# Patient Record
Sex: Female | Born: 1945
Health system: Southern US, Community
[De-identification: ages and names within clinical notes are randomized; demographics above are authoritative.]

## PROBLEM LIST (undated history)

## (undated) DIAGNOSIS — N6459 Other signs and symptoms in breast: Secondary | ICD-10-CM

## (undated) DIAGNOSIS — K5790 Diverticulosis of intestine, part unspecified, without perforation or abscess without bleeding: Secondary | ICD-10-CM

## (undated) DIAGNOSIS — S22020A Wedge compression fracture of second thoracic vertebra, initial encounter for closed fracture: Secondary | ICD-10-CM

## (undated) DIAGNOSIS — Z780 Asymptomatic menopausal state: Secondary | ICD-10-CM

## (undated) DIAGNOSIS — T7840XA Allergy, unspecified, initial encounter: Secondary | ICD-10-CM

## (undated) DIAGNOSIS — R002 Palpitations: Secondary | ICD-10-CM

## (undated) DIAGNOSIS — M519 Unspecified thoracic, thoracolumbar and lumbosacral intervertebral disc disorder: Secondary | ICD-10-CM

## (undated) DIAGNOSIS — S32000A Wedge compression fracture of unspecified lumbar vertebra, initial encounter for closed fracture: Secondary | ICD-10-CM

## (undated) DIAGNOSIS — M810 Age-related osteoporosis without current pathological fracture: Secondary | ICD-10-CM

## (undated) DIAGNOSIS — M1990 Unspecified osteoarthritis, unspecified site: Secondary | ICD-10-CM

## (undated) DIAGNOSIS — K921 Melena: Secondary | ICD-10-CM

## (undated) DIAGNOSIS — E079 Disorder of thyroid, unspecified: Secondary | ICD-10-CM

## (undated) DIAGNOSIS — G5601 Carpal tunnel syndrome, right upper limb: Secondary | ICD-10-CM

## (undated) HISTORY — DX: Palpitations: R00.2

## (undated) HISTORY — DX: Unspecified osteoarthritis, unspecified site: M19.90

## (undated) HISTORY — DX: Other signs and symptoms in breast: N64.59

## (undated) HISTORY — DX: Allergy, unspecified, initial encounter: T78.40XA

## (undated) HISTORY — DX: Asymptomatic menopausal state: Z78.0

## (undated) HISTORY — DX: Carpal tunnel syndrome, right upper limb: G56.01

## (undated) HISTORY — DX: Disorder of thyroid, unspecified: E07.9

## (undated) HISTORY — DX: Age-related osteoporosis without current pathological fracture: M81.0

## (undated) HISTORY — DX: Diverticulosis of intestine, part unspecified, without perforation or abscess without bleeding: K57.90

## (undated) HISTORY — DX: Melena: K92.1

## (undated) HISTORY — DX: Unspecified thoracic, thoracolumbar and lumbosacral intervertebral disc disorder: M51.9

## (undated) HISTORY — PX: OTHER SURGICAL HISTORY: SHX169

---
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
## 1981-08-18 HISTORY — PX: AUGMENTATION MAMMAPLASTY: SUR837

## 1994-08-18 HISTORY — PX: CERVICAL BIOPSY  W/ LOOP ELECTRODE EXCISION: SUR135

## 1998-07-27 ENCOUNTER — Other Ambulatory Visit: Admission: RE | Admit: 1998-07-27 | Discharge: 1998-07-27 | Payer: Self-pay | Admitting: Obstetrics and Gynecology

## 1998-07-30 ENCOUNTER — Other Ambulatory Visit: Admission: RE | Admit: 1998-07-30 | Discharge: 1998-07-30 | Payer: Self-pay | Admitting: Obstetrics and Gynecology

## 1998-08-18 DIAGNOSIS — M519 Unspecified thoracic, thoracolumbar and lumbosacral intervertebral disc disorder: Secondary | ICD-10-CM

## 1998-08-18 HISTORY — DX: Unspecified thoracic, thoracolumbar and lumbosacral intervertebral disc disorder: M51.9

## 1998-12-17 DIAGNOSIS — N6459 Other signs and symptoms in breast: Secondary | ICD-10-CM

## 1998-12-17 HISTORY — DX: Other signs and symptoms in breast: N64.59

## 1999-05-28 ENCOUNTER — Encounter: Admission: RE | Admit: 1999-05-28 | Discharge: 1999-05-28 | Payer: Self-pay | Admitting: Family Medicine

## 1999-06-03 ENCOUNTER — Encounter: Payer: Self-pay | Admitting: Neurosurgery

## 1999-06-03 ENCOUNTER — Ambulatory Visit (HOSPITAL_COMMUNITY): Admission: RE | Admit: 1999-06-03 | Discharge: 1999-06-03 | Payer: Self-pay | Admitting: Neurosurgery

## 1999-06-18 ENCOUNTER — Encounter: Payer: Self-pay | Admitting: Neurosurgery

## 1999-06-18 ENCOUNTER — Ambulatory Visit (HOSPITAL_COMMUNITY): Admission: RE | Admit: 1999-06-18 | Discharge: 1999-06-18 | Payer: Self-pay | Admitting: Neurosurgery

## 1999-07-02 ENCOUNTER — Ambulatory Visit (HOSPITAL_COMMUNITY): Admission: RE | Admit: 1999-07-02 | Discharge: 1999-07-02 | Payer: Self-pay | Admitting: Neurosurgery

## 1999-09-03 ENCOUNTER — Other Ambulatory Visit: Admission: RE | Admit: 1999-09-03 | Discharge: 1999-09-03 | Payer: Self-pay | Admitting: Obstetrics and Gynecology

## 2000-09-08 ENCOUNTER — Other Ambulatory Visit: Admission: RE | Admit: 2000-09-08 | Discharge: 2000-09-08 | Payer: Self-pay | Admitting: Obstetrics and Gynecology

## 2001-08-18 DIAGNOSIS — K5790 Diverticulosis of intestine, part unspecified, without perforation or abscess without bleeding: Secondary | ICD-10-CM

## 2001-08-18 HISTORY — DX: Diverticulosis of intestine, part unspecified, without perforation or abscess without bleeding: K57.90

## 2001-09-08 ENCOUNTER — Other Ambulatory Visit: Admission: RE | Admit: 2001-09-08 | Discharge: 2001-09-08 | Payer: Self-pay | Admitting: Obstetrics and Gynecology

## 2002-06-22 ENCOUNTER — Encounter: Payer: Self-pay | Admitting: Obstetrics and Gynecology

## 2002-06-22 ENCOUNTER — Encounter: Admission: RE | Admit: 2002-06-22 | Discharge: 2002-06-22 | Payer: Self-pay | Admitting: Obstetrics and Gynecology

## 2002-09-07 ENCOUNTER — Other Ambulatory Visit: Admission: RE | Admit: 2002-09-07 | Discharge: 2002-09-07 | Payer: Self-pay | Admitting: Obstetrics and Gynecology

## 2002-09-22 ENCOUNTER — Encounter: Admission: RE | Admit: 2002-09-22 | Discharge: 2002-09-22 | Payer: Self-pay | Admitting: Obstetrics and Gynecology

## 2002-09-22 ENCOUNTER — Encounter: Payer: Self-pay | Admitting: Obstetrics and Gynecology

## 2003-02-15 ENCOUNTER — Encounter: Payer: Self-pay | Admitting: Obstetrics and Gynecology

## 2003-02-15 ENCOUNTER — Encounter: Admission: RE | Admit: 2003-02-15 | Discharge: 2003-02-15 | Payer: Self-pay | Admitting: Obstetrics and Gynecology

## 2003-08-29 ENCOUNTER — Encounter: Admission: RE | Admit: 2003-08-29 | Discharge: 2003-08-29 | Payer: Self-pay | Admitting: Obstetrics and Gynecology

## 2003-09-08 ENCOUNTER — Other Ambulatory Visit: Admission: RE | Admit: 2003-09-08 | Discharge: 2003-09-08 | Payer: Self-pay | Admitting: Obstetrics and Gynecology

## 2004-03-13 ENCOUNTER — Encounter: Admission: RE | Admit: 2004-03-13 | Discharge: 2004-03-13 | Payer: Self-pay | Admitting: Obstetrics and Gynecology

## 2004-09-27 ENCOUNTER — Other Ambulatory Visit: Admission: RE | Admit: 2004-09-27 | Discharge: 2004-09-27 | Payer: Self-pay | Admitting: Obstetrics and Gynecology

## 2005-03-25 ENCOUNTER — Encounter: Admission: RE | Admit: 2005-03-25 | Discharge: 2005-03-25 | Payer: Self-pay | Admitting: Obstetrics and Gynecology

## 2005-08-18 LAB — HM COLONOSCOPY: HM Colonoscopy: 1

## 2005-11-07 ENCOUNTER — Other Ambulatory Visit: Admission: RE | Admit: 2005-11-07 | Discharge: 2005-11-07 | Payer: Self-pay | Admitting: Obstetrics and Gynecology

## 2006-03-27 ENCOUNTER — Encounter: Admission: RE | Admit: 2006-03-27 | Discharge: 2006-03-27 | Payer: Self-pay | Admitting: Obstetrics and Gynecology

## 2006-03-27 ENCOUNTER — Encounter: Payer: Self-pay | Admitting: Internal Medicine

## 2006-11-11 ENCOUNTER — Other Ambulatory Visit: Admission: RE | Admit: 2006-11-11 | Discharge: 2006-11-11 | Payer: Self-pay | Admitting: Obstetrics and Gynecology

## 2006-11-25 ENCOUNTER — Ambulatory Visit: Payer: Self-pay | Admitting: Internal Medicine

## 2006-12-11 ENCOUNTER — Encounter: Payer: Self-pay | Admitting: Internal Medicine

## 2006-12-11 ENCOUNTER — Ambulatory Visit: Payer: Self-pay | Admitting: Internal Medicine

## 2006-12-11 ENCOUNTER — Encounter (INDEPENDENT_AMBULATORY_CARE_PROVIDER_SITE_OTHER): Payer: Self-pay | Admitting: Specialist

## 2007-04-12 ENCOUNTER — Encounter: Payer: Self-pay | Admitting: Internal Medicine

## 2007-04-12 ENCOUNTER — Encounter: Admission: RE | Admit: 2007-04-12 | Discharge: 2007-04-12 | Payer: Self-pay | Admitting: Obstetrics and Gynecology

## 2007-08-19 HISTORY — PX: OTHER SURGICAL HISTORY: SHX169

## 2007-11-29 ENCOUNTER — Other Ambulatory Visit: Admission: RE | Admit: 2007-11-29 | Discharge: 2007-11-29 | Payer: Self-pay | Admitting: Obstetrics and Gynecology

## 2008-01-19 ENCOUNTER — Other Ambulatory Visit: Admission: RE | Admit: 2008-01-19 | Discharge: 2008-01-19 | Payer: Self-pay | Admitting: Family Medicine

## 2008-03-15 ENCOUNTER — Encounter: Payer: Self-pay | Admitting: Internal Medicine

## 2008-03-16 ENCOUNTER — Emergency Department (HOSPITAL_BASED_OUTPATIENT_CLINIC_OR_DEPARTMENT_OTHER): Admission: EM | Admit: 2008-03-16 | Discharge: 2008-03-16 | Payer: Self-pay | Admitting: Emergency Medicine

## 2008-03-21 ENCOUNTER — Emergency Department (HOSPITAL_BASED_OUTPATIENT_CLINIC_OR_DEPARTMENT_OTHER): Admission: EM | Admit: 2008-03-21 | Discharge: 2008-03-21 | Payer: Self-pay | Admitting: Emergency Medicine

## 2008-04-17 ENCOUNTER — Encounter: Admission: RE | Admit: 2008-04-17 | Discharge: 2008-04-17 | Payer: Self-pay | Admitting: Obstetrics and Gynecology

## 2008-04-17 ENCOUNTER — Encounter: Payer: Self-pay | Admitting: Internal Medicine

## 2008-08-03 ENCOUNTER — Emergency Department (HOSPITAL_BASED_OUTPATIENT_CLINIC_OR_DEPARTMENT_OTHER): Admission: EM | Admit: 2008-08-03 | Discharge: 2008-08-03 | Payer: Self-pay | Admitting: Emergency Medicine

## 2008-08-03 ENCOUNTER — Ambulatory Visit: Payer: Self-pay | Admitting: Interventional Radiology

## 2008-08-18 HISTORY — PX: BREAST SURGERY: SHX581

## 2008-09-06 ENCOUNTER — Ambulatory Visit (HOSPITAL_COMMUNITY): Admission: RE | Admit: 2008-09-06 | Discharge: 2008-09-06 | Payer: Self-pay | Admitting: Orthopedic Surgery

## 2008-11-29 ENCOUNTER — Other Ambulatory Visit: Admission: RE | Admit: 2008-11-29 | Discharge: 2008-11-29 | Payer: Self-pay | Admitting: Obstetrics and Gynecology

## 2009-04-18 ENCOUNTER — Encounter: Admission: RE | Admit: 2009-04-18 | Discharge: 2009-04-18 | Payer: Self-pay | Admitting: Obstetrics and Gynecology

## 2009-04-18 ENCOUNTER — Encounter: Payer: Self-pay | Admitting: Internal Medicine

## 2009-11-24 ENCOUNTER — Encounter: Admission: RE | Admit: 2009-11-24 | Discharge: 2009-11-24 | Payer: Self-pay | Admitting: Orthopedic Surgery

## 2009-12-04 LAB — CONVERTED CEMR LAB: Pap Smear: NORMAL

## 2010-01-23 ENCOUNTER — Ambulatory Visit (HOSPITAL_BASED_OUTPATIENT_CLINIC_OR_DEPARTMENT_OTHER): Admission: RE | Admit: 2010-01-23 | Discharge: 2010-01-23 | Payer: Self-pay | Admitting: Orthopedic Surgery

## 2010-02-20 ENCOUNTER — Encounter: Admission: RE | Admit: 2010-02-20 | Discharge: 2010-02-28 | Payer: Self-pay | Admitting: Orthopedic Surgery

## 2010-03-15 ENCOUNTER — Ambulatory Visit: Payer: Self-pay | Admitting: Internal Medicine

## 2010-03-15 DIAGNOSIS — M949 Disorder of cartilage, unspecified: Secondary | ICD-10-CM

## 2010-03-15 DIAGNOSIS — M899 Disorder of bone, unspecified: Secondary | ICD-10-CM | POA: Insufficient documentation

## 2010-03-18 ENCOUNTER — Encounter: Payer: Self-pay | Admitting: Internal Medicine

## 2010-04-03 ENCOUNTER — Encounter: Payer: Self-pay | Admitting: Internal Medicine

## 2010-05-03 ENCOUNTER — Encounter: Admission: RE | Admit: 2010-05-03 | Discharge: 2010-05-03 | Payer: Self-pay | Admitting: Obstetrics and Gynecology

## 2010-07-19 ENCOUNTER — Ambulatory Visit: Payer: Self-pay | Admitting: Internal Medicine

## 2010-07-30 ENCOUNTER — Encounter
Admission: RE | Admit: 2010-07-30 | Discharge: 2010-08-13 | Payer: Self-pay | Source: Home / Self Care | Attending: Specialist | Admitting: Specialist

## 2010-07-30 ENCOUNTER — Ambulatory Visit: Payer: Self-pay | Admitting: Internal Medicine

## 2010-07-30 DIAGNOSIS — H6980 Other specified disorders of Eustachian tube, unspecified ear: Secondary | ICD-10-CM | POA: Insufficient documentation

## 2010-08-13 ENCOUNTER — Telehealth: Payer: Self-pay | Admitting: Internal Medicine

## 2010-08-16 ENCOUNTER — Telehealth: Payer: Self-pay | Admitting: Internal Medicine

## 2010-08-16 ENCOUNTER — Ambulatory Visit (HOSPITAL_BASED_OUTPATIENT_CLINIC_OR_DEPARTMENT_OTHER)
Admission: RE | Admit: 2010-08-16 | Discharge: 2010-08-16 | Payer: Self-pay | Source: Home / Self Care | Attending: Internal Medicine | Admitting: Internal Medicine

## 2010-08-16 ENCOUNTER — Telehealth: Payer: Self-pay | Admitting: Family

## 2010-08-16 ENCOUNTER — Ambulatory Visit
Admission: RE | Admit: 2010-08-16 | Discharge: 2010-08-16 | Payer: Self-pay | Source: Home / Self Care | Attending: Family | Admitting: Family

## 2010-08-16 DIAGNOSIS — R05 Cough: Secondary | ICD-10-CM

## 2010-08-18 HISTORY — PX: KNEE SURGERY: SHX244

## 2010-09-08 ENCOUNTER — Encounter: Payer: Self-pay | Admitting: Obstetrics and Gynecology

## 2010-09-17 NOTE — Miscellaneous (Signed)
Summary: Bone Density Results  Clinical Lists Changes  Observations: Added new observation of BONE DENSITY: Osteopenia (03/18/2010 15:11)      Preventive Care Screening  Bone Density:    Date:  03/18/2010    Results:  Osteopenia

## 2010-09-17 NOTE — Assessment & Plan Note (Signed)
Summary: TO EST  /CPX/HEA--Rm 2   Vital Signs:  Patient profile:   65 year old female Height:      67.5 inches Weight:      155 pounds BMI:     24.00 O2 Sat:      100 % on Room air Temp:     97.7 degrees F oral Pulse rate:   70 / minute Pulse rhythm:   regular Resp:     16 per minute BP sitting:   118 / 80  (right arm) Cuff size:   regular  Vitals Entered By: [name] CMA (March 15, 2010 9:20 AM)  O2 Flow:  Room air CC: Rm 2--New Patient  Is Patient Diabetic? No Pain Assessment Patient in pain? no       Does patient need assistance? Functional Status Self care Ambulation Normal   Primary Care Provider:  [name]  CC:  Rm 2--New Patient .  History of Present Illness: 65 y/o female to establish and for routine cpx.  Dr. Janey Greaser was prev PCP on HRT - since age 75  she reports hx of fluid in right ear.   prev saw ENT and had tube placed.  no follow up   Preventive Screening-Counseling & Management  Alcohol-Tobacco     Alcohol drinks/day: 2     Alcohol type: wine     Smoking Status: quit     Packs/Day: 0.5     Year Started: 1967     Year Quit: 1980  Caffeine-Diet-Exercise     Caffeine use/day: 2 beverages daily     Does Patient Exercise: no     Times/week: 5  Allergies (verified): No Known Drug Allergies  Past History:  Past Medical History: L4 vertebrae compression fx - in her 40's L2 vertebral compression fx 2009 Osteopenia - prev on fosamax (not sure how long she took fosamax) stopped fosamax - 4-5 yrs ago    Past Surgical History: Bilateral breast implants removed 2010 Right knee repair 01/23/2010  - meniscal repair Tonsillectomy    Family History: Family History of Arthritis Family History of CAD Female 1st degree relative <50 Family History of Colon CA 1st degree relative <60 Family History High cholesterol Family History Hypertension mother - hx of alzheimer, osteoporosis sister - breast ca    Social History: Retired -  from [employer] Married- 24 years  (Husband - Vrinda Heckstall) no children- 2 step daughters originally from  - [place], [place] prev lived [place] prev smoker -  (on and off) 10 yrs  alcohol- 2 glasses of wine per day   Smoking Status:  quit Packs/Day:  0.5 Caffeine use/day:  2 beverages daily Does Patient Exercise:  no  Review of Systems  The patient denies fever, weight loss, weight gain, chest pain, syncope, dyspnea on exertion, prolonged cough, abdominal pain, melena, hematochezia, severe indigestion/heartburn, and depression.    Physical Exam  General:  alert, well-developed, and well-nourished.   Head:  normocephalic and atraumatic.   Ears:  right ear tube, right cerumen  Mouth:  pharynx pink and moist.   Neck:  No deformities, masses, or tenderness noted. Lungs:  normal respiratory effort, normal breath sounds, no crackles, and no wheezes.   Heart:  normal rate, regular rhythm, and no gallop.   Abdomen:  soft, non-tender, normal bowel sounds, no masses, no hepatomegaly, and no splenomegaly.   Extremities:  No lower extremity edema Neurologic:  cranial nerves II-XII intact and gait normal.   Psych:  normally  interactive, good eye contact, not anxious appearing, and not depressed appearing.     Impression & Recommendations:  Problem # 1:  HEALTH MAINTENANCE EXAM (ICD-V70.0) Reviewed adult health maintenance protocols.  Mammogram: normal (04/18/2009) Pap smear: normal (12/04/2009) Colonoscopy: abnormal (12/11/2006) Bone Density: normal (03/15/2008) Td Booster: Historical (03/16/2008)   Flu Vax: Historical (07/03/2009)     Problem # 2:  OSTEOPENIA (ICD-733.90)  Complete Medication List: 1)  Prempro 0.625-2.5 Mg Tabs (Conj estrog-medroxyprogest ace) .... Take 1 tablet by mouth once a day 2)  Ibuprofen 200 Mg Tabs (Ibuprofen) .... 3 tablets by mouth two times a day as needed knee pain 3)  Calcium-vitamin D3 600-200 Mg-unit Tabs (Calcium carbonate-vitamin d) ....  Take 1 tablet by mouth two times a day 4)  Centrum Tabs (Multiple vitamins-minerals) .... Take 1 tablet by mouth once a day 5)  Vitamin D3 2000 Unit Tabs (Cholecalciferol) .... Take 1 tablet by mouth once a day  Patient Instructions: 1)  Please schedule a follow-up appointment in 1 year.   Preventive Care Screening  Pap Smear:    Date:  12/04/2009    Results:  normal   Mammogram:    Date:  04/18/2009    Results:  normal   Last Tetanus Booster:    Date:  03/16/2008    Results:  Historical   Bone Density:    Date:  03/15/2008    Results:  normal std dev  Colonoscopy:    Date:  12/11/2006    Results:  abnormal     Immunization History:  Zostavax History:    Zostavax # 1:  zostavax (12/30/2005)  Influenza Immunization History:    Influenza:  historical (07/03/2009)    Current Allergies (reviewed today): No known allergies

## 2010-09-17 NOTE — Procedures (Signed)
Summary: Colonoscopy Report/[facility]  Colonoscopy Report/[facility]   Imported By: Maryln Gottron 03/26/2010 13:34:04  _____________________________________________________________________  External Attachment:    Type:   Image     Comment:   External Document

## 2010-09-19 NOTE — Assessment & Plan Note (Signed)
Summary: Ear feels full /hea   Vital Signs:  Patient profile:   65 year old female Weight:      157 pounds BMI:     24.31 O2 Sat:      100 % on Room air Temp:     98.1 degrees F oral Pulse rate:   72 / minute Resp:     18 per minute BP sitting:   120 / 80  (left arm) Cuff size:   regular  Vitals Entered By: [name] CMA (July 30, 2010 11:41 AM)  O2 Flow:  Room air CC: Sinus Congestion Is Patient Diabetic? No Pain Assessment Patient in pain? no      Comments c/o right ear pressure, sinus congestion, feels better, still has throat irritation from nasal drainage , denies temp, and states she has not felt feverish   Primary Care Provider:  [name]  CC:  Sinus Congestion.  History of Present Illness: 65 y/o white female for f/u recent cough and congestion is much better but has residual right ear sense of fullness prev his of chronic right ear infections seen by Dr. Jenne Pane in the past - tried tubes  Preventive Screening-Counseling & Management  Alcohol-Tobacco     Smoking Status: quit  Allergies (verified): No Known Drug Allergies  Past History:  Past Medical History: L4 vertebrae compression fx - in her 40's L2 vertebral compression fx 2009 Osteopenia - prev on fosamax (not sure how long she took fosamax) stopped fosamax - 4-5 yrs ago    Hx of chronic right ear infection - Dr. Jenne Pane  Past Surgical History: Bilateral breast implants removed 2010 Right knee repair 01/23/2010  - meniscal repair Tonsillectomy      Family History: Family History of Arthritis Family History of CAD Female 1st degree relative <50 Family History of Colon CA 1st degree relative <60 Family History High cholesterol Family History Hypertension mother - hx of alzheimer, osteoporosis sister - breast ca     Social History: Retired - from [employer] Married- 24 years  (Husband - Ann Vang) no children- 2 step daughters originally from  - [place],  [place] prev lived [place] prev smoker -  (on and off) 10 yrs  alcohol- 2 glasses of wine per day     Physical Exam  General:  alert, well-developed, and well-nourished.   Eyes:  pupils equal, pupils round, and pupils reactive to light.   Ears:  right TM dull, scarred and retracted Lungs:  normal respiratory effort, normal breath sounds, no crackles, and no wheezes.   Heart:  normal rate, regular rhythm, and no gallop.     Impression & Recommendations:  Problem # 1:  DYSFUNCTION OF EUSTACHIAN TUBE (ICD-381.81) use nose sprays and decongestants x 2 weeks she declines referral back to ENT   she has some hearing loss of right ear she will consider hearing aide if hearing gets worse but left ear hearing seems to be normal  Complete Medication List: 1)  Prempro 0.625-2.5 Mg Tabs (Conj estrog-medroxyprogest ace) .... Take 1 tablet by mouth once a day 2)  Calcium-vitamin D3 600-200 Mg-unit Tabs (Calcium carbonate-vitamin d) .... Take 1 tablet by mouth two times a day 3)  Centrum Tabs (Multiple vitamins-minerals) .... Take 1 tablet by mouth once a day 4)  Vitamin D3 2000 Unit Tabs (Cholecalciferol) .... Take 1 tablet by mouth once a day 5)  Hydrocodone-homatropine 5-1.5 Mg/39ml Syrp (Hydrocodone-homatropine) .... 5 ml by mouth two times a day as needed for  cough 6)  Fluticasone Propionate 50 Mcg/act Susp (Fluticasone propionate) .... 2 sprays each nostril once daily 7)  Azelastine Hcl 137 Mcg/spray Soln (Azelastine hcl) .... 2 sprays each nostril two times a day  Patient Instructions: 1)  Call our office if your symptoms do not  improve or gets worse. Prescriptions: AZELASTINE HCL 137 MCG/SPRAY SOLN (AZELASTINE HCL) 2 sprays each nostril two times a day  #1 x 3   Entered and Authorized by:   [name]   Signed by:   [name] on 07/30/2010   Method used:   Electronically to        [id number] (retail)       [address] [place], [place]  [address]       Ph: [phone]       Fax: [phone]   RxID:   [id number] FLUTICASONE PROPIONATE 50 MCG/ACT SUSP (FLUTICASONE PROPIONATE) 2 sprays each nostril once daily  #1 x 3   Entered and Authorized by:   [name]   Signed by:   [name] on 07/30/2010   Method used:   Electronically to        [id number] (retail)       [address] [place], [place]  [address]       Ph: [phone]       Fax: [phone]   RxID:   [id number]    Orders Added: 1)  Est. Patient Level III [88416]    Current Allergies (reviewed today): No known allergies

## 2010-09-19 NOTE — Progress Notes (Signed)
Summary: Unresolved Cough  Phone Note Call from Patient Call back at Home Phone [phone]   Caller: Patient Call For: [name] Summary of Call: patient called and left voice message stating she still has a sore throat and couhg, she would like to know if she could be seen this am by [facility].  Call was returned to patient at [phone], she states she still has a cough, congestion and sore throat, and feels she need to be seen. She has been provided a appointment for 10:30 this am Initial call taken by: [name] CMA,  August 16, 2010 8:36 AM

## 2010-09-19 NOTE — Progress Notes (Signed)
Summary: CXR results  Phone Note Outgoing Call   Summary of Call: Please call patient and notify her that her chest x-ray is negative. Initial call taken by: Lemont Fillers FNP,  August 16, 2010 11:44 AM  Follow-up for Phone Call        call placed to patient at [phone], no answer. A detailed voice message was left informing patient per [facility] instructions Follow-up by: [name] CMA,  August 16, 2010 11:57 AM

## 2010-09-19 NOTE — Assessment & Plan Note (Signed)
Summary: COUGH CONGESTION/M[facility]   Vital Signs:  Patient profile:   65 year old female Height:      67.5 inches Weight:      157.50 pounds BMI:     24.39 O2 Sat:      100 % on Room air Temp:     98.0 degrees F oral Pulse rate:   78 / minute Resp:     18 per minute BP sitting:   124 / 70  (right arm) Cuff size:   regular  Vitals Entered By: [name] CMA (July 19, 2010 1:57 PM)  O2 Flow:  Room air CC: Chest congestion/ Cough Is Patient Diabetic? No Pain Assessment Patient in pain? no        Primary Care Henretta Quist:  [name]  CC:  Chest congestion/ Cough.  History of Present Illness: onset about 10 days ago with sore throat , right ear congestion, took claritan D fo rthe past 7 days, 2 days ago onset  of cough productive green in color, cough is worse at night- not sure if allergy related, has not checked temp and did not feel feverish, intermittent headache, exhaustion throughout day  Preventive Screening-Counseling & Management  Alcohol-Tobacco     Smoking Status: quit  Allergies (verified): No Known Drug Allergies  Past History:  Past Medical History: L4 vertebrae compression fx - in her 40's L2 vertebral compression fx 2009 Osteopenia - prev on fosamax (not sure how long she took fosamax) stopped fosamax - 4-5 yrs ago     Physical Exam  General:  alert, well-developed, and well-nourished.   Ears:  R ear normal and L ear normal.   Mouth:  pharyngeal erythema.   Lungs:  normal respiratory effort and slight coarse breath sounds Heart:  normal rate, regular rhythm, and no gallop.     Impression & Recommendations:  Problem # 1:  BRONCHITIS, ACUTE (ICD-466.0)  Her updated medication list for this problem includes:    Hydrocodone-homatropine 5-1.5 Mg/31ml Syrp (Hydrocodone-homatropine) .Marland KitchenMarland KitchenMarland KitchenMarland Kitchen 5 ml by mouth two times a day as needed for cough  Take antibiotics and other medications as directed. Encouraged to push clear liquids, get enough rest,  and take acetaminophen as needed. To be seen in 5-7 days if no improvement, sooner if worse.  Complete Medication List: 1)  Prempro 0.625-2.5 Mg Tabs (Conj estrog-medroxyprogest ace) .... Take 1 tablet by mouth once a day 2)  Calcium-vitamin D3 600-200 Mg-unit Tabs (Calcium carbonate-vitamin d) .... Take 1 tablet by mouth two times a day 3)  Centrum Tabs (Multiple vitamins-minerals) .... Take 1 tablet by mouth once a day 4)  Vitamin D3 2000 Unit Tabs (Cholecalciferol) .... Take 1 tablet by mouth once a day 5)  Hydrocodone-homatropine 5-1.5 Mg/45ml Syrp (Hydrocodone-homatropine) .... 5 ml by mouth two times a day as needed for cough  Patient Instructions: 1)  Call our office if your symptoms do not  improve or gets worse. Prescriptions: HYDROCODONE-HOMATROPINE 5-1.5 MG/5ML SYRP (HYDROCODONE-HOMATROPINE) 5 ml by mouth two times a day as needed for cough  #90 ml x 0   Entered and Authorized by:   [name]   Signed by:   [name] on 07/19/2010   Method used:   Print then Give to Patient   RxID:   [id number] PREDNISONE 10 MG TABS (PREDNISONE) 3 tabs by mouth once daily x 3 days, 2 tabs by mouth once daily x 3 days, 1 tab by mouth once daily x 3 days  #  18 x 0   Entered and Authorized by:   [name]   Signed by:   [name] on 07/19/2010   Method used:   Electronically to        [id number] (retail)       [address] [place], [place]  [address]       Ph: [phone]       Fax: [phone]   RxID:   [id number] CEFUROXIME AXETIL 500 MG TABS (CEFUROXIME AXETIL) one by mouth two times a day  #14 x 0   Entered and Authorized by:   [name]   Signed by:   [name] on 07/19/2010   Method used:   Electronically to        [id number] (retail)       [address] [place], [place]  [address]       Ph: [phone]       Fax: [phone]   RxID:    [id number]    Orders Added: 1)  Est. Patient Level III [88416]    Current Allergies (reviewed today): No known allergies

## 2010-09-19 NOTE — Assessment & Plan Note (Signed)
Summary: COUGH & CONGESTION/DK--Rm 3   Vital Signs:  Patient profile:   65 year old female Height:      67.5 inches Weight:      157.25 pounds BMI:     24.35 O2 Sat:      100 % on Room air Temp:     97.9 degrees F oral Pulse rate:   78 / minute Pulse rhythm:   regular Resp:     18 per minute BP sitting:   122 / 88  (right arm) Cuff size:   regular  Vitals Entered By: Mervin Kung CMA Duncan Dull) (August 16, 2010 10:25 AM)  O2 Flow:  Room air CC: Pt states she has had burning in her mouth and throat x 1 day. Cough x 1 month and getting worse. Is Patient Diabetic? No Pain Assessment Patient in pain? no      Comments Pt has right ear fullness and intermittent pain. Pt has completed Hydrocodone-Homatropine. Pt agrees all other med doses and directions are correct. Nicki Guadalajara Fergerson CMA Duncan Dull)  August 16, 2010 10:33 AM    Primary Care Provider:  [name]  CC:  Pt states she has had burning in her mouth and throat x 1 day. Cough x 1 month and getting worse.Marland Kitchen  History of Present Illness: Ms.  Fitzhenry is a 65 year old female who presents with chief complaint of cough.   Symptoms started 1 month ago.  Initially treated with cephalosporin which helped a bit with her cough.   Cough is dry. Denies associated fever. Cough is improved by sitting upright, worsened by nothing.  Notes that her mouth and throat was burning yesterday- also had a HA.    Allergies (verified): No Known Drug Allergies  Past History:  Past Medical History: Last updated: 07/30/2010 L4 vertebrae compression fx - in her 40's L2 vertebral compression fx 2009 Osteopenia - prev on fosamax (not sure how long she took fosamax) stopped fosamax - 4-5 yrs ago    Hx of chronic right ear infection - Dr. Jenne Pane  Past Surgical History: Last updated: 07/30/2010 Bilateral breast implants removed 2010 Right knee repair 01/23/2010  - meniscal repair Tonsillectomy      Review of Systems       see  HPI  Physical Exam  General:  Well-developed,well-nourished,in no acute distress; alert,appropriate and cooperative throughout examination, dry cough noted, clearing throat frequently Head:  Normocephalic and atraumatic without obvious abnormalities. No apparent alopecia or balding. Eyes:  PERRLA Ears:  R TM very retracted/dull Mouth:  Oral mucosa and oropharynx without lesions or exudates.  Teeth in good repair. Neck:  No deformities, masses, or tenderness noted. Lungs:  Normal respiratory effort, chest expands symmetrically. Lungs are clear to auscultation, no crackles or wheezes. Heart:  Normal rate and regular rhythm. S1 and S2 normal without gallop, murmur, click, rub or other extra sounds.   Impression & Recommendations:  Problem # 1:  COUGH (ICD-786.2) Assessment Unchanged Suspect that this is due to chronic post nasal drip.  Will check CXR given patient's smoking history and duration of cough.  Nasal discharge is clear and not accompanied by pain/pressure or fever.  Continues to use flonase and astelin and zyrtec D.   Recommend that we  continue conservative measures for now.  No clear indication for abx at this time, however pt was instructed to call if symptoms worsen or do not improve.  Add tessalon as needed for cough symptoms.  Orders: CXR- 2view (CXR)  Complete  Medication List: 1)  Prempro 0.625-2.5 Mg Tabs (Conj estrog-medroxyprogest ace) .... Take 1 tablet by mouth once a day 2)  Calcium-vitamin D3 600-200 Mg-unit Tabs (Calcium carbonate-vitamin d) .... Take 1 tablet by mouth two times a day 3)  Centrum Tabs (Multiple vitamins-minerals) .... Take 1 tablet by mouth once a day 4)  Vitamin D3 2000 Unit Tabs (Cholecalciferol) .... Take 1 tablet by mouth once a day 5)  Fluticasone Propionate 50 Mcg/act Susp (Fluticasone propionate) .... 2 sprays each nostril once daily 6)  Azelastine Hcl 137 Mcg/spray Soln (Azelastine hcl) .... 2 sprays each nostril two times a day 7)   Tessalon Perles 100 Mg Caps (Benzonatate) .... One cap by mouth three times a day as needed for cough  Other Orders: Rapid Strep (81[address])  Patient Instructions: 1)  Call if you develop fever, worseing sinus pain, if cough worsens, or if no improvement in next 1 week.   Prescriptions: TESSALON PERLES 100 MG CAPS (BENZONATATE) one cap by mouth three times a day as needed for cough  #30 x 0   Entered and Authorized by:   Lemont Fillers FNP   Signed by:   Lemont Fillers FNP on 08/16/2010   Method used:   Electronically to        [facility] [phone][facility] (retail)       [address] [place], [place]  [address]       Ph: [phone]       Fax: [phone]   RxID:   [id number]    Orders Added: 1)  Rapid Strep [34742] 2)  CXR- 2view [CXR] 3)  Est. Patient Level III [59563]    Current Allergies (reviewed today): No known allergies

## 2010-09-19 NOTE — Progress Notes (Signed)
Summary: Hydromet  Phone Note Call from Patient Call back at Home Phone [phone]   Caller: Patient Call For: [name] Summary of Call: patient called and left voice message stating she was given a rx for Hydromet cough syrup, and would like to know if she could get a refill on the medication. Her message states she still has a cough that bothers her mainly at night.  Initial call taken by: [name] CMA,  August 13, 2010 11:15 AM  Follow-up for Phone Call        I would recommend trial of Delsym OTC.  If this does not improve her cough, she should be re-evaluated in the office.   Follow-up by: Lemont Fillers FNP,  August 13, 2010 11:28 AM  Additional Follow-up for Phone Call Additional follow up Details #1::        call returned to patient at [phone], no answer. A detailed voice message was left informing patient per [facility] instructions. Message was left for patient to return call if any questions Additional Follow-up by: [name] CMA,  August 13, 2010 11:32 AM

## 2010-11-04 LAB — POCT HEMOGLOBIN-HEMACUE: Hemoglobin: 13.9 g/dL (ref 12.0–15.0)

## 2011-02-20 ENCOUNTER — Ambulatory Visit: Payer: Self-pay | Admitting: Internal Medicine

## 2011-02-21 ENCOUNTER — Other Ambulatory Visit: Payer: Self-pay | Admitting: Obstetrics and Gynecology

## 2011-02-21 DIAGNOSIS — N644 Mastodynia: Secondary | ICD-10-CM

## 2011-02-27 ENCOUNTER — Ambulatory Visit
Admission: RE | Admit: 2011-02-27 | Discharge: 2011-02-27 | Disposition: A | Discharge status: Home / Self Care | Payer: BC Managed Care – PPO | Source: Ambulatory Visit | Attending: Obstetrics and Gynecology | Admitting: Obstetrics and Gynecology

## 2011-02-27 DIAGNOSIS — N644 Mastodynia: Secondary | ICD-10-CM

## 2011-03-04 ENCOUNTER — Other Ambulatory Visit: Payer: Self-pay | Admitting: Obstetrics and Gynecology

## 2011-03-04 DIAGNOSIS — N6489 Other specified disorders of breast: Secondary | ICD-10-CM

## 2011-03-04 DIAGNOSIS — N6320 Unspecified lump in the left breast, unspecified quadrant: Secondary | ICD-10-CM

## 2011-03-04 DIAGNOSIS — Z86018 Personal history of other benign neoplasm: Secondary | ICD-10-CM

## 2011-03-04 DIAGNOSIS — N63 Unspecified lump in unspecified breast: Secondary | ICD-10-CM

## 2011-03-19 ENCOUNTER — Ambulatory Visit
Admission: RE | Admit: 2011-03-19 | Discharge: 2011-03-19 | Disposition: A | Discharge status: Home / Self Care | Payer: BC Managed Care – PPO | Source: Ambulatory Visit | Attending: Obstetrics and Gynecology | Admitting: Obstetrics and Gynecology

## 2011-03-19 DIAGNOSIS — N6320 Unspecified lump in the left breast, unspecified quadrant: Secondary | ICD-10-CM

## 2011-03-19 DIAGNOSIS — Z86018 Personal history of other benign neoplasm: Secondary | ICD-10-CM

## 2011-03-19 MED ORDER — GADOBENATE DIMEGLUMINE 529 MG/ML IV SOLN: 14.0000 mL | Freq: Once | INTRAVENOUS | Status: AC | PRN

## 2011-05-05 ENCOUNTER — Ambulatory Visit
Admission: RE | Admit: 2011-05-05 | Discharge: 2011-05-05 | Disposition: A | Discharge status: Home / Self Care | Payer: BC Managed Care – PPO | Source: Ambulatory Visit | Attending: Obstetrics and Gynecology | Admitting: Obstetrics and Gynecology

## 2011-05-05 ENCOUNTER — Other Ambulatory Visit: Payer: Self-pay | Admitting: Obstetrics and Gynecology

## 2011-05-05 DIAGNOSIS — N63 Unspecified lump in unspecified breast: Secondary | ICD-10-CM

## 2011-12-13 DIAGNOSIS — M8448XA Pathological fracture, other site, initial encounter for fracture: Secondary | ICD-10-CM | POA: Diagnosis not present

## 2011-12-19 DIAGNOSIS — M810 Age-related osteoporosis without current pathological fracture: Secondary | ICD-10-CM | POA: Diagnosis not present

## 2011-12-19 DIAGNOSIS — Z124 Encounter for screening for malignant neoplasm of cervix: Secondary | ICD-10-CM | POA: Diagnosis not present

## 2011-12-19 DIAGNOSIS — Z01419 Encounter for gynecological examination (general) (routine) without abnormal findings: Secondary | ICD-10-CM | POA: Diagnosis not present

## 2011-12-19 DIAGNOSIS — IMO0002 Reserved for concepts with insufficient information to code with codable children: Secondary | ICD-10-CM | POA: Diagnosis not present

## 2012-04-05 DIAGNOSIS — M779 Enthesopathy, unspecified: Secondary | ICD-10-CM | POA: Diagnosis not present

## 2012-04-05 DIAGNOSIS — M7150 Other bursitis, not elsewhere classified, unspecified site: Secondary | ICD-10-CM | POA: Diagnosis not present

## 2012-04-05 DIAGNOSIS — M216X9 Other acquired deformities of unspecified foot: Secondary | ICD-10-CM | POA: Diagnosis not present

## 2012-04-05 DIAGNOSIS — M79609 Pain in unspecified limb: Secondary | ICD-10-CM | POA: Diagnosis not present

## 2012-04-06 ENCOUNTER — Other Ambulatory Visit: Payer: Self-pay | Admitting: Obstetrics and Gynecology

## 2012-04-06 DIAGNOSIS — Z1231 Encounter for screening mammogram for malignant neoplasm of breast: Secondary | ICD-10-CM

## 2012-04-14 DIAGNOSIS — M949 Disorder of cartilage, unspecified: Secondary | ICD-10-CM | POA: Diagnosis not present

## 2012-04-14 DIAGNOSIS — M899 Disorder of bone, unspecified: Secondary | ICD-10-CM | POA: Diagnosis not present

## 2012-04-14 DIAGNOSIS — Z8262 Family history of osteoporosis: Secondary | ICD-10-CM | POA: Diagnosis not present

## 2012-04-14 LAB — HM DEXA SCAN

## 2012-05-06 DIAGNOSIS — M79609 Pain in unspecified limb: Secondary | ICD-10-CM | POA: Diagnosis not present

## 2012-05-10 ENCOUNTER — Ambulatory Visit
Admission: RE | Admit: 2012-05-10 | Discharge: 2012-05-10 | Disposition: A | Discharge status: Home / Self Care | Payer: Medicare Other | Source: Ambulatory Visit | Attending: Obstetrics and Gynecology | Admitting: Obstetrics and Gynecology

## 2012-05-10 DIAGNOSIS — Z1231 Encounter for screening mammogram for malignant neoplasm of breast: Secondary | ICD-10-CM

## 2012-05-10 LAB — HM MAMMOGRAPHY

## 2012-05-31 DIAGNOSIS — M2040 Other hammer toe(s) (acquired), unspecified foot: Secondary | ICD-10-CM | POA: Diagnosis not present

## 2012-07-13 ENCOUNTER — Encounter: Payer: Self-pay | Admitting: Internal Medicine

## 2012-07-13 ENCOUNTER — Ambulatory Visit (INDEPENDENT_AMBULATORY_CARE_PROVIDER_SITE_OTHER): Payer: Medicare Other | Admitting: Internal Medicine

## 2012-07-13 VITALS — BP 122/76 | HR 83 | Temp 98.1°F | Resp 14 | Ht 67.0 in | Wt 158.0 lb

## 2012-07-13 DIAGNOSIS — S32009A Unspecified fracture of unspecified lumbar vertebra, initial encounter for closed fracture: Secondary | ICD-10-CM

## 2012-07-13 DIAGNOSIS — S32000A Wedge compression fracture of unspecified lumbar vertebra, initial encounter for closed fracture: Secondary | ICD-10-CM

## 2012-07-13 DIAGNOSIS — H938X9 Other specified disorders of ear, unspecified ear: Secondary | ICD-10-CM

## 2012-07-13 DIAGNOSIS — M899 Disorder of bone, unspecified: Secondary | ICD-10-CM

## 2012-07-13 DIAGNOSIS — M949 Disorder of cartilage, unspecified: Secondary | ICD-10-CM | POA: Diagnosis not present

## 2012-07-13 HISTORY — DX: Wedge compression fracture of unspecified lumbar vertebra, initial encounter for closed fracture: S32.000A

## 2012-07-13 MED ORDER — AZELASTINE HCL 0.1 % NA SOLN: 1.0000 | Freq: Two times a day (BID) | NASAL | Status: DC

## 2012-07-13 MED ORDER — FLUTICASONE PROPIONATE 50 MCG/ACT NA SUSP: 2.0000 | Freq: Every day | NASAL | Status: AC

## 2012-07-13 NOTE — Assessment & Plan Note (Signed)
Suspect eustachian tube dysfunction status post upper respiratory infection. Continue Mucinex and D. short-term. Refill Flonase and Astelin and continue for at least several weeks.

## 2012-07-13 NOTE — Progress Notes (Signed)
  Subjective:    Patient ID: Ann Vang, female    DOB: September 29, 1945, 66 y.o.   MRN: [id number]  HPI patient presents to clinic for your duration of bilateral ear pressure. Notes recent URI now resolving. Developed subsequent bilateral ear pressure without drainage. Has had history of recurrent right ear infection status post ENT evaluation and temporary tube placement. Also previously prescribed Flonase and Astelin. Had prescription at home and resumed the medication approximately 5 days ago. Also taking Mucinex and D. which appears to be helping the symptoms. History of lumbar compression fracture and osteopenia. Sees gynecology who has been following this and bone densities. Has received influenza vaccine for the season. No other complaints.  No past medical history on file. No past surgical history on file.  reports that she has quit smoking. She does not have any smokeless tobacco history on file. Her alcohol and drug histories not on file. family history is not on file. No Known Allergies   Review of Systems see hpi     Objective:   Physical Exam  Nursing note and vitals reviewed. Constitutional: She appears well-developed and well-nourished. No distress.  HENT:  Head: Normocephalic and atraumatic.  Right Ear: Tympanic membrane, external ear and ear canal normal.  Left Ear: Tympanic membrane, external ear and ear canal normal.  Nose: Nose normal.  Mouth/Throat: Oropharynx is clear and moist. No oropharyngeal exudate.  Eyes: Conjunctivae normal are normal. No scleral icterus.  Neurological: She is alert.  Skin: She is not diaphoretic.  Psychiatric: She has a normal mood and affect.          Assessment & Plan:

## 2012-08-26 DIAGNOSIS — L719 Rosacea, unspecified: Secondary | ICD-10-CM | POA: Diagnosis not present

## 2012-08-26 DIAGNOSIS — D239 Other benign neoplasm of skin, unspecified: Secondary | ICD-10-CM | POA: Diagnosis not present

## 2012-08-26 DIAGNOSIS — L821 Other seborrheic keratosis: Secondary | ICD-10-CM | POA: Diagnosis not present

## 2012-08-26 DIAGNOSIS — L219 Seborrheic dermatitis, unspecified: Secondary | ICD-10-CM | POA: Diagnosis not present

## 2012-08-26 DIAGNOSIS — I781 Nevus, non-neoplastic: Secondary | ICD-10-CM | POA: Diagnosis not present

## 2012-12-20 ENCOUNTER — Encounter: Payer: Self-pay | Admitting: Obstetrics and Gynecology

## 2012-12-21 ENCOUNTER — Ambulatory Visit (INDEPENDENT_AMBULATORY_CARE_PROVIDER_SITE_OTHER): Payer: Medicare Other | Admitting: Obstetrics and Gynecology

## 2012-12-21 ENCOUNTER — Encounter: Payer: Self-pay | Admitting: Obstetrics and Gynecology

## 2012-12-21 VITALS — BP 118/70 | Ht 67.0 in | Wt 153.0 lb

## 2012-12-21 DIAGNOSIS — M810 Age-related osteoporosis without current pathological fracture: Secondary | ICD-10-CM | POA: Diagnosis not present

## 2012-12-21 DIAGNOSIS — Z01419 Encounter for gynecological examination (general) (routine) without abnormal findings: Secondary | ICD-10-CM

## 2012-12-21 DIAGNOSIS — Z124 Encounter for screening for malignant neoplasm of cervix: Secondary | ICD-10-CM

## 2012-12-21 MED ORDER — PROGESTERONE MICRONIZED 100 MG PO CAPS: 100.0000 mg | ORAL_CAPSULE | Freq: Every day | ORAL | Status: DC

## 2012-12-21 MED ORDER — ESTRADIOL ACETATE 0.05 MG/24HR VA RING: 1.0000 | VAGINAL_RING | VAGINAL | Status: DC

## 2012-12-21 NOTE — Progress Notes (Signed)
67 y.o.  Married  Caucasian female   G0P0 here for annual exam.  Loves her HRT, wants to continue.  Exercises 5-6 days a week.    No LMP recorded. Patient is postmenopausal.          Sexually active: yes  The current method of family planning is post menopausal status.    Exercising: fitness center weights, cardio, pilates, barre 3 times a week Last mammogram: 05/10/12 benign  Last pap smear:11/30/09 neg History of abnormal pap: no Smoking: quit 35 years ago Alcohol: 2 glasses of wine a night Last colonoscopy: 2007 1 polyp repeat in 6years Last Bone Density:  04/14/12 mild osteopenia Last tetanus shot: 6 years ago Last cholesterol check: not sure  Hgb:     pcp           Urine: pcp    Health Maintenance  Topic Date Due  . Zostavax  05/26/2006  . Pneumococcal Polysaccharide Vaccine Age 10 And Over  05/27/2011  . Colonoscopy  12/17/2011  . Influenza Vaccine  04/18/2013  . Mammogram  05/10/2014  . Tetanus/tdap  03/16/2018    Family History  Problem Relation Age of Onset  . Osteoporosis Mother   . Alzheimer's disease Mother   . Hypertension Father   . Osteoporosis Maternal Grandmother     Patient Active Problem List   Diagnosis Date Noted  . Lumbar compression fracture 07/13/2012  . Ear pressure 07/13/2012  . DYSFUNCTION OF EUSTACHIAN TUBE 07/30/2010  . OSTEOPENIA 03/15/2010    Past Medical History  Diagnosis Date  . Menopause   . Osteoporosis     Fx L4 age 26 water skiing accident  . Abnormal breast finding 12/1998    left breast abnl  . Diverticulosis 2003    mild  . Lumbar disc disorder 2000    bulging    Past Surgical History  Procedure Laterality Date  . Cervical biopsy  w/ loop electrode excision  1996    [facility]  . Tonsillectomy and adenoidectomy  1964  . Augmentation mammaplasty  1983  . Breast surgery  2010    implants removed ruptured  . L-2 fracture  2009    fall in aerobics  . L-4 fracture   water skiing    . Knee surgery  2012    meniscus rt  knee (both)    Allergies: Macrobid  Current Outpatient Prescriptions  Medication Sig Dispense Refill  . azelastine (ASTELIN) 137 MCG/SPRAY nasal spray Place 1 spray into the nose 2 (two) times daily. Use in each nostril as directed  90 mL  1  . Estradiol Acetate (FEMRING) 0.05 MG/24HR RING Place vaginally every 3 (three) months.      . fluticasone (FLONASE) 50 MCG/ACT nasal spray Place 2 sprays into the nose daily.  48 g  1  . progesterone (PROMETRIUM) 100 MG capsule Take 100 mg by mouth daily.      . Pseudoephedrine-Guaifenesin (MUCINEX D) [phone] MG TB12 Take by mouth as needed.       No current facility-administered medications for this visit.    ROS: Pertinent items are noted in HPI.  Social Hx:  Married, no children   Exam:    BP 118/70  Ht 5\' 7"  (1.702 m)  Wt 153 lb (69.4 kg)  BMI 23.96 kg/m2  Down 2 pounds Wt Readings from Last 3 Encounters:  12/21/12 153 lb (69.4 kg)  07/13/12 158 lb (71.668 kg)  07/30/10 157 lb (71.215 kg)     Ht Readings from  Last 3 Encounters:  12/21/12 5\' 7"  (1.702 m)  07/13/12 5\' 7"  (1.702 m)  07/19/10 5' 7.5" (1.715 m)    General appearance: alert, cooperative and appears stated age Head: Normocephalic, without obvious abnormality, atraumatic Neck: no adenopathy, supple, symmetrical, trachea midline and thyroid not enlarged, symmetric, no tenderness/mass/nodules Lungs: clear to auscultation bilaterally Breasts: Inspection negative, No nipple retraction or dimpling, No nipple discharge or bleeding, No axillary or supraclavicular adenopathy, Normal to palpation without dominant masses Heart: regular rate and rhythm Abdomen: soft, non-tender; bowel sounds normal; no masses,  no organomegaly Extremities: extremities normal, atraumatic, no cyanosis or edema Skin: Skin color, texture, turgor normal. No rashes or lesions Lymph nodes: Cervical, supraclavicular, and axillary nodes normal. No abnormal inguinal nodes palpated Neurologic: Grossly  normal   Pelvic: External genitalia:  no lesions              Urethra:  normal appearing urethra with no masses, tenderness or lesions              Bartholins and Skenes: normal                 Vagina: normal appearing vagina with normal color and discharge, no lesions              Cervix: normal appearance              Pap taken: no        Bimanual Exam:  Uterus:  uterus is normal size, shape, consistency and nontender                                      Adnexa: normal adnexa in size, nontender and no masses                                      Rectovaginal: Confirms                                      Anus:  normal sphincter tone, no lesions  A: normal menopausal exam, on HRT     H/o osteoporosis - two fx's in back with min trauma.  Strong FH of osteoporosis.     Fosamax stopped in 2006 after 8 years of use     H/o PMB with neg w/u in 2012     P: mammogram counseled on breast self exam, mammography screening, adequate intake of calcium and vitamin D, diet and exercise return annually or prn     An After Visit Summary was printed and given to the patient.

## 2012-12-21 NOTE — Patient Instructions (Signed)

## 2013-04-04 DIAGNOSIS — D3130 Benign neoplasm of unspecified choroid: Secondary | ICD-10-CM | POA: Diagnosis not present

## 2013-04-04 DIAGNOSIS — H43819 Vitreous degeneration, unspecified eye: Secondary | ICD-10-CM | POA: Diagnosis not present

## 2013-04-26 ENCOUNTER — Other Ambulatory Visit: Payer: Self-pay

## 2013-04-26 DIAGNOSIS — Z1231 Encounter for screening mammogram for malignant neoplasm of breast: Secondary | ICD-10-CM

## 2013-05-16 ENCOUNTER — Ambulatory Visit
Admission: RE | Admit: 2013-05-16 | Discharge: 2013-05-16 | Disposition: A | Discharge status: Home / Self Care | Payer: Medicare Other | Source: Ambulatory Visit

## 2013-05-16 DIAGNOSIS — Z1231 Encounter for screening mammogram for malignant neoplasm of breast: Secondary | ICD-10-CM

## 2013-06-27 DIAGNOSIS — Z23 Encounter for immunization: Secondary | ICD-10-CM | POA: Diagnosis not present

## 2013-08-19 ENCOUNTER — Telehealth: Payer: Self-pay | Admitting: Physician Assistant

## 2013-08-19 NOTE — Telephone Encounter (Signed)
No fever, headache, cold, sore throat since Monday. Thinks she has the flu, does she need to be seen or can something be called in?

## 2013-08-19 NOTE — Telephone Encounter (Signed)
Pt informed, states she will ride it out and see how she feels.

## 2013-08-19 NOTE — Telephone Encounter (Signed)
She would need to be seen in clinic to assess for viral or bacterial respiratory infection.  At this point, even if she has the flu, prescription medication for influenza would be ineffective.  However, if she has  A bronchitis, sinus infection, etc, that could be treated after evaluation in clinic.

## 2013-11-03 ENCOUNTER — Encounter: Payer: Self-pay | Admitting: Internal Medicine

## 2013-11-07 ENCOUNTER — Encounter: Payer: Self-pay | Admitting: Internal Medicine

## 2013-12-23 ENCOUNTER — Other Ambulatory Visit: Payer: Self-pay | Admitting: *Deleted

## 2013-12-23 ENCOUNTER — Telehealth: Payer: Self-pay | Admitting: Obstetrics and Gynecology

## 2013-12-23 MED ORDER — ESTRADIOL ACETATE 0.05 MG/24HR VA RING: 1.0000 | VAGINAL_RING | VAGINAL | Status: DC

## 2013-12-23 MED ORDER — PROGESTERONE MICRONIZED 100 MG PO CAPS: 100.0000 mg | ORAL_CAPSULE | Freq: Every day | ORAL | Status: DC

## 2013-12-23 NOTE — Telephone Encounter (Signed)
Incoming fax from [facility] requesting refill on Micro progesterone and femring.   Last AEX and refill 12/21/12 Femring #1/3 refills. Progesterone #90/3 refills Last MMG 05/17/13 Next appt 12/30/13  Will refill once until appt.

## 2013-12-23 NOTE — Telephone Encounter (Signed)
Trying to confirm pts appt mail box full unable to leave message

## 2013-12-28 ENCOUNTER — Ambulatory Visit (AMBULATORY_SURGERY_CENTER): Payer: Self-pay | Admitting: *Deleted

## 2013-12-28 VITALS — Ht 67.0 in | Wt 159.4 lb

## 2013-12-28 DIAGNOSIS — Z86010 Personal history of colonic polyps: Secondary | ICD-10-CM

## 2013-12-28 MED ORDER — MOVIPREP 100 G PO SOLR: ORAL | Status: DC

## 2013-12-28 NOTE — Progress Notes (Signed)
Patient denies any allergies to eggs or soy. Patient denies any problems with anesthesia/sedation. Patient denies any oxygen use at home and does not take any diet/weight loss medications. EMMI education assisgned to patient on colonoscopy, this was explained and instructions given to patient. 

## 2013-12-30 ENCOUNTER — Ambulatory Visit: Payer: Medicare Other | Admitting: Obstetrics and Gynecology

## 2013-12-30 ENCOUNTER — Ambulatory Visit (INDEPENDENT_AMBULATORY_CARE_PROVIDER_SITE_OTHER): Payer: Medicare Other | Admitting: Obstetrics and Gynecology

## 2013-12-30 ENCOUNTER — Encounter: Payer: Self-pay | Admitting: Obstetrics and Gynecology

## 2013-12-30 VITALS — BP 130/74 | HR 76 | Resp 16 | Ht 67.25 in | Wt 156.0 lb

## 2013-12-30 DIAGNOSIS — Z01419 Encounter for gynecological examination (general) (routine) without abnormal findings: Secondary | ICD-10-CM | POA: Diagnosis not present

## 2013-12-30 DIAGNOSIS — Z0000 Encounter for general adult medical examination without abnormal findings: Secondary | ICD-10-CM | POA: Diagnosis not present

## 2013-12-30 DIAGNOSIS — M899 Disorder of bone, unspecified: Secondary | ICD-10-CM

## 2013-12-30 DIAGNOSIS — M8580 Other specified disorders of bone density and structure, unspecified site: Secondary | ICD-10-CM

## 2013-12-30 DIAGNOSIS — M949 Disorder of cartilage, unspecified: Secondary | ICD-10-CM | POA: Diagnosis not present

## 2013-12-30 DIAGNOSIS — Z124 Encounter for screening for malignant neoplasm of cervix: Secondary | ICD-10-CM

## 2013-12-30 LAB — HEMOGLOBIN, FINGERSTICK: HEMOGLOBIN, FINGERSTICK: 13.8 g/dL (ref 12.0–16.0)

## 2013-12-30 LAB — POCT URINALYSIS DIPSTICK
Blood, UA: NEGATIVE
Glucose, UA: NEGATIVE
Ketones, UA: NEGATIVE
LEUKOCYTES UA: NEGATIVE
NITRITE UA: NEGATIVE
PH UA: 5
PROTEIN UA: NEGATIVE
Urobilinogen, UA: NEGATIVE

## 2013-12-30 MED ORDER — PROGESTERONE MICRONIZED 100 MG PO CAPS: 100.0000 mg | ORAL_CAPSULE | Freq: Every day | ORAL | Status: DC

## 2013-12-30 MED ORDER — ESTRADIOL ACETATE 0.05 MG/24HR VA RING
1.0000 | VAGINAL_RING | VAGINAL | Status: DC
Start: 2013-12-30 — End: 2015-02-05

## 2013-12-30 NOTE — Patient Instructions (Signed)

## 2013-12-30 NOTE — Progress Notes (Signed)
Patient ID: Ann Vang, female   DOB: [birth year], 68 y.o.   MRN: [id number] GYNECOLOGY VISIT  PCP:  None per patient  Referring provider:   None  HPI: 68 y.o.   Married  Caucasian  female   G0P0 with No LMP recorded. Patient is postmenopausal.   here for annual exam.  Likes being on HRT.  Accepts the risks of DVT, PE, stroke, MI and breast cancer.  Has a history of osteoporosis and now osteopenia.  Wants to avoid fracture risk. Feels good on HRT.   Has  Multiple lipomas.   Hgb:  13.8 Urine:  Negative   GYNECOLOGIC HISTORY: No LMP recorded. Patient is postmenopausal. Sexually active:  yes Partner preference:  female Contraception:   postmenopausal Menopausal hormone therapy:  Femring, Prometrium  DES exposure:   none Blood transfusions:   none Sexually transmitted diseases:  none    GYN procedures and prior surgeries:  LEEP 1996 Last mammogram:  05/17/13, 3D, normal, TBC           Last pap and high risk HPV testing:   11/30/09, WNL History of abnormal pap smear:  yes   OB History   Grav Para Term Preterm Abortions TAB SAB Ect Mult Living   0                LIFESTYLE: Exercise:  Yes, Pilates 3 times per week, weights 3 times per week, cardio 2-3 times per week      Tobacco:  none Alcohol:  7-10 glasses of wine per week Drug use:  none  OTHER HEALTH MAINTENANCE: Tetanus/TDap:  2009 Gardisil: n/a Influenza:  2014 Zostavax:  2008 Pneumovax:  none  Bone density:  04/14/12, osteopenia, T score -1.3 left hip, [facility].  History of Fosamax use. On HRT now.  Colonoscopy:  12/11/06, hyperplastic polyp, has appt scheduled with Dr. Olevia Perches.  Due in 2 weeks.   Cholesterol check: ?  Family History  Problem Relation Age of Onset  . Osteoporosis Mother   . Alzheimer's disease Mother   . Hypertension Father   . Osteoporosis Maternal Grandmother   . Colon cancer Maternal Grandmother   . Colon cancer Other     Patient Active Problem List   Diagnosis Date Noted  . Lumbar  compression fracture 07/13/2012  . Ear pressure 07/13/2012  . DYSFUNCTION OF EUSTACHIAN TUBE 07/30/2010  . OSTEOPENIA 03/15/2010   Past Medical History  Diagnosis Date  . Menopause   . Osteoporosis     Fx L4 age 66 water skiing accident  . Abnormal breast finding 12/1998    left breast abnl  . Diverticulosis 2003    mild  . Lumbar disc disorder 2000    bulging    Past Surgical History  Procedure Laterality Date  . Cervical biopsy  w/ loop electrode excision  1996    [facility]  . Tonsillectomy and adenoidectomy  1964  . Augmentation mammaplasty  1983  . Breast surgery  2010    implants removed ruptured  . L-2 fracture  2009    fall in aerobics  . L-4 fracture   water skiing    . Knee surgery  2012    meniscus rt knee (both)    ALLERGIES: Macrobid  Current Outpatient Prescriptions  Medication Sig Dispense Refill  . Estradiol Acetate (FEMRING) 0.05 MG/24HR RING Place 1 each vaginally every 3 (three) months.  1 each  0  . fluticasone (FLONASE) 50 MCG/ACT nasal spray Place 2 sprays into the nose daily.  48 g  1  . ibuprofen (ADVIL,MOTRIN) 200 MG tablet Take 200 mg by mouth every 6 (six) hours as needed.      . progesterone (PROMETRIUM) 100 MG capsule Take 1 capsule (100 mg total) by mouth daily.  90 capsule  0  . MOVIPREP 100 G SOLR Take as directed  1 kit  0   No current facility-administered medications for this visit.     ROS:  Pertinent items are noted in HPI.  SOCIAL HISTORY:  Retired.   PHYSICAL EXAMINATION:    BP 130/74  Pulse 76  Resp 16  Ht 5' 7.25" (1.708 m)  Wt 156 lb (70.761 kg)  BMI 24.26 kg/m2   Wt Readings from Last 3 Encounters:  12/30/13 156 lb (70.761 kg)  12/28/13 159 lb 6.4 oz (72.303 kg)  12/21/12 153 lb (69.4 kg)     Ht Readings from Last 3 Encounters:  12/30/13 5' 7.25" (1.708 m)  12/28/13 5' 7" (1.702 m)  12/21/12 5' 7" (1.702 m)    General appearance: alert, cooperative and appears stated age Head: Normocephalic, without obvious  abnormality, atraumatic Neck: no adenopathy, supple, symmetrical, trachea midline and thyroid not enlarged, symmetric, no tenderness/mass/nodules Lungs: clear to auscultation bilaterally Breasts: Inspection negative, No nipple retraction or dimpling, No nipple discharge or bleeding, No axillary or supraclavicular adenopathy, Normal to palpation without dominant masses.  Left chest wall 2.5 cm fixed mass.  (MRI of breast done showing no definite lipoma but no abnormal breast tissue either.) Heart: regular rate and rhythm Abdomen: soft, non-tender; no masses,  no organomegaly Extremities: extremities normal, atraumatic, no cyanosis or edema Skin: Skin color, texture, turgor normal. No rashes or lesions.  Has subcutaneous lump of midline beck at base of neck. Lymph nodes: Cervical, supraclavicular, and axillary nodes normal. No abnormal inguinal nodes palpated Neurologic: Grossly normal  Pelvic: External genitalia:  no lesions              Urethra:  normal appearing urethra with no masses, tenderness or lesions              Bartholins and Skenes: normal                 Vagina: normal appearing vagina with normal color and discharge, no lesions              Cervix: normal appearance              Pap only: yes.            Bimanual Exam:  Uterus:  uterus is normal size, shape, consistency and nontender                                      Adnexa: normal adnexa in size, nontender and no masses                                      Rectovaginal: Confirms                                      Anus:  normal sphincter tone, no lesions  ASSESSMENT  Normal gynecologic exam. Lipomas.  Osteopenia.   PLAN  Mammogram recommended yearly.  Bone density in September 2015 at [facility].  Continue HRT- Prometrium and Femring.  See Epic orders.  Pap smear and high risk HPV testing Counseled on self breast exam, Calcium and vitamin D intake, exercise. Return annually or prn   An After Visit Summary was  printed and given to the patient.

## 2014-01-03 LAB — IPS PAP SMEAR ONLY

## 2014-01-11 ENCOUNTER — Ambulatory Visit (AMBULATORY_SURGERY_CENTER): Payer: Medicare Other | Admitting: Internal Medicine

## 2014-01-11 ENCOUNTER — Encounter: Payer: Self-pay | Admitting: Internal Medicine

## 2014-01-11 VITALS — BP 128/71 | HR 63 | Temp 96.8°F | Resp 14 | Ht 67.0 in | Wt 159.0 lb

## 2014-01-11 DIAGNOSIS — Z800 Family history of malignant neoplasm of digestive organs: Secondary | ICD-10-CM | POA: Diagnosis not present

## 2014-01-11 DIAGNOSIS — Z86010 Personal history of colonic polyps: Secondary | ICD-10-CM | POA: Diagnosis not present

## 2014-01-11 DIAGNOSIS — K5730 Diverticulosis of large intestine without perforation or abscess without bleeding: Secondary | ICD-10-CM | POA: Diagnosis not present

## 2014-01-11 DIAGNOSIS — Z1211 Encounter for screening for malignant neoplasm of colon: Secondary | ICD-10-CM | POA: Diagnosis not present

## 2014-01-11 MED ORDER — SODIUM CHLORIDE 0.9 % IV SOLN: 500.0000 mL | INTRAVENOUS | Status: DC

## 2014-01-11 NOTE — Progress Notes (Signed)
To PACU awake and alert, report to RN. 

## 2014-01-11 NOTE — Op Note (Signed)
[place]  [address]. [place], [id number]   COLONOSCOPY PROCEDURE REPORT  PATIENT: Ann Vang, Ann Vang  MR#: [id number] BIRTHDATE: Aug 21, 1945 , 83  yrs. old GENDER: Female ENDOSCOPIST: Lafayette Dragon, MD REFERRED BY:Dr Clearnce Sorrel PROCEDURE DATE:  01/11/2014 PROCEDURE:   Colonoscopy, screening First Screening Colonoscopy - Avg.  risk and is 50 yrs.  old or older - No.  Prior Negative Screening - Now for repeat screening. N/A  History of Adenoma - Now for follow-up colonoscopy & has been > or = to 3 yrs.  N/A  Polyps Removed Today? No.  Recommend repeat exam, <10 yrs? No. ASA CLASS:   Class II INDICATIONS:positive family history of colon cancer in maternal grandmother.  Prior colonoscopy May 2003 and in April 2008 last colonoscopy.  2 hyperplastic polyps removed. MEDICATIONS: MAC sedation, administered by CRNA and propofol (Diprivan) 350mg  IV  DESCRIPTION OF PROCEDURE:   After the risks benefits and alternatives of the procedure were thoroughly explained, informed consent was obtained.  A digital rectal exam revealed no abnormalities of the rectum.   The LB [id number]  endoscope was introduced through the anus and advanced to the cecum, which was identified by both the appendix and ileocecal valve. No adverse events experienced.   The quality of the prep was good, using MoviPrep  The instrument was then slowly withdrawn as the colon was fully examined.      COLON FINDINGS: Mild diverticulosis was noted throughout the entire examined colon.  Retroflexed views revealed no abnormalities. The time to cecum=10 minutes 08 seconds.  Withdrawal time=7 minutes 52 seconds.  The scope was withdrawn and the procedure completed. COMPLICATIONS: There were no complications.  ENDOSCOPIC IMPRESSION: Mild diverticulosis was noted throughout the entire examined colon  RECOMMENDATIONS: high fiber diet Recall colonoscopy in 10 years   eSigned:  Lafayette Dragon, MD 01/11/2014 8:30 AM   cc:   PATIENT NAME:  Ann Vang, Ann Vang MR#: [id number]

## 2014-01-11 NOTE — Progress Notes (Signed)
No complaints noted in the recovery room. Maw   

## 2014-01-11 NOTE — Patient Instructions (Signed)

## 2014-01-12 ENCOUNTER — Telehealth: Payer: Self-pay | Admitting: *Deleted

## 2014-01-12 DIAGNOSIS — L570 Actinic keratosis: Secondary | ICD-10-CM | POA: Diagnosis not present

## 2014-01-12 DIAGNOSIS — I781 Nevus, non-neoplastic: Secondary | ICD-10-CM | POA: Diagnosis not present

## 2014-01-12 DIAGNOSIS — L719 Rosacea, unspecified: Secondary | ICD-10-CM | POA: Diagnosis not present

## 2014-01-12 DIAGNOSIS — D239 Other benign neoplasm of skin, unspecified: Secondary | ICD-10-CM | POA: Diagnosis not present

## 2014-01-12 DIAGNOSIS — L821 Other seborrheic keratosis: Secondary | ICD-10-CM | POA: Diagnosis not present

## 2014-01-12 DIAGNOSIS — D485 Neoplasm of uncertain behavior of skin: Secondary | ICD-10-CM | POA: Diagnosis not present

## 2014-01-12 NOTE — Telephone Encounter (Signed)
  Follow up Call-  Call back number 01/11/2014  Post procedure Call Back phone  # [phone]  Permission to leave phone message Yes     Patient questions:  Do you have a fever, pain , or abdominal swelling? no Pain Score  0 *  Have you tolerated food without any problems? yes  Have you been able to return to your normal activities? yes  Do you have any questions about your discharge instructions: Diet   no Medications  no Follow up visit  no  Do you have questions or concerns about your Care? no  Actions: * If pain score is 4 or above: No action needed, pain <4.

## 2014-04-20 ENCOUNTER — Other Ambulatory Visit: Payer: Self-pay

## 2014-04-20 ENCOUNTER — Telehealth: Payer: Self-pay | Admitting: Obstetrics and Gynecology

## 2014-04-20 DIAGNOSIS — Z1231 Encounter for screening mammogram for malignant neoplasm of breast: Secondary | ICD-10-CM

## 2014-04-20 NOTE — Telephone Encounter (Signed)
BMD order to Dr.Silva's desk for review and signature.

## 2014-04-20 NOTE — Telephone Encounter (Signed)
I signed order and will place on your desk.   Thanks.

## 2014-04-20 NOTE — Telephone Encounter (Signed)
Patient request an order be sent to [facility] for her BMD. 04/26/2014.

## 2014-04-21 NOTE — Telephone Encounter (Signed)
Spoke with patient. Advised order sent to [facility] this morning and she is all set for her 9/9 appointment. Patient agreeable.  Routing to provider for final review. Patient agreeable to disposition. Will close encounter

## 2014-04-21 NOTE — Telephone Encounter (Signed)
Order for BMD faxed to [facility] with cover sheet.

## 2014-04-26 DIAGNOSIS — M899 Disorder of bone, unspecified: Secondary | ICD-10-CM | POA: Diagnosis not present

## 2014-04-26 DIAGNOSIS — M949 Disorder of cartilage, unspecified: Secondary | ICD-10-CM | POA: Diagnosis not present

## 2014-05-11 ENCOUNTER — Telehealth: Payer: Self-pay

## 2014-05-11 NOTE — Telephone Encounter (Signed)
Called patient per Dr. Quincy Simmonds and notified of BMD results which revealed osteopenia--bone density stable.  Per Dr. Quincy Simmonds, continue with Vitamin D/Calcium and weight bearing exercise.  Repeat study in 2 years.  Patients verbalizes understanding.  AEX scheduled 12/2014 with Dr. Quincy Simmonds.

## 2014-05-17 ENCOUNTER — Encounter (INDEPENDENT_AMBULATORY_CARE_PROVIDER_SITE_OTHER): Payer: Self-pay

## 2014-05-17 ENCOUNTER — Ambulatory Visit
Admission: RE | Admit: 2014-05-17 | Discharge: 2014-05-17 | Disposition: A | Discharge status: Home / Self Care | Payer: Medicare Other | Source: Ambulatory Visit

## 2014-05-17 DIAGNOSIS — Z1231 Encounter for screening mammogram for malignant neoplasm of breast: Secondary | ICD-10-CM

## 2014-05-30 DIAGNOSIS — D3131 Benign neoplasm of right choroid: Secondary | ICD-10-CM | POA: Diagnosis not present

## 2014-05-30 DIAGNOSIS — H5211 Myopia, right eye: Secondary | ICD-10-CM | POA: Diagnosis not present

## 2014-05-30 DIAGNOSIS — H35372 Puckering of macula, left eye: Secondary | ICD-10-CM | POA: Diagnosis not present

## 2014-06-05 DIAGNOSIS — Z23 Encounter for immunization: Secondary | ICD-10-CM | POA: Diagnosis not present

## 2014-06-13 ENCOUNTER — Telehealth: Payer: Self-pay | Admitting: Obstetrics and Gynecology

## 2014-06-13 NOTE — Telephone Encounter (Signed)
.  appointment rescheduled

## 2014-06-13 NOTE — Telephone Encounter (Signed)
Dr cx appt lmtcb to rs °

## 2014-08-31 DIAGNOSIS — R220 Localized swelling, mass and lump, head: Secondary | ICD-10-CM | POA: Diagnosis not present

## 2014-08-31 DIAGNOSIS — M5090 Cervical disc disorder, unspecified, unspecified cervical region: Secondary | ICD-10-CM | POA: Diagnosis not present

## 2014-08-31 DIAGNOSIS — R221 Localized swelling, mass and lump, neck: Secondary | ICD-10-CM | POA: Diagnosis not present

## 2014-09-01 ENCOUNTER — Other Ambulatory Visit: Payer: Self-pay | Admitting: Orthopedic Surgery

## 2014-09-04 ENCOUNTER — Other Ambulatory Visit: Payer: Self-pay | Admitting: Orthopedic Surgery

## 2014-09-04 DIAGNOSIS — R221 Localized swelling, mass and lump, neck: Secondary | ICD-10-CM

## 2014-09-04 DIAGNOSIS — R220 Localized swelling, mass and lump, head: Secondary | ICD-10-CM

## 2014-09-18 ENCOUNTER — Ambulatory Visit
Admission: RE | Admit: 2014-09-18 | Discharge: 2014-09-18 | Disposition: A | Discharge status: Home / Self Care | Payer: Medicare Other | Source: Ambulatory Visit | Attending: Orthopedic Surgery | Admitting: Orthopedic Surgery

## 2014-09-18 DIAGNOSIS — M4854XA Collapsed vertebra, not elsewhere classified, thoracic region, initial encounter for fracture: Secondary | ICD-10-CM | POA: Diagnosis not present

## 2014-09-18 DIAGNOSIS — R221 Localized swelling, mass and lump, neck: Secondary | ICD-10-CM

## 2014-09-18 DIAGNOSIS — R220 Localized swelling, mass and lump, head: Secondary | ICD-10-CM

## 2014-09-18 MED ORDER — GADOBENATE DIMEGLUMINE 529 MG/ML IV SOLN
14.0000 mL | Freq: Once | INTRAVENOUS | Status: AC | PRN
  Administered 2014-09-18: 14 mL via INTRAVENOUS

## 2014-09-25 DIAGNOSIS — R221 Localized swelling, mass and lump, neck: Secondary | ICD-10-CM | POA: Diagnosis not present

## 2014-09-25 DIAGNOSIS — R200 Anesthesia of skin: Secondary | ICD-10-CM | POA: Diagnosis not present

## 2014-09-26 ENCOUNTER — Other Ambulatory Visit: Payer: Self-pay | Admitting: Orthopedic Surgery

## 2014-09-26 DIAGNOSIS — R221 Localized swelling, mass and lump, neck: Principal | ICD-10-CM

## 2014-09-26 DIAGNOSIS — R220 Localized swelling, mass and lump, head: Secondary | ICD-10-CM

## 2014-09-28 ENCOUNTER — Ambulatory Visit
Admission: RE | Admit: 2014-09-28 | Discharge: 2014-09-28 | Disposition: A | Discharge status: Home / Self Care | Payer: Medicare Other | Source: Ambulatory Visit | Attending: Orthopedic Surgery | Admitting: Orthopedic Surgery

## 2014-09-28 DIAGNOSIS — R221 Localized swelling, mass and lump, neck: Principal | ICD-10-CM

## 2014-09-28 DIAGNOSIS — R220 Localized swelling, mass and lump, head: Secondary | ICD-10-CM

## 2014-09-28 DIAGNOSIS — E042 Nontoxic multinodular goiter: Secondary | ICD-10-CM | POA: Diagnosis not present

## 2014-10-06 DIAGNOSIS — D171 Benign lipomatous neoplasm of skin and subcutaneous tissue of trunk: Secondary | ICD-10-CM | POA: Diagnosis not present

## 2014-10-07 DIAGNOSIS — M1851 Other unilateral secondary osteoarthritis of first carpometacarpal joint, right hand: Secondary | ICD-10-CM | POA: Diagnosis not present

## 2014-10-07 DIAGNOSIS — M79644 Pain in right finger(s): Secondary | ICD-10-CM | POA: Diagnosis not present

## 2014-10-09 DIAGNOSIS — R220 Localized swelling, mass and lump, head: Secondary | ICD-10-CM | POA: Diagnosis not present

## 2014-10-17 DIAGNOSIS — E079 Disorder of thyroid, unspecified: Secondary | ICD-10-CM

## 2014-10-17 HISTORY — PX: OTHER SURGICAL HISTORY: SHX169

## 2014-10-17 HISTORY — DX: Disorder of thyroid, unspecified: E07.9

## 2014-10-18 DIAGNOSIS — E041 Nontoxic single thyroid nodule: Secondary | ICD-10-CM | POA: Diagnosis not present

## 2014-11-24 DIAGNOSIS — G5601 Carpal tunnel syndrome, right upper limb: Secondary | ICD-10-CM | POA: Diagnosis not present

## 2014-11-24 DIAGNOSIS — M1811 Unilateral primary osteoarthritis of first carpometacarpal joint, right hand: Secondary | ICD-10-CM | POA: Diagnosis not present

## 2014-12-21 DIAGNOSIS — Z23 Encounter for immunization: Secondary | ICD-10-CM | POA: Diagnosis not present

## 2014-12-21 DIAGNOSIS — Z6824 Body mass index (BMI) 24.0-24.9, adult: Secondary | ICD-10-CM | POA: Diagnosis not present

## 2014-12-21 DIAGNOSIS — Z1389 Encounter for screening for other disorder: Secondary | ICD-10-CM | POA: Diagnosis not present

## 2014-12-21 DIAGNOSIS — E041 Nontoxic single thyroid nodule: Secondary | ICD-10-CM | POA: Diagnosis not present

## 2015-01-03 ENCOUNTER — Telehealth: Payer: Self-pay | Admitting: Obstetrics and Gynecology

## 2015-01-03 NOTE — Telephone Encounter (Signed)
Left message on voicemail to call and reschedule cancelled appointment. °

## 2015-01-08 ENCOUNTER — Ambulatory Visit: Payer: Medicare Other | Admitting: Obstetrics and Gynecology

## 2015-02-05 ENCOUNTER — Ambulatory Visit (INDEPENDENT_AMBULATORY_CARE_PROVIDER_SITE_OTHER): Payer: Medicare Other | Admitting: Obstetrics and Gynecology

## 2015-02-05 ENCOUNTER — Encounter: Payer: Self-pay | Admitting: Obstetrics and Gynecology

## 2015-02-05 VITALS — BP 134/70 | HR 76 | Resp 14 | Ht 67.0 in | Wt 158.6 lb

## 2015-02-05 DIAGNOSIS — Z124 Encounter for screening for malignant neoplasm of cervix: Secondary | ICD-10-CM

## 2015-02-05 DIAGNOSIS — Z01419 Encounter for gynecological examination (general) (routine) without abnormal findings: Secondary | ICD-10-CM

## 2015-02-05 MED ORDER — ESTRADIOL ACETATE 0.05 MG/24HR VA RING: 1.0000 | VAGINAL_RING | VAGINAL | Status: DC

## 2015-02-05 MED ORDER — PROGESTERONE MICRONIZED 100 MG PO CAPS: 100.0000 mg | ORAL_CAPSULE | Freq: Every day | ORAL | Status: DC

## 2015-02-05 NOTE — Patient Instructions (Signed)

## 2015-02-05 NOTE — Progress Notes (Signed)
Patient ID: Ann Vang, female   DOB: Nov 28, 1945, 69 y.o.   MRN: [id number] 69 y.o. G0P0 Married Caucasian female here for annual exam.    On Femring and Prometrium.  Wants to continue.   Has multiple lipomas.  Had an MRI and diagnosed with thyroid nodules.  Had biopsy of nodule, and it was benign.  May also have right hand surgery.   Really enjoys golf.  Plays at [facility] and associated clubs.   PCP:  Velna Hatchet, MD   Patient's last menstrual period was 08/18/1994 (approximate).          Sexually active: Yes.   female The current method of family planning is post menopausal status.    Exercising: Yes.    pilates, cardio and weights. Smoker:  no  Health Maintenance: Pap:  12-30-13 normal:no HPV testing done History of abnormal Pap:  Yes, Hx of LEEP procedure 1996.  Follow up paps were normal.  LEEP done for abnormal bleeding. MMG:  05-17-14 Density Cat:C/Neg:[facility].  Normal left breast MRI done due to left chest wall mass. Colonoscopy:  01-11-14 mild diverticulosis with Dr. Delfin Edis.  Next due 12/2023. BMD:   04-26-14  Result  Osteopenia/stable:[facility] TDaP:  2009 Screening Labs:  Hb today: PCP, Urine today: PCP   reports that she quit smoking about 42 years ago. She has never used smokeless tobacco. She reports that she drinks about 8.4 oz of alcohol per week. She reports that she does not use illicit drugs.  Past Medical History  Diagnosis Date  . Menopause   . Osteoporosis     Fx L4 age 15 water skiing accident  . Abnormal breast finding 12/1998    left breast abnl  . Diverticulosis 2003    mild  . Lumbar disc disorder 2000    bulging  . Thyroid disease 10/2014    thyroid nodules--bx'd large nodule and was benign--sees Gen. surgeon with [facility]  . Carpal tunnel syndrome, right   . Arthritis     --basal joint--right hand    Past Surgical History  Procedure Laterality Date  . Cervical biopsy  w/ loop electrode excision  1996    [facility]  .  Tonsillectomy and adenoidectomy  1964  . Augmentation mammaplasty  1983  . Breast surgery  2010    implants removed ruptured  . L-2 fracture  2009    fall in aerobics  . L-4 fracture   water skiing    . Knee surgery  2012    meniscus rt knee (both)  . Thyroid nodule biopsy  10/2014    --[facility], [place]--    Current Outpatient Prescriptions  Medication Sig Dispense Refill  . Cholecalciferol (VITAMIN D) 2000 UNITS tablet Take 2,000 Units by mouth daily.    . Estradiol Acetate (FEMRING) 0.05 MG/24HR RING Place 1 each vaginally every 3 (three) months. 1 each 3  . fluticasone (FLONASE) 50 MCG/ACT nasal spray Place 2 sprays into the nose daily. 48 g 1  . ibuprofen (ADVIL,MOTRIN) 200 MG tablet Take 200 mg by mouth every 6 (six) hours as needed.    . progesterone (PROMETRIUM) 100 MG capsule Take 1 capsule (100 mg total) by mouth daily. 90 capsule 3   No current facility-administered medications for this visit.    Family History  Problem Relation Age of Onset  . Osteoporosis Mother   . Alzheimer's disease Mother   . Hypertension Father   . Osteoporosis Maternal Grandmother   . Colon cancer Maternal Grandmother   .  Colon cancer Other     ROS:  Pertinent items are noted in HPI.  Otherwise, a comprehensive ROS was negative.  Exam:   BP 134/70 mmHg  Pulse 76  Resp 14  Ht 5\' 7"  (1.702 m)  Wt 158 lb 9.6 oz (71.94 kg)  BMI 24.83 kg/m2  LMP 08/18/1994 (Approximate)    General appearance: alert, cooperative and appears stated age Head: Normocephalic, without obvious abnormality, atraumatic Neck: no adenopathy, supple, symmetrical, trachea midline and thyroid normal to inspection and palpation Lungs: clear to auscultation bilaterally Breasts: No nipple retraction or dimpling, No nipple discharge or bleeding, No axillary or supraclavicular adenopathy, Right breast no masses.  Left breast/chest wall laterally with 3 cm mass fixed to ribs, nontender. Heart: regular rate and  rhythm Abdomen: soft, non-tender; bowel sounds normal; no masses,  no organomegaly Extremities: extremities normal, atraumatic, no cyanosis or edema Skin: Skin color, texture, turgor normal. No rashes or lesions Lymph nodes: Cervical, supraclavicular, and axillary nodes normal. No abnormal inguinal nodes palpated Neurologic: Grossly normal  Pelvic: External genitalia:  no lesions              Urethra:  normal appearing urethra with no masses, tenderness or lesions              Bartholins and Skenes: normal                 Vagina: normal appearing vagina with normal color and discharge, no lesions              Cervix: no lesions              Pap taken:  Yes. Bimanual Exam:  Uterus:  normal size, contour, position, consistency, mobility, non-tender.. Femring in place.              Adnexa: normal adnexa and no mass, fullness, tenderness              Rectovaginal: Yes.  .  Confirms.              Anus:  normal sphincter tone, no lesions  Chaperone was present for exam.  Assessment:   Well woman visit with normal exam. Hx LEEP in 2006.  Osteopenia - mild, stable. HRT patient. Left chest wall lipoma.  Status post normal MRI of breast in 2012.  Plan: Yearly mammogram recommended after age 46.  Recommended self breast exam.  Pap and HR HPV as above. Discussed Calcium, Vitamin D, regular exercise program including cardiovascular and weight bearing exercise. Labs performed.  No..   See orders. Refills given on medications.  Yes.  .  See orders.  Femring and Prometrium.  Discussed risks of DVT, PE, MI, stroke, breast cancer. Bone density in 2017 or 2018. Follow up annually and prn.    After visit summary provided.

## 2015-02-07 ENCOUNTER — Encounter: Payer: Self-pay | Admitting: Obstetrics and Gynecology

## 2015-02-07 LAB — IPS PAP SMEAR ONLY

## 2015-02-12 ENCOUNTER — Other Ambulatory Visit: Payer: Self-pay

## 2015-02-14 ENCOUNTER — Telehealth: Payer: Self-pay | Admitting: Obstetrics and Gynecology

## 2015-02-14 DIAGNOSIS — G5601 Carpal tunnel syndrome, right upper limb: Secondary | ICD-10-CM | POA: Diagnosis not present

## 2015-02-14 DIAGNOSIS — M1811 Unilateral primary osteoarthritis of first carpometacarpal joint, right hand: Secondary | ICD-10-CM | POA: Diagnosis not present

## 2015-02-14 MED ORDER — ESTRADIOL ACETATE 0.05 MG/24HR VA RING: 1.0000 | VAGINAL_RING | VAGINAL | Status: DC

## 2015-02-14 MED ORDER — PROGESTERONE MICRONIZED 100 MG PO CAPS: 100.0000 mg | ORAL_CAPSULE | Freq: Every day | ORAL | Status: DC

## 2015-02-14 NOTE — Telephone Encounter (Signed)
Due to new insurance requirements the patient called to say she needs her Rx's for Femring and Progesterone sent to [facility] in 3 month increments.   [facility] ID #: [id number] [phone]

## 2015-02-14 NOTE — Telephone Encounter (Signed)
02/05/15 Femring #1/3 rfs & Progesterone 100 mg #90/3 rfs was sent to local [facility].  Femring #1/3 & Progesterone 100 mg #90/3 rfs sent to [facility] order, patient is aware.  Routed to provider for review, encounter closed.

## 2015-03-05 DIAGNOSIS — G5601 Carpal tunnel syndrome, right upper limb: Secondary | ICD-10-CM | POA: Diagnosis not present

## 2015-03-26 ENCOUNTER — Encounter: Payer: Medicare Other | Admitting: Family Medicine

## 2015-04-02 DIAGNOSIS — M4807 Spinal stenosis, lumbosacral region: Secondary | ICD-10-CM | POA: Diagnosis not present

## 2015-04-17 DIAGNOSIS — G5601 Carpal tunnel syndrome, right upper limb: Secondary | ICD-10-CM | POA: Diagnosis not present

## 2015-04-18 DIAGNOSIS — S92322A Displaced fracture of second metatarsal bone, left foot, initial encounter for closed fracture: Secondary | ICD-10-CM | POA: Diagnosis not present

## 2015-04-19 ENCOUNTER — Other Ambulatory Visit: Payer: Self-pay

## 2015-04-19 DIAGNOSIS — Z1231 Encounter for screening mammogram for malignant neoplasm of breast: Secondary | ICD-10-CM

## 2015-04-25 DIAGNOSIS — R8290 Unspecified abnormal findings in urine: Secondary | ICD-10-CM | POA: Diagnosis not present

## 2015-04-25 DIAGNOSIS — Z4789 Encounter for other orthopedic aftercare: Secondary | ICD-10-CM | POA: Diagnosis not present

## 2015-04-25 DIAGNOSIS — S92322D Displaced fracture of second metatarsal bone, left foot, subsequent encounter for fracture with routine healing: Secondary | ICD-10-CM | POA: Diagnosis not present

## 2015-04-25 DIAGNOSIS — N390 Urinary tract infection, site not specified: Secondary | ICD-10-CM | POA: Diagnosis not present

## 2015-04-26 DIAGNOSIS — E041 Nontoxic single thyroid nodule: Secondary | ICD-10-CM | POA: Diagnosis not present

## 2015-05-01 DIAGNOSIS — S6411XD Injury of median nerve at wrist and hand level of right arm, subsequent encounter: Secondary | ICD-10-CM | POA: Diagnosis not present

## 2015-05-02 DIAGNOSIS — Z0000 Encounter for general adult medical examination without abnormal findings: Secondary | ICD-10-CM | POA: Diagnosis not present

## 2015-05-02 DIAGNOSIS — E041 Nontoxic single thyroid nodule: Secondary | ICD-10-CM | POA: Diagnosis not present

## 2015-05-02 DIAGNOSIS — Z9889 Other specified postprocedural states: Secondary | ICD-10-CM | POA: Diagnosis not present

## 2015-05-02 DIAGNOSIS — G5601 Carpal tunnel syndrome, right upper limb: Secondary | ICD-10-CM | POA: Diagnosis not present

## 2015-05-02 DIAGNOSIS — M810 Age-related osteoporosis without current pathological fracture: Secondary | ICD-10-CM | POA: Diagnosis not present

## 2015-05-02 DIAGNOSIS — Z23 Encounter for immunization: Secondary | ICD-10-CM | POA: Diagnosis not present

## 2015-05-02 DIAGNOSIS — Z6825 Body mass index (BMI) 25.0-25.9, adult: Secondary | ICD-10-CM | POA: Diagnosis not present

## 2015-05-07 DIAGNOSIS — S6411XD Injury of median nerve at wrist and hand level of right arm, subsequent encounter: Secondary | ICD-10-CM | POA: Diagnosis not present

## 2015-05-14 DIAGNOSIS — S92322D Displaced fracture of second metatarsal bone, left foot, subsequent encounter for fracture with routine healing: Secondary | ICD-10-CM | POA: Diagnosis not present

## 2015-05-14 DIAGNOSIS — M79644 Pain in right finger(s): Secondary | ICD-10-CM | POA: Diagnosis not present

## 2015-05-14 DIAGNOSIS — G5601 Carpal tunnel syndrome, right upper limb: Secondary | ICD-10-CM | POA: Diagnosis not present

## 2015-05-14 DIAGNOSIS — Z4789 Encounter for other orthopedic aftercare: Secondary | ICD-10-CM | POA: Diagnosis not present

## 2015-05-15 DIAGNOSIS — E041 Nontoxic single thyroid nodule: Secondary | ICD-10-CM | POA: Insufficient documentation

## 2015-05-18 DIAGNOSIS — E041 Nontoxic single thyroid nodule: Secondary | ICD-10-CM | POA: Diagnosis not present

## 2015-05-21 ENCOUNTER — Other Ambulatory Visit: Payer: Self-pay | Admitting: Surgical Oncology

## 2015-05-21 DIAGNOSIS — E041 Nontoxic single thyroid nodule: Secondary | ICD-10-CM

## 2015-05-22 ENCOUNTER — Ambulatory Visit
Admission: RE | Admit: 2015-05-22 | Discharge: 2015-05-22 | Disposition: A | Discharge status: Home / Self Care | Payer: Medicare Other | Source: Ambulatory Visit

## 2015-05-22 DIAGNOSIS — Z1231 Encounter for screening mammogram for malignant neoplasm of breast: Secondary | ICD-10-CM | POA: Diagnosis not present

## 2015-05-23 ENCOUNTER — Other Ambulatory Visit: Payer: Self-pay | Admitting: Obstetrics and Gynecology

## 2015-05-23 DIAGNOSIS — R928 Other abnormal and inconclusive findings on diagnostic imaging of breast: Secondary | ICD-10-CM

## 2015-05-25 ENCOUNTER — Ambulatory Visit
Admission: RE | Admit: 2015-05-25 | Discharge: 2015-05-25 | Disposition: A | Discharge status: Home / Self Care | Payer: Medicare Other | Source: Ambulatory Visit | Attending: Surgical Oncology | Admitting: Surgical Oncology

## 2015-05-25 DIAGNOSIS — E041 Nontoxic single thyroid nodule: Secondary | ICD-10-CM

## 2015-05-25 DIAGNOSIS — E042 Nontoxic multinodular goiter: Secondary | ICD-10-CM | POA: Diagnosis not present

## 2015-05-29 ENCOUNTER — Ambulatory Visit
Admission: RE | Admit: 2015-05-29 | Discharge: 2015-05-29 | Disposition: A | Discharge status: Home / Self Care | Payer: Medicare Other | Source: Ambulatory Visit | Attending: Obstetrics and Gynecology | Admitting: Obstetrics and Gynecology

## 2015-05-29 ENCOUNTER — Other Ambulatory Visit: Payer: Self-pay | Admitting: Obstetrics and Gynecology

## 2015-05-29 DIAGNOSIS — R928 Other abnormal and inconclusive findings on diagnostic imaging of breast: Secondary | ICD-10-CM

## 2015-05-29 DIAGNOSIS — N6001 Solitary cyst of right breast: Secondary | ICD-10-CM | POA: Diagnosis not present

## 2015-05-31 ENCOUNTER — Ambulatory Visit
Admission: RE | Admit: 2015-05-31 | Discharge: 2015-05-31 | Disposition: A | Discharge status: Home / Self Care | Payer: Medicare Other | Source: Ambulatory Visit | Attending: Obstetrics and Gynecology | Admitting: Obstetrics and Gynecology

## 2015-05-31 ENCOUNTER — Other Ambulatory Visit: Payer: Self-pay | Admitting: Obstetrics and Gynecology

## 2015-05-31 DIAGNOSIS — N6001 Solitary cyst of right breast: Secondary | ICD-10-CM

## 2015-05-31 DIAGNOSIS — R928 Other abnormal and inconclusive findings on diagnostic imaging of breast: Secondary | ICD-10-CM

## 2015-06-05 DIAGNOSIS — Z1212 Encounter for screening for malignant neoplasm of rectum: Secondary | ICD-10-CM | POA: Diagnosis not present

## 2015-06-07 DIAGNOSIS — D225 Melanocytic nevi of trunk: Secondary | ICD-10-CM | POA: Diagnosis not present

## 2015-06-07 DIAGNOSIS — L821 Other seborrheic keratosis: Secondary | ICD-10-CM | POA: Diagnosis not present

## 2015-06-07 DIAGNOSIS — Z86018 Personal history of other benign neoplasm: Secondary | ICD-10-CM | POA: Diagnosis not present

## 2015-06-07 DIAGNOSIS — L719 Rosacea, unspecified: Secondary | ICD-10-CM | POA: Diagnosis not present

## 2015-06-07 DIAGNOSIS — D485 Neoplasm of uncertain behavior of skin: Secondary | ICD-10-CM | POA: Diagnosis not present

## 2015-06-11 DIAGNOSIS — S92322D Displaced fracture of second metatarsal bone, left foot, subsequent encounter for fracture with routine healing: Secondary | ICD-10-CM | POA: Diagnosis not present

## 2015-07-09 DIAGNOSIS — D485 Neoplasm of uncertain behavior of skin: Secondary | ICD-10-CM | POA: Diagnosis not present

## 2015-07-27 DIAGNOSIS — D3131 Benign neoplasm of right choroid: Secondary | ICD-10-CM | POA: Diagnosis not present

## 2015-07-27 DIAGNOSIS — H5213 Myopia, bilateral: Secondary | ICD-10-CM | POA: Diagnosis not present

## 2015-10-18 DIAGNOSIS — S92322D Displaced fracture of second metatarsal bone, left foot, subsequent encounter for fracture with routine healing: Secondary | ICD-10-CM | POA: Diagnosis not present

## 2015-11-09 DIAGNOSIS — S92322D Displaced fracture of second metatarsal bone, left foot, subsequent encounter for fracture with routine healing: Secondary | ICD-10-CM | POA: Diagnosis not present

## 2015-11-09 DIAGNOSIS — S92335A Nondisplaced fracture of third metatarsal bone, left foot, initial encounter for closed fracture: Secondary | ICD-10-CM | POA: Diagnosis not present

## 2015-11-28 DIAGNOSIS — L821 Other seborrheic keratosis: Secondary | ICD-10-CM | POA: Diagnosis not present

## 2015-11-28 DIAGNOSIS — D485 Neoplasm of uncertain behavior of skin: Secondary | ICD-10-CM | POA: Diagnosis not present

## 2015-12-03 DIAGNOSIS — J209 Acute bronchitis, unspecified: Secondary | ICD-10-CM | POA: Diagnosis not present

## 2015-12-07 DIAGNOSIS — S92322D Displaced fracture of second metatarsal bone, left foot, subsequent encounter for fracture with routine healing: Secondary | ICD-10-CM | POA: Diagnosis not present

## 2016-01-17 DIAGNOSIS — M659 Synovitis and tenosynovitis, unspecified: Secondary | ICD-10-CM | POA: Diagnosis not present

## 2016-01-17 DIAGNOSIS — M79644 Pain in right finger(s): Secondary | ICD-10-CM | POA: Diagnosis not present

## 2016-01-24 DIAGNOSIS — M79644 Pain in right finger(s): Secondary | ICD-10-CM | POA: Diagnosis not present

## 2016-01-24 DIAGNOSIS — M65341 Trigger finger, right ring finger: Secondary | ICD-10-CM | POA: Diagnosis not present

## 2016-01-24 DIAGNOSIS — M65841 Other synovitis and tenosynovitis, right hand: Secondary | ICD-10-CM | POA: Diagnosis not present

## 2016-01-24 DIAGNOSIS — Z4789 Encounter for other orthopedic aftercare: Secondary | ICD-10-CM | POA: Diagnosis not present

## 2016-02-07 DIAGNOSIS — M65341 Trigger finger, right ring finger: Secondary | ICD-10-CM | POA: Diagnosis not present

## 2016-02-13 ENCOUNTER — Ambulatory Visit (INDEPENDENT_AMBULATORY_CARE_PROVIDER_SITE_OTHER): Payer: Medicare Other | Admitting: Obstetrics and Gynecology

## 2016-02-13 ENCOUNTER — Encounter: Payer: Self-pay | Admitting: Obstetrics and Gynecology

## 2016-02-13 VITALS — BP 124/80 | HR 66 | Resp 12 | Ht 66.5 in | Wt 161.0 lb

## 2016-02-13 DIAGNOSIS — Z01419 Encounter for gynecological examination (general) (routine) without abnormal findings: Secondary | ICD-10-CM

## 2016-02-13 DIAGNOSIS — Z124 Encounter for screening for malignant neoplasm of cervix: Secondary | ICD-10-CM

## 2016-02-13 DIAGNOSIS — N941 Unspecified dyspareunia: Secondary | ICD-10-CM

## 2016-02-13 DIAGNOSIS — N72 Inflammatory disease of cervix uteri: Secondary | ICD-10-CM | POA: Diagnosis not present

## 2016-02-13 DIAGNOSIS — N841 Polyp of cervix uteri: Secondary | ICD-10-CM

## 2016-02-13 DIAGNOSIS — Z79890 Hormone replacement therapy (postmenopausal): Secondary | ICD-10-CM

## 2016-02-13 MED ORDER — ESTRADIOL ACETATE 0.05 MG/24HR VA RING: 1.0000 | VAGINAL_RING | VAGINAL | Status: DC

## 2016-02-13 MED ORDER — PROGESTERONE MICRONIZED 100 MG PO CAPS: 100.0000 mg | ORAL_CAPSULE | Freq: Every day | ORAL | Status: DC

## 2016-02-13 NOTE — Progress Notes (Signed)
70 y.o. G0P0 Married Caucasian female here for annual exam.    Using Femring and Prometrium. Wishes to continue.   Having pain with intercourse.  Leaves Femring in for intercourse.  Some loss of urine w/ cough/sneeze. Minor problem.  Broke her foot this year.  Started as a stress fx.  Hx osteoporosis.  Used Fosamax in the past.   Taking a diuretic for swelling in her hand/finger.  PCP:   Velna Hatchet, MD  Patient's last menstrual period was 08/18/1994 (approximate).           Sexually active: Yes.    The current method of family planning is post menopausal status.    Exercising: Yes.    Weight Lifting, Cardio, Pilates Smoker:  no  Health Maintenance: Pap:  02/05/15 WNL History of abnormal Pap:  Yes.  Hx of LEEP in 1996. MMG:  05/22/15 BIRADS0; 05/29/15 Dx & U/S BIRADS4; 05/31/15 U/S Guided R Breast Cyst Aspiration--Resume screening mammograms in 1 yr Colonoscopy:  01/11/14 Mild Diverticulitis-repeat 10 yrs BMD: 04/26/14  Result  Osteopenia.  T score -1.3. TDaP: 03/16/2008 HIV: Unsure Hep C: Unsure Screening Labs: PCP Hb today: PCP, Urine today: PCP   reports that she quit smoking about 43 years ago. She has never used smokeless tobacco. She reports that she drinks about 8.4 oz of alcohol per week. She reports that she does not use illicit drugs.  Past Medical History  Diagnosis Date  . Menopause   . Osteoporosis     Fx L4 age 77 water skiing accident  . Abnormal breast finding 12/1998    left breast abnl  . Diverticulosis 2003    mild  . Lumbar disc disorder 2000    bulging  . Thyroid disease 10/2014    thyroid nodules--bx'd large nodule and was benign--sees Gen. surgeon with [facility]  . Carpal tunnel syndrome, right   . Arthritis     --basal joint--right hand    Past Surgical History  Procedure Laterality Date  . Cervical biopsy  w/ loop electrode excision  1996    [facility]  . Tonsillectomy and adenoidectomy  1964  . Augmentation mammaplasty  1983  . Breast  surgery  2010    implants removed ruptured  . L-2 fracture  2009    fall in aerobics  . L-4 fracture   water skiing    . Knee surgery  2012    meniscus rt knee (both)  . Thyroid nodule biopsy  10/2014    --[facility], [place]--    Current Outpatient Prescriptions  Medication Sig Dispense Refill  . Cholecalciferol (VITAMIN D) 2000 UNITS tablet Take 2,000 Units by mouth daily.    . Estradiol Acetate (FEMRING) 0.05 MG/24HR RING Place 1 each vaginally every 3 (three) months. 1 each 3  . fluticasone (FLONASE) 50 MCG/ACT nasal spray Place 2 sprays into the nose daily. 48 g 1  . Hyaluronic Acid-Vitamin C (HYALURONIC ACID PO) Take 1 tablet by mouth daily.    Marland Kitchen ibuprofen (ADVIL,MOTRIN) 200 MG tablet Take 200 mg by mouth every 6 (six) hours as needed.    . progesterone (PROMETRIUM) 100 MG capsule Take 1 capsule (100 mg total) by mouth daily. 90 capsule 3   No current facility-administered medications for this visit.    Family History  Problem Relation Age of Onset  . Osteoporosis Mother   . Alzheimer's disease Mother   . Hypertension Father   . Osteoporosis Maternal Grandmother   . Colon cancer Maternal Grandmother   . Colon  cancer Other     ROS:  Pertinent items are noted in HPI.  Otherwise, a comprehensive ROS was negative.  Exam:   BP 124/80 mmHg  Pulse 66  Resp 12  Ht 5' 6.5" (1.689 m)  Wt 161 lb (73.029 kg)  BMI 25.60 kg/m2  LMP 08/18/1994 (Approximate)    General appearance: alert, cooperative and appears stated age Head: Normocephalic, without obvious abnormality, atraumatic Neck: no adenopathy, supple, symmetrical, trachea midline and thyroid normal to inspection and palpation Lungs: clear to auscultation bilaterally Breasts: normal appearance, no masses or tenderness, Inspection negative, No nipple retraction or dimpling on right.  Left lateral breast and chest wall with 5 x 3 cm lipoma palpated.  No change.  Nontender.  No nodes palpable or nipple retractions or  masses. Heart: regular rate and rhythm Abdomen:  soft, non-tender; no masses, no organomegaly Extremities: extremities normal, atraumatic, no cyanosis or edema Skin: Skin color, texture, turgor normal. No rashes or lesions Lymph nodes: Cervical, supraclavicular, and axillary nodes normal. No abnormal inguinal nodes palpated Neurologic: Grossly normal  Pelvic: External genitalia:  no lesions              Urethra:  normal appearing urethra with no masses, tenderness or lesions              Bartholins and Skenes: normal                 Vagina: normal appearing vagina with normal color and discharge, no lesions              Cervix: polyp removed with ring forceps w/ patient permission.  To pathology              Pap taken: No. Bimanual Exam:  Uterus:  normal size, contour, position, consistency, mobility, non-tender              Adnexa: normal adnexa and no mass, fullness, tenderness              Rectal exam: Yes.  .  Confirms.              Anus:  normal sphincter tone, no lesions  Chaperone was present for exam.  Assessment:   Well woman visit with normal exam. Hx LEEP in 2006.  Cervical polyp removed today.  Dyspareunia.  Using Femring and Prometrium. Osteopenia - mild, stable.  Hx of osteoporosis and Fosamax use. HRT patient. Left chest wall lipoma. Status post normal MRI of breast in 2012  Plan: Yearly mammogram recommended after age 74. Doing at the [facility]. Recommended self breast exam.  Pap and HR HPV as above. Discussed Calcium, Vitamin D, regular exercise program including cardiovascular and weight bearing exercise. Labs performed.  Yes.  .   See orders.  Cervical polyp to pathology.  Prescription medication(s) given.  Yes.  .  See orders.  HRT refilled.  Femring and Prometrium for one year.  Discussed risks of DVT, PE, MI, stroke and breast cancer.  Remove Femring for intercourse.  Use coconut oil for lubrication.  BMD this year.  Has been doing at [place].  We  will need to send them an order. Follow up annually and prn.       After visit summary provided.

## 2016-02-13 NOTE — Patient Instructions (Signed)
Health Maintenance, Female Adopting a healthy lifestyle and getting preventive care can go a long way to promote health and wellness. Talk with your health care provider about what schedule of regular examinations is right for you. This is a good chance for you to check in with your provider about disease prevention and staying healthy. In between checkups, there are plenty of things you can do on your own. Experts have done a lot of research about which lifestyle changes and preventive measures are most likely to keep you healthy. Ask your health care provider for more information. WEIGHT AND DIET  Eat a healthy diet  Be sure to include plenty of vegetables, fruits, low-fat dairy products, and lean protein.  Do not eat a lot of foods high in solid fats, added sugars, or salt.  Get regular exercise. This is one of the most important things you can do for your health.  Most adults should exercise for at least 150 minutes each week. The exercise should increase your heart rate and make you sweat (moderate-intensity exercise).  Most adults should also do strengthening exercises at least twice a week. This is in addition to the moderate-intensity exercise.  Maintain a healthy weight  Body mass index (BMI) is a measurement that can be used to identify possible weight problems. It estimates body fat based on height and weight. Your health care provider can help determine your BMI and help you achieve or maintain a healthy weight.  For females 20 years of age and older:   A BMI below 18.5 is considered underweight.  A BMI of 18.5 to 24.9 is normal.  A BMI of 25 to 29.9 is considered overweight.  A BMI of 30 and above is considered obese.  Watch levels of cholesterol and blood lipids  You should start having your blood tested for lipids and cholesterol at 70 years of age, then have this test every 5 years.  You may need to have your cholesterol levels checked more often if:  Your lipid  or cholesterol levels are high.  You are older than 70 years of age.  You are at high risk for heart disease.  CANCER SCREENING   Lung Cancer  Lung cancer screening is recommended for adults 55-80 years old who are at high risk for lung cancer because of a history of smoking.  A yearly low-dose CT scan of the lungs is recommended for people who:  Currently smoke.  Have quit within the past 15 years.  Have at least a 30-pack-year history of smoking. A pack year is smoking an average of one pack of cigarettes a day for 1 year.  Yearly screening should continue until it has been 15 years since you quit.  Yearly screening should stop if you develop a health problem that would prevent you from having lung cancer treatment.  Breast Cancer  Practice breast self-awareness. This means understanding how your breasts normally appear and feel.  It also means doing regular breast self-exams. Let your health care provider know about any changes, no matter how small.  If you are in your 20s or 30s, you should have a clinical breast exam (CBE) by a health care provider every 1-3 years as part of a regular health exam.  If you are 40 or older, have a CBE every year. Also consider having a breast X-ray (mammogram) every year.  If you have a family history of breast cancer, talk to your health care provider about genetic screening.  If you   are at high risk for breast cancer, talk to your health care provider about having an MRI and a mammogram every year.  Breast cancer gene (BRCA) assessment is recommended for women who have family members with BRCA-related cancers. BRCA-related cancers include:  Breast.  Ovarian.  Tubal.  Peritoneal cancers.  Results of the assessment will determine the need for genetic counseling and BRCA1 and BRCA2 testing. Cervical Cancer Your health care provider may recommend that you be screened regularly for cancer of the pelvic organs (ovaries, uterus, and  vagina). This screening involves a pelvic examination, including checking for microscopic changes to the surface of your cervix (Pap test). You may be encouraged to have this screening done every 3 years, beginning at age 21.  For women ages 30-65, health care providers may recommend pelvic exams and Pap testing every 3 years, or they may recommend the Pap and pelvic exam, combined with testing for human papilloma virus (HPV), every 5 years. Some types of HPV increase your risk of cervical cancer. Testing for HPV may also be done on women of any age with unclear Pap test results.  Other health care providers may not recommend any screening for nonpregnant women who are considered low risk for pelvic cancer and who do not have symptoms. Ask your health care provider if a screening pelvic exam is right for you.  If you have had past treatment for cervical cancer or a condition that could lead to cancer, you need Pap tests and screening for cancer for at least 20 years after your treatment. If Pap tests have been discontinued, your risk factors (such as having a new sexual partner) need to be reassessed to determine if screening should resume. Some women have medical problems that increase the chance of getting cervical cancer. In these cases, your health care provider may recommend more frequent screening and Pap tests. Colorectal Cancer  This type of cancer can be detected and often prevented.  Routine colorectal cancer screening usually begins at 70 years of age and continues through 70 years of age.  Your health care provider may recommend screening at an earlier age if you have risk factors for colon cancer.  Your health care provider may also recommend using home test kits to check for hidden blood in the stool.  A small camera at the end of a tube can be used to examine your colon directly (sigmoidoscopy or colonoscopy). This is done to check for the earliest forms of colorectal  cancer.  Routine screening usually begins at age 50.  Direct examination of the colon should be repeated every 5-10 years through 70 years of age. However, you may need to be screened more often if early forms of precancerous polyps or small growths are found. Skin Cancer  Check your skin from head to toe regularly.  Tell your health care provider about any new moles or changes in moles, especially if there is a change in a mole's shape or color.  Also tell your health care provider if you have a mole that is larger than the size of a pencil eraser.  Always use sunscreen. Apply sunscreen liberally and repeatedly throughout the day.  Protect yourself by wearing long sleeves, pants, a wide-brimmed hat, and sunglasses whenever you are outside. HEART DISEASE, DIABETES, AND HIGH BLOOD PRESSURE   High blood pressure causes heart disease and increases the risk of stroke. High blood pressure is more likely to develop in:  People who have blood pressure in the high end   of the normal range (130-139/85-89 mm Hg).  People who are overweight or obese.  People who are African American.  If you are 38-23 years of age, have your blood pressure checked every 3-5 years. If you are 61 years of age or older, have your blood pressure checked every year. You should have your blood pressure measured twice--once when you are at a hospital or clinic, and once when you are not at a hospital or clinic. Record the average of the two measurements. To check your blood pressure when you are not at a hospital or clinic, you can use:  An automated blood pressure machine at a pharmacy.  A home blood pressure monitor.  If you are between 45 years and 39 years old, ask your health care provider if you should take aspirin to prevent strokes.  Have regular diabetes screenings. This involves taking a blood sample to check your fasting blood sugar level.  If you are at a normal weight and have a low risk for diabetes,  have this test once every three years after 70 years of age.  If you are overweight and have a high risk for diabetes, consider being tested at a younger age or more often. PREVENTING INFECTION  Hepatitis B  If you have a higher risk for hepatitis B, you should be screened for this virus. You are considered at high risk for hepatitis B if:  You were born in a country where hepatitis B is common. Ask your health care provider which countries are considered high risk.  Your parents were born in a high-risk country, and you have not been immunized against hepatitis B (hepatitis B vaccine).  You have HIV or AIDS.  You use needles to inject street drugs.  You live with someone who has hepatitis B.  You have had sex with someone who has hepatitis B.  You get hemodialysis treatment.  You take certain medicines for conditions, including cancer, organ transplantation, and autoimmune conditions. Hepatitis C  Blood testing is recommended for:  Everyone born from [birth year] through 1965.  Anyone with known risk factors for hepatitis C. Sexually transmitted infections (STIs)  You should be screened for sexually transmitted infections (STIs) including gonorrhea and chlamydia if:  You are sexually active and are younger than 70 years of age.  You are older than 70 years of age and your health care provider tells you that you are at risk for this type of infection.  Your sexual activity has changed since you were last screened and you are at an increased risk for chlamydia or gonorrhea. Ask your health care provider if you are at risk.  If you do not have HIV, but are at risk, it may be recommended that you take a prescription medicine daily to prevent HIV infection. This is called pre-exposure prophylaxis (PrEP). You are considered at risk if:  You are sexually active and do not regularly use condoms or know the HIV status of your partner(s).  You take drugs by injection.  You are sexually  active with a partner who has HIV. Talk with your health care provider about whether you are at high risk of being infected with HIV. If you choose to begin PrEP, you should first be tested for HIV. You should then be tested every 3 months for as long as you are taking PrEP.  PREGNANCY   If you are premenopausal and you may become pregnant, ask your health care provider about preconception counseling.  If you may  become pregnant, take 400 to 800 micrograms (mcg) of folic acid every day.  If you want to prevent pregnancy, talk to your health care provider about birth control (contraception). OSTEOPOROSIS AND MENOPAUSE   Osteoporosis is a disease in which the bones lose minerals and strength with aging. This can result in serious bone fractures. Your risk for osteoporosis can be identified using a bone density scan.  If you are 29 years of age or older, or if you are at risk for osteoporosis and fractures, ask your health care provider if you should be screened.  Ask your health care provider whether you should take a calcium or vitamin D supplement to lower your risk for osteoporosis.  Menopause may have certain physical symptoms and risks.  Hormone replacement therapy may reduce some of these symptoms and risks. Talk to your health care provider about whether hormone replacement therapy is right for you.  HOME CARE INSTRUCTIONS   Schedule regular health, dental, and eye exams.  Stay current with your immunizations.   Do not use any tobacco products including cigarettes, chewing tobacco, or electronic cigarettes.  If you are pregnant, do not drink alcohol.  If you are breastfeeding, limit how much and how often you drink alcohol.  Limit alcohol intake to no more than 1 drink per day for nonpregnant women. One drink equals 12 ounces of beer, 5 ounces of wine, or 1 ounces of hard liquor.  Do not use street drugs.  Do not share needles.  Ask your health care provider for help if  you need support or information about quitting drugs.  Tell your health care provider if you often feel depressed.  Tell your health care provider if you have ever been abused or do not feel safe at home.   This information is not intended to replace advice given to you by your health care provider. Make sure you discuss any questions you have with your health care provider.   Document Released: 02/17/2011 Document Revised: 08/25/2014 Document Reviewed: 07/06/2013 Elsevier Interactive Patient Education [employer].

## 2016-02-15 LAB — IPS OTHER TISSUE BIOPSY

## 2016-02-20 DIAGNOSIS — R52 Pain, unspecified: Secondary | ICD-10-CM | POA: Insufficient documentation

## 2016-02-20 DIAGNOSIS — M79644 Pain in right finger(s): Secondary | ICD-10-CM | POA: Diagnosis not present

## 2016-02-21 ENCOUNTER — Other Ambulatory Visit: Payer: Self-pay | Admitting: Orthopedic Surgery

## 2016-02-21 DIAGNOSIS — M79641 Pain in right hand: Secondary | ICD-10-CM

## 2016-02-27 ENCOUNTER — Ambulatory Visit
Admission: RE | Admit: 2016-02-27 | Discharge: 2016-02-27 | Disposition: A | Discharge status: Home / Self Care | Payer: Medicare Other | Source: Ambulatory Visit | Attending: Orthopedic Surgery | Admitting: Orthopedic Surgery

## 2016-02-27 DIAGNOSIS — M79641 Pain in right hand: Secondary | ICD-10-CM

## 2016-02-27 DIAGNOSIS — M7989 Other specified soft tissue disorders: Secondary | ICD-10-CM | POA: Diagnosis not present

## 2016-03-03 DIAGNOSIS — M6588 Other synovitis and tenosynovitis, other site: Secondary | ICD-10-CM | POA: Diagnosis not present

## 2016-03-04 ENCOUNTER — Other Ambulatory Visit: Payer: Self-pay | Admitting: Orthopedic Surgery

## 2016-03-04 ENCOUNTER — Encounter (HOSPITAL_BASED_OUTPATIENT_CLINIC_OR_DEPARTMENT_OTHER): Payer: Self-pay | Admitting: *Deleted

## 2016-03-10 ENCOUNTER — Encounter (HOSPITAL_BASED_OUTPATIENT_CLINIC_OR_DEPARTMENT_OTHER): Payer: Self-pay

## 2016-03-11 ENCOUNTER — Ambulatory Visit (HOSPITAL_BASED_OUTPATIENT_CLINIC_OR_DEPARTMENT_OTHER)
Admission: RE | Admit: 2016-03-11 | Discharge: 2016-03-11 | Disposition: A | Discharge status: Home / Self Care | Payer: Medicare Other | Source: Ambulatory Visit | Attending: Orthopedic Surgery | Admitting: Orthopedic Surgery

## 2016-03-11 ENCOUNTER — Ambulatory Visit (HOSPITAL_BASED_OUTPATIENT_CLINIC_OR_DEPARTMENT_OTHER): Payer: Medicare Other | Admitting: Anesthesiology

## 2016-03-11 ENCOUNTER — Encounter (HOSPITAL_BASED_OUTPATIENT_CLINIC_OR_DEPARTMENT_OTHER)
Admission: RE | Disposition: A | Discharge status: Home / Self Care | Payer: Self-pay | Source: Ambulatory Visit | Attending: Orthopedic Surgery

## 2016-03-11 ENCOUNTER — Encounter (HOSPITAL_BASED_OUTPATIENT_CLINIC_OR_DEPARTMENT_OTHER): Payer: Self-pay | Admitting: Anesthesiology

## 2016-03-11 DIAGNOSIS — Z87891 Personal history of nicotine dependence: Secondary | ICD-10-CM | POA: Diagnosis not present

## 2016-03-11 DIAGNOSIS — M65841 Other synovitis and tenosynovitis, right hand: Secondary | ICD-10-CM | POA: Diagnosis not present

## 2016-03-11 DIAGNOSIS — Z7951 Long term (current) use of inhaled steroids: Secondary | ICD-10-CM | POA: Diagnosis not present

## 2016-03-11 DIAGNOSIS — I10 Essential (primary) hypertension: Secondary | ICD-10-CM | POA: Diagnosis not present

## 2016-03-11 DIAGNOSIS — Z79899 Other long term (current) drug therapy: Secondary | ICD-10-CM | POA: Insufficient documentation

## 2016-03-11 HISTORY — PX: TENOSYNOVECTOMY: SHX6110

## 2016-03-11 SURGERY — TENOSYNOVECTOMY
Anesthesia: Monitor Anesthesia Care | Laterality: Right

## 2016-03-11 MED ORDER — LIDOCAINE HCL (PF) 0.5 % IJ SOLN
INTRAMUSCULAR | Status: DC | PRN
  Administered 2016-03-11: 50 mL via INTRAVENOUS

## 2016-03-11 MED ORDER — GLYCOPYRROLATE 0.2 MG/ML IJ SOLN: 0.2000 mg | Freq: Once | INTRAMUSCULAR | Status: DC | PRN

## 2016-03-11 MED ORDER — DOXYCYCLINE HYCLATE 100 MG PO TABS: 100.0000 mg | ORAL_TABLET | Freq: Two times a day (BID) | ORAL | Status: DC

## 2016-03-11 MED ORDER — PROPOFOL 500 MG/50ML IV EMUL
INTRAVENOUS | Status: DC | PRN
  Administered 2016-03-11: 45 ug/kg/min via INTRAVENOUS

## 2016-03-11 MED ORDER — ONDANSETRON HCL 4 MG/2ML IJ SOLN
INTRAMUSCULAR | Status: DC | PRN
  Administered 2016-03-11: 4 mg via INTRAVENOUS

## 2016-03-11 MED ORDER — HYDROCODONE-ACETAMINOPHEN 5-325 MG PO TABS
1.0000 | ORAL_TABLET | Freq: Four times a day (QID) | ORAL | 0 refills | Status: DC | PRN
Start: 2016-03-11 — End: 2017-02-27

## 2016-03-11 MED ORDER — SCOPOLAMINE 1 MG/3DAYS TD PT72: 1.0000 | MEDICATED_PATCH | Freq: Once | TRANSDERMAL | Status: DC | PRN

## 2016-03-11 MED ORDER — BUPIVACAINE HCL (PF) 0.25 % IJ SOLN
INTRAMUSCULAR | Status: DC | PRN
  Administered 2016-03-11: 6 mL

## 2016-03-11 MED ORDER — CEFAZOLIN SODIUM-DEXTROSE 2-4 GM/100ML-% IV SOLN
INTRAVENOUS | Status: AC
  Filled 2016-03-11: qty 100

## 2016-03-11 MED ORDER — FENTANYL CITRATE (PF) 100 MCG/2ML IJ SOLN: 25.0000 ug | INTRAMUSCULAR | Status: DC | PRN

## 2016-03-11 MED ORDER — LACTATED RINGERS IV SOLN
INTRAVENOUS | Status: DC
  Administered 2016-03-11: 11:00:00 via INTRAVENOUS

## 2016-03-11 MED ORDER — MEPERIDINE HCL 25 MG/ML IJ SOLN: 6.2500 mg | INTRAMUSCULAR | Status: DC | PRN

## 2016-03-11 MED ORDER — FENTANYL CITRATE (PF) 100 MCG/2ML IJ SOLN
INTRAMUSCULAR | Status: DC | PRN
  Administered 2016-03-11: 100 ug via INTRAVENOUS

## 2016-03-11 MED ORDER — FENTANYL CITRATE (PF) 100 MCG/2ML IJ SOLN: 50.0000 ug | INTRAMUSCULAR | Status: DC | PRN

## 2016-03-11 MED ORDER — ONDANSETRON HCL 4 MG/2ML IJ SOLN
INTRAMUSCULAR | Status: AC
  Filled 2016-03-11: qty 2

## 2016-03-11 MED ORDER — CHLORHEXIDINE GLUCONATE 4 % EX LIQD: 60.0000 mL | Freq: Once | CUTANEOUS | Status: DC

## 2016-03-11 MED ORDER — FENTANYL CITRATE (PF) 100 MCG/2ML IJ SOLN
INTRAMUSCULAR | Status: AC
  Filled 2016-03-11: qty 2

## 2016-03-11 MED ORDER — MIDAZOLAM HCL 2 MG/2ML IJ SOLN: 1.0000 mg | INTRAMUSCULAR | Status: DC | PRN

## 2016-03-11 MED ORDER — CEFAZOLIN SODIUM-DEXTROSE 2-4 GM/100ML-% IV SOLN
2.0000 g | INTRAVENOUS | Status: AC
  Administered 2016-03-11: 2 g via INTRAVENOUS

## 2016-03-11 MED ORDER — LIDOCAINE 2% (20 MG/ML) 5 ML SYRINGE
INTRAMUSCULAR | Status: AC
Start: 2016-03-11 — End: 2016-03-11
  Filled 2016-03-11: qty 5

## 2016-03-11 SURGICAL SUPPLY — 56 items
BAG DECANTER FOR FLEXI CONT (MISCELLANEOUS) IMPLANT
BLADE MINI RND TIP GREEN BEAV (BLADE) IMPLANT
BLADE SURG 15 STRL LF DISP TIS (BLADE) ×1 IMPLANT
BLADE SURG 15 STRL SS (BLADE) ×3
BNDG CMPR 9X4 STRL LF SNTH (GAUZE/BANDAGES/DRESSINGS)
BNDG COHESIVE 1X5 TAN STRL LF (GAUZE/BANDAGES/DRESSINGS) ×2 IMPLANT
BNDG COHESIVE 3X5 TAN STRL LF (GAUZE/BANDAGES/DRESSINGS) ×3 IMPLANT
BNDG ESMARK 4X9 LF (GAUZE/BANDAGES/DRESSINGS) IMPLANT
BNDG GAUZE ELAST 4 BULKY (GAUZE/BANDAGES/DRESSINGS) ×3 IMPLANT
CHLORAPREP W/TINT 26ML (MISCELLANEOUS) ×3 IMPLANT
CORDS BIPOLAR (ELECTRODE) ×3 IMPLANT
COVER BACK TABLE 60X90IN (DRAPES) ×3 IMPLANT
COVER MAYO STAND STRL (DRAPES) ×3 IMPLANT
CUFF TOURNIQUET SINGLE 18IN (TOURNIQUET CUFF) ×2 IMPLANT
DECANTER SPIKE VIAL GLASS SM (MISCELLANEOUS) IMPLANT
DRAPE EXTREMITY T 121X128X90 (DRAPE) ×3 IMPLANT
DRAPE SURG 17X23 STRL (DRAPES) ×3 IMPLANT
GAUZE SPONGE 4X4 12PLY STRL (GAUZE/BANDAGES/DRESSINGS) ×3 IMPLANT
GAUZE XEROFORM 1X8 LF (GAUZE/BANDAGES/DRESSINGS) ×3 IMPLANT
GLOVE BIOGEL PI IND STRL 7.0 (GLOVE) IMPLANT
GLOVE BIOGEL PI IND STRL 8.5 (GLOVE) ×1 IMPLANT
GLOVE BIOGEL PI INDICATOR 7.0 (GLOVE) ×4
GLOVE BIOGEL PI INDICATOR 8.5 (GLOVE) ×2
GLOVE ECLIPSE 6.5 STRL STRAW (GLOVE) ×2 IMPLANT
GLOVE SURG ORTHO 8.0 STRL STRW (GLOVE) ×3 IMPLANT
GOWN STRL REUS W/ TWL LRG LVL3 (GOWN DISPOSABLE) ×1 IMPLANT
GOWN STRL REUS W/TWL LRG LVL3 (GOWN DISPOSABLE) ×3
GOWN STRL REUS W/TWL XL LVL3 (GOWN DISPOSABLE) ×3 IMPLANT
LOOP VESSEL MAXI BLUE (MISCELLANEOUS) ×2 IMPLANT
NDL KEITH (NEEDLE) IMPLANT
NDL PRECISIONGLIDE 27X1.5 (NEEDLE) IMPLANT
NEEDLE KEITH (NEEDLE) IMPLANT
NEEDLE PRECISIONGLIDE 27X1.5 (NEEDLE) IMPLANT
NS IRRIG 1000ML POUR BTL (IV SOLUTION) ×3 IMPLANT
PACK BASIN DAY SURGERY FS (CUSTOM PROCEDURE TRAY) ×3 IMPLANT
PAD CAST 3X4 CTTN HI CHSV (CAST SUPPLIES) ×1 IMPLANT
PADDING CAST ABS 4INX4YD NS (CAST SUPPLIES) ×2
PADDING CAST ABS COTTON 4X4 ST (CAST SUPPLIES) ×1 IMPLANT
PADDING CAST COTTON 3X4 STRL (CAST SUPPLIES) ×15
SLEEVE SCD COMPRESS KNEE MED (MISCELLANEOUS) IMPLANT
SPLINT PLASTER CAST XFAST 3X15 (CAST SUPPLIES) IMPLANT
SPLINT PLASTER XTRA FASTSET 3X (CAST SUPPLIES)
STOCKINETTE 4X48 STRL (DRAPES) ×3 IMPLANT
SUT ETHILON 4 0 PS 2 18 (SUTURE) ×3 IMPLANT
SUT MERSILENE 4 0 P 3 (SUTURE) IMPLANT
SUT SILK 4 0 PS 2 (SUTURE) IMPLANT
SUT VIC AB 4-0 P-3 18XBRD (SUTURE) IMPLANT
SUT VIC AB 4-0 P3 18 (SUTURE)
SUT VICRYL 4-0 PS2 18IN ABS (SUTURE) IMPLANT
SWAB COLLECTION DEVICE MRSA (MISCELLANEOUS) ×2 IMPLANT
SWAB CULTURE ESWAB REG 1ML (MISCELLANEOUS) ×2 IMPLANT
SYR BULB 3OZ (MISCELLANEOUS) ×3 IMPLANT
SYR CONTROL 10ML LL (SYRINGE) IMPLANT
TOWEL OR 17X24 6PK STRL BLUE (TOWEL DISPOSABLE) ×6 IMPLANT
TUBE FEEDING 5FR 15 INCH (TUBING) IMPLANT
UNDERPAD 30X30 (UNDERPADS AND DIAPERS) ×3 IMPLANT

## 2016-03-11 NOTE — Brief Op Note (Signed)
03/11/2016  12:28 PM  PATIENT:  Marcelene Butte  70 y.o. female  PRE-OPERATIVE DIAGNOSIS:  FLEXON TENOSYNOVECTOMY RIGHT RING FINGER  POST-OPERATIVE DIAGNOSIS:  FLEXON TENOSYNOVECTOMY RIGHT RING FINGER  PROCEDURE:  Procedure(s): TENOSYNOVECTOMY FLEXOR RIGHT RING FINGER (Right)  SURGEON:  Surgeon(s) and Role:    * Daryll Brod, MD - Primary  PHYSICIAN ASSISTANT:   ASSISTANTS: none   ANESTHESIA:   local and regional  EBL:  Total I/O In: -  Out: 3 [Blood:3]  BLOOD ADMINISTERED:none  DRAINS: Penrose drain in the finger   LOCAL MEDICATIONS USED:  BUPIVICAINE   SPECIMEN:  Excision  DISPOSITION OF SPECIMEN:  PATHOLOGY  COUNTS:  YES  TOURNIQUET:   Total Tourniquet Time Documented: Upper Arm (Right) - 32 minutes Total: Upper Arm (Right) - 32 minutes   DICTATION: .Other Dictation: Dictation Number [id number]  PLAN OF CARE: Discharge to home after PACU  PATIENT DISPOSITION:  PACU - hemodynamically stable.

## 2016-03-11 NOTE — Anesthesia Procedure Notes (Signed)
Procedure Name: MAC Date/Time: 03/11/2016 11:55 AM Performed by: Marrianne Mood Pre-anesthesia Checklist: Patient identified, Emergency Drugs available, Suction available, Patient being monitored and Timeout performed Patient Re-evaluated:Patient Re-evaluated prior to inductionOxygen Delivery Method: Simple face mask Preoxygenation: Pre-oxygenation with 100% oxygen

## 2016-03-11 NOTE — Op Note (Signed)
Dictation Number [id number]

## 2016-03-11 NOTE — H&P (Signed)
Ann Vang is an 70 y.o. female.   Chief Complaint: swelling right ring finger HPI: Ann Vang is a 71 year old left hand dominant female who comes in with complaint of pain and swelling to her right ring finger. This has been going on for approximately 2 months. She has had 2 injections by Dr. Amedeo Plenty. Dr. Amedeo Plenty did a carpal tunnel release on her. She has seen Dr. Daylene Katayama in the past for [facility] arthritis. She is complaining of a swollen finger PIP joint level proximal phalanx onto the hand. She has no history of injury. She [place] the beach with her brother who is a Software engineer and told her to take Motrin 400 mg 4 times a day which she did. States this gave her minimal relief. She is complaining of a throbbing type pain with BAS scare 3/10. She has had the 2 steroid injections which have not helped. She has a family history of gout. She has no history of diabetes, thyroid problems, arthritis or gout herself. She has not been tested for this.She has had her MRI done of her finger. This revealed swelling along the entire flexor sheath of the right ring finger. This has been going on for at least 2 months. She has had 2 injections by Dr. Amedeo Plenty following her carpal tunnel release. She is not complaining of significant increase in her pain. She is not complaining of any fevers or chills      FAMILY HISTORY: Negative for diabetes, thyroid problems and arthritis.   She has no known allergies. She has a history of arthritis, heart failure and hypertension.  Past Medical History:  Diagnosis Date  . Arthritis   Past Surgical History:  Procedure Laterality Date  . KNEE ARTHROSCOPY  . KNEE SURGERY  . thumb surgery Right  tumor removed  . TONSILECTOMY, ADENOIDECTOMY, BILATERAL MYRINGOTOMY AND TUBES       Past Medical History:  Diagnosis Date  . Abnormal breast finding 12/1998   left breast abnl  . Arthritis    --basal joint--right hand  . Carpal tunnel syndrome, right   . Diverticulosis  2003   mild  . Lumbar disc disorder 2000   bulging  . Menopause   . Osteoporosis    Fx L4 age 39 water skiing accident  . Thyroid disease 10/2014   thyroid nodules--bx'd large nodule and was benign--sees Gen. surgeon with [facility]    Past Surgical History:  Procedure Laterality Date  . AUGMENTATION MAMMAPLASTY  1983  . BREAST SURGERY  2010   implants removed ruptured  . CERVICAL BIOPSY  W/ LOOP ELECTRODE EXCISION  1996   [facility]  . KNEE SURGERY  2012   meniscus rt knee (both)  . L-2 fracture  2009   fall in aerobics  . L-4 fracture   water skiing    . thyroid nodule biopsy  10/2014   --[facility], [place]--  . TONSILLECTOMY AND ADENOIDECTOMY  1964    Family History  Problem Relation Age of Onset  . Osteoporosis Mother   . Alzheimer's disease Mother   . Hypertension Father   . Osteoporosis Maternal Grandmother   . Colon cancer Maternal Grandmother   . Colon cancer Other    Social History:  reports that she quit smoking about 43 years ago. She has never used smokeless tobacco. She reports that she drinks about 8.4 oz of alcohol per week . She reports that she does not use drugs.  Allergies:  Allergies  Allergen Reactions  . Macrobid [employer]  Macrocrystal] Nausea Only    Medications Prior to Admission  Medication Sig Dispense Refill  . Cholecalciferol (VITAMIN D) 2000 UNITS tablet Take 2,000 Units by mouth daily.    . Estradiol Acetate (FEMRING) 0.05 MG/24HR RING Place 1 each vaginally every 3 (three) months. 1 each 3  . fluticasone (FLONASE) 50 MCG/ACT nasal spray Place 2 sprays into the nose daily. 48 g 1  . progesterone (PROMETRIUM) 100 MG capsule Take 1 capsule (100 mg total) by mouth daily. 90 capsule 3    No results found for this or any previous visit (from the past 48 hour(s)).  No results found.   Pertinent items are noted in HPI.  Height 5\' 7"  (1.702 m), weight 71.7 kg (158 lb), last menstrual period 08/18/1994.  General appearance: alert,  cooperative and appears stated age Head: Normocephalic, without obvious abnormality Neck: no JVD Resp: clear to auscultation bilaterally Cardio: regular rate and rhythm, S1, S2 normal, no murmur, click, rub or gallop GI: soft, non-tender; bowel sounds normal; no masses,  no organomegaly Extremities: swelling right ring finger Pulses: 2+ and symmetric Skin: Skin color, texture, turgor normal. No rashes or lesions Neurologic: Grossly normal Incision/Wound: na  Assessment/Plan Assessment:  1. Pain  2. Flexor tenosynovitis of finger    Plan: We have discussed this with she and her husband. This does not appear to be a bacterial type infection. They are advised there is always the potential this could be an atypical mycobacteria or fungal infection. There is always the potential it could be a very low grade bacterial infection. We discussed the possibility of a tenosynovectomy with them. She would like to proceed to have that done. This would give a diagnosis plus treatment. This will be scheduled as an outpatient under regional anesthesia for tenosynovectomy flexor tendon right ring finger      Cephas Revard R 03/11/2016, 10:38 AM

## 2016-03-11 NOTE — Transfer of Care (Signed)
Immediate Anesthesia Transfer of Care Note  Patient: Ann Vang  Procedure(s) Performed: Procedure(s): TENOSYNOVECTOMY FLEXOR RIGHT RING FINGER (Right)  Patient Location: PACU  Anesthesia Type:Bier block  Level of Consciousness: awake and patient cooperative  Airway & Oxygen Therapy: Patient Spontanous Breathing and Patient connected to face mask oxygen  Post-op Assessment: Report given to RN and Post -op Vital signs reviewed and stable  Post vital signs: Reviewed and stable  Last Vitals:  Vitals:   03/11/16 1047  BP: (!) 143/67  Pulse: 76  Resp: 18  Temp: 36.8 C    Last Pain:  Vitals:   03/11/16 1047  TempSrc: Oral      Patients Stated Pain Goal: 0 ([id number])  Complications: No apparent anesthesia complications

## 2016-03-11 NOTE — Discharge Instructions (Addendum)
[facility] Instructions °Hand Surgery ° °Wound Care: °Keep your hand elevated above the level of your heart.  Do not allow it to dangle by your side.  Keep the dressing dry and do not remove it unless your doctor advises you to do so.  He will usually change it at the time of your post-op visit.  Moving your fingers is advised to stimulate circulation but will depend on the site of your surgery.  If you have a splint applied, your doctor will advise you regarding movement. ° °Activity: °Do not drive or operate machinery today.  Rest today and then you may return to your normal activity and work as indicated by your physician. ° °Diet:  °Drink liquids today or eat a light diet.  You may resume a regular diet tomorrow.   ° °General expectations: °Pain for two to three days. °Fingers may become slightly swollen. ° °Call your doctor if any of the following occur: °Severe pain not relieved by pain medication. °Elevated temperature. °Dressing soaked with blood. °Inability to move fingers. °White or bluish color to fingers. ° ° ° °Post Anesthesia Home Care Instructions ° °Activity: °Get plenty of rest for the remainder of the day. A responsible adult should stay with you for 24 hours following the procedure.  °For the next 24 hours, DO NOT: °-Drive a car °-Operate machinery °-Drink alcoholic beverages °-Take any medication unless instructed by your physician °-Make any legal decisions or sign important papers. ° °Meals: °Start with liquid foods such as gelatin or soup. Progress to regular foods as tolerated. Avoid greasy, spicy, heavy foods. If nausea and/or vomiting occur, drink only clear liquids until the nausea and/or vomiting subsides. Call your physician if vomiting continues. ° °Special Instructions/Symptoms: °Your throat may feel dry or sore from the anesthesia or the breathing tube placed in your throat during surgery. If this causes discomfort, gargle with warm salt water. The discomfort should disappear within  24 hours. ° °If you had a scopolamine patch placed behind your ear for the management of post- operative nausea and/or vomiting: ° °1. The medication in the patch is effective for 72 hours, after which it should be removed.  Wrap patch in a tissue and discard in the trash. Wash hands thoroughly with soap and water. °2. You may remove the patch earlier than 72 hours if you experience unpleasant side effects which may include dry mouth, dizziness or visual disturbances. °3. Avoid touching the patch. Wash your hands with soap and water after contact with the patch. °  °Call your surgeon if you experience:  ° °1.  Fever over 101.0. °2.  Inability to urinate. °3.  Nausea and/or vomiting. °4.  Extreme swelling or bruising at the surgical site. °5.  Continued bleeding from the incision. °6.  Increased pain, redness or drainage from the incision. °7.  Problems related to your pain medication. °8.  Any problems and/or concerns °

## 2016-03-11 NOTE — Anesthesia Procedure Notes (Signed)
Anesthesia Regional Block:  Bier block (IV Regional)  Pre-Anesthetic Checklist: ,, timeout performed, Correct Patient, Correct Site, Correct Laterality, Correct Position,,, risks and benefits discussed,,,  Laterality: Right  Prep: chloraprep       Bier block (IV Regional) Narrative:

## 2016-03-11 NOTE — Anesthesia Preprocedure Evaluation (Signed)
Anesthesia Evaluation  Patient identified by MRN, date of birth, ID band Patient awake    Reviewed: Allergy & Precautions, NPO status , Patient's Chart, lab work & pertinent test results  Airway Mallampati: I  TM Distance: >3 FB Neck ROM: Full    Dental  (+) Teeth Intact, Dental Advisory Given   Pulmonary former smoker,    breath sounds clear to auscultation       Cardiovascular  Rhythm:Regular Rate:Normal     Neuro/Psych    GI/Hepatic   Endo/Other    Renal/GU      Musculoskeletal   Abdominal   Peds  Hematology   Anesthesia Other Findings   Reproductive/Obstetrics                             Anesthesia Physical Anesthesia Plan  ASA: I  Anesthesia Plan: MAC and Bier Block   Post-op Pain Management:    Induction: Intravenous  Airway Management Planned: Simple Face Mask  Additional Equipment:   Intra-op Plan:   Post-operative Plan:   Informed Consent: I have reviewed the patients History and Physical, chart, labs and discussed the procedure including the risks, benefits and alternatives for the proposed anesthesia with the patient or authorized representative who has indicated his/her understanding and acceptance.   Dental advisory given  Plan Discussed with: CRNA, Anesthesiologist and Surgeon  Anesthesia Plan Comments:         Anesthesia Quick Evaluation

## 2016-03-11 NOTE — Anesthesia Postprocedure Evaluation (Signed)
Anesthesia Post Note  Patient: Ann Vang  Procedure(s) Performed: Procedure(s) (LRB): TENOSYNOVECTOMY FLEXOR RIGHT RING FINGER (Right)  Patient location during evaluation: PACU Anesthesia Type: MAC Level of consciousness: awake and alert Pain management: pain level controlled Vital Signs Assessment: post-procedure vital signs reviewed and stable Respiratory status: spontaneous breathing, nonlabored ventilation and respiratory function stable Cardiovascular status: stable and blood pressure returned to baseline Anesthetic complications: no    Last Vitals:  Vitals:   03/11/16 1300 03/11/16 1320  BP: 135/70 (!) 145/55  Pulse: 64 68  Resp: 16 16  Temp:  36.8 C    Last Pain:  Vitals:   03/11/16 1320  TempSrc: Oral  PainSc:                  [name] A

## 2016-03-12 NOTE — Op Note (Signed)
NAMELYNDSEE, STOIA NO.:  [id number]  MEDICAL RECORD NO.:  [id number]  LOCATION:                                 FACILITY:  PHYSICIAN:  Daryll Brod, M.D.            DATE OF BIRTH:  DATE OF PROCEDURE:  03/11/2016 DATE OF DISCHARGE:                              OPERATIVE REPORT   PREOPERATIVE DIAGNOSIS:  Flexor tenosynovitis, right ring finger.  POSTOPERATIVE DIAGNOSIS:  Flexor tenosynovitis, right ring finger.  OPERATION:  Tenosynovectomy, right ring finger.  SURGEON:  Daryll Brod, M.D.  ANESTHESIA:  Upper arm IV regional with local infiltration.  ANESTHESIOLOGIST:  Lorrene Reid, M.D.  HISTORY:  The patient is a 70 year old female with a history of swelling of her right ring finger.  This has not responded to conservative treatment.  She has had an MRI done revealing swelling along the flexor sheath of the right ring finger.  Pre, peri and postoperative course have been discussed along with risks and complications.  She is aware that there was no guarantee with the surgery; the possibility of infection; recurrence of injury to arteries, nerves, tendons; incomplete relief of symptoms and dystrophy.  In the preoperative area, the patient is seen, the extremity marked by both patient and surgeon.  Antibiotic was not given.  PROCEDURE IN DETAIL:  The patient was brought to the operating room where an upper arm IV regional anesthetic was carried out without difficulty.  She was prepped using ChloraPrep, supine position with right arm free.  A 3-minute dry-time was taken, time-out confirmed patient and procedure.  An upper arm IV regional anesthetic had been carried out.  Adequate anesthesia was afforded.  A volar Bruner incision was made over the right ring finger, carried down through the subcutaneous tissue.  Bleeders were electrocauterized with bipolar.  The swelling along the flexor sheath was immediately apparent with an obvious tenosynovitis.  Just  proximal to the A1 pulley, this was opened, cultures were taken.  The A3 pulley and C-pulleys were then opened.  A further tenosynovectomy was performed at that level both proximal and distal to the A3 pulley.  The wound was extended distally onto the middle phalanx and further debridement was performed.  A small rongeur was used to remove the tenosynovial tissue proximally and distally. Cultures were sent for aerobic, anaerobic, fungal and AFB cultures.  The specimen was also sent to Pathology.  The wound was copiously irrigated with saline.  The skin was then closed over a double vessel-loop drain with interrupted 4-0 nylon sutures.  A local infiltration with 0.25% bupivacaine and metacarpal block was given.  Approximately 6 mL total was used.  A sterile compressive dressing and splint over the entire hand was then applied.  On deflation of the tourniquet, all fingers were immediately pinked.  She was taken to the recovery room for observation in satisfactory condition. She will be discharged to home to return to [place] in 1 week.  She was placed on Norco 5/325 and she was also placed on doxycycline 100 mg 1 p.o. b.i.d.          ______________________________ Daryll Brod, M.D.  GK/MEDQ  D:  03/11/2016  T:  03/12/2016  Job:  [id number]

## 2016-03-14 ENCOUNTER — Encounter (HOSPITAL_BASED_OUTPATIENT_CLINIC_OR_DEPARTMENT_OTHER): Payer: Self-pay | Admitting: Orthopedic Surgery

## 2016-03-14 DIAGNOSIS — M79644 Pain in right finger(s): Secondary | ICD-10-CM | POA: Diagnosis not present

## 2016-03-14 DIAGNOSIS — M659 Synovitis and tenosynovitis, unspecified: Secondary | ICD-10-CM | POA: Insufficient documentation

## 2016-03-16 LAB — AEROBIC/ANAEROBIC CULTURE (SURGICAL/DEEP WOUND): CULTURE: NO GROWTH

## 2016-03-16 LAB — AEROBIC/ANAEROBIC CULTURE W GRAM STAIN (SURGICAL/DEEP WOUND)

## 2016-03-17 LAB — ACID FAST SMEAR (AFB)

## 2016-03-17 LAB — ACID FAST SMEAR (AFB, MYCOBACTERIA): Acid Fast Smear: NEGATIVE

## 2016-03-18 DIAGNOSIS — M79644 Pain in right finger(s): Secondary | ICD-10-CM | POA: Diagnosis not present

## 2016-03-18 DIAGNOSIS — M25649 Stiffness of unspecified hand, not elsewhere classified: Secondary | ICD-10-CM | POA: Diagnosis not present

## 2016-03-21 DIAGNOSIS — M25649 Stiffness of unspecified hand, not elsewhere classified: Secondary | ICD-10-CM | POA: Diagnosis not present

## 2016-04-09 LAB — FUNGUS CULTURE WITH STAIN

## 2016-04-09 LAB — FUNGUS CULTURE RESULT

## 2016-04-09 LAB — FUNGAL ORGANISM REFLEX

## 2016-04-27 DIAGNOSIS — J111 Influenza due to unidentified influenza virus with other respiratory manifestations: Secondary | ICD-10-CM | POA: Diagnosis not present

## 2016-04-29 LAB — ACID FAST CULTURE WITH REFLEXED SENSITIVITIES (MYCOBACTERIA): Acid Fast Culture: NEGATIVE

## 2016-04-30 DIAGNOSIS — E041 Nontoxic single thyroid nodule: Secondary | ICD-10-CM | POA: Diagnosis not present

## 2016-04-30 DIAGNOSIS — Z0000 Encounter for general adult medical examination without abnormal findings: Secondary | ICD-10-CM | POA: Diagnosis not present

## 2016-04-30 DIAGNOSIS — N390 Urinary tract infection, site not specified: Secondary | ICD-10-CM | POA: Diagnosis not present

## 2016-04-30 DIAGNOSIS — R8299 Other abnormal findings in urine: Secondary | ICD-10-CM | POA: Diagnosis not present

## 2016-04-30 DIAGNOSIS — M810 Age-related osteoporosis without current pathological fracture: Secondary | ICD-10-CM | POA: Diagnosis not present

## 2016-05-07 DIAGNOSIS — G5601 Carpal tunnel syndrome, right upper limb: Secondary | ICD-10-CM | POA: Diagnosis not present

## 2016-05-07 DIAGNOSIS — M810 Age-related osteoporosis without current pathological fracture: Secondary | ICD-10-CM | POA: Diagnosis not present

## 2016-05-07 DIAGNOSIS — Z1389 Encounter for screening for other disorder: Secondary | ICD-10-CM | POA: Diagnosis not present

## 2016-05-07 DIAGNOSIS — Z0000 Encounter for general adult medical examination without abnormal findings: Secondary | ICD-10-CM | POA: Diagnosis not present

## 2016-05-07 DIAGNOSIS — S92309S Fracture of unspecified metatarsal bone(s), unspecified foot, sequela: Secondary | ICD-10-CM | POA: Diagnosis not present

## 2016-05-07 DIAGNOSIS — Z6824 Body mass index (BMI) 24.0-24.9, adult: Secondary | ICD-10-CM | POA: Diagnosis not present

## 2016-05-07 DIAGNOSIS — E041 Nontoxic single thyroid nodule: Secondary | ICD-10-CM | POA: Diagnosis not present

## 2016-05-07 DIAGNOSIS — M85852 Other specified disorders of bone density and structure, left thigh: Secondary | ICD-10-CM | POA: Diagnosis not present

## 2016-05-07 DIAGNOSIS — H6123 Impacted cerumen, bilateral: Secondary | ICD-10-CM | POA: Diagnosis not present

## 2016-05-07 DIAGNOSIS — Z23 Encounter for immunization: Secondary | ICD-10-CM | POA: Diagnosis not present

## 2016-05-08 DIAGNOSIS — Z1212 Encounter for screening for malignant neoplasm of rectum: Secondary | ICD-10-CM | POA: Diagnosis not present

## 2016-05-19 ENCOUNTER — Other Ambulatory Visit: Payer: Self-pay | Admitting: Internal Medicine

## 2016-05-19 ENCOUNTER — Telehealth: Payer: Self-pay

## 2016-05-19 DIAGNOSIS — Z1231 Encounter for screening mammogram for malignant neoplasm of breast: Secondary | ICD-10-CM

## 2016-05-19 NOTE — Telephone Encounter (Signed)
Called patient at [phone] to discuss BMD results. Left message on voicemail to call me back. Per Dr. Quincy Simmonds, report from [facility] reveals osteopenia of of right femur only, continue with calcium/vitamin D and weight bearing exercise. Repeat study in 2 years.

## 2016-05-19 NOTE — Telephone Encounter (Signed)
Spoke with patient and notified of BMD results--Now has Osteopenia of right femur only per Dr. Quincy Simmonds. Continue with calcium/Vit D and weight bearing exercise. Repeat study in 2 years. Report will be scanned into Epic.

## 2016-05-22 ENCOUNTER — Encounter: Payer: Self-pay | Admitting: Obstetrics and Gynecology

## 2016-05-26 ENCOUNTER — Ambulatory Visit
Admission: RE | Admit: 2016-05-26 | Discharge: 2016-05-26 | Disposition: A | Discharge status: Home / Self Care | Payer: Medicare Other | Source: Ambulatory Visit | Attending: Internal Medicine | Admitting: Internal Medicine

## 2016-05-26 DIAGNOSIS — Z1231 Encounter for screening mammogram for malignant neoplasm of breast: Secondary | ICD-10-CM | POA: Diagnosis not present

## 2016-06-05 DIAGNOSIS — L821 Other seborrheic keratosis: Secondary | ICD-10-CM | POA: Diagnosis not present

## 2016-06-05 DIAGNOSIS — D170 Benign lipomatous neoplasm of skin and subcutaneous tissue of head, face and neck: Secondary | ICD-10-CM | POA: Diagnosis not present

## 2016-06-05 DIAGNOSIS — D225 Melanocytic nevi of trunk: Secondary | ICD-10-CM | POA: Diagnosis not present

## 2016-06-05 DIAGNOSIS — Z23 Encounter for immunization: Secondary | ICD-10-CM | POA: Diagnosis not present

## 2016-06-05 DIAGNOSIS — Z86018 Personal history of other benign neoplasm: Secondary | ICD-10-CM | POA: Diagnosis not present

## 2016-06-05 DIAGNOSIS — D224 Melanocytic nevi of scalp and neck: Secondary | ICD-10-CM | POA: Diagnosis not present

## 2016-06-11 ENCOUNTER — Other Ambulatory Visit: Payer: Self-pay | Admitting: Surgical Oncology

## 2016-06-11 DIAGNOSIS — E042 Nontoxic multinodular goiter: Secondary | ICD-10-CM

## 2016-06-18 DIAGNOSIS — R946 Abnormal results of thyroid function studies: Secondary | ICD-10-CM | POA: Diagnosis not present

## 2016-06-18 DIAGNOSIS — E041 Nontoxic single thyroid nodule: Secondary | ICD-10-CM | POA: Diagnosis not present

## 2016-06-23 ENCOUNTER — Ambulatory Visit
Admission: RE | Admit: 2016-06-23 | Discharge: 2016-06-23 | Disposition: A | Discharge status: Home / Self Care | Payer: Medicare Other | Source: Ambulatory Visit | Attending: Surgical Oncology | Admitting: Surgical Oncology

## 2016-06-23 DIAGNOSIS — E042 Nontoxic multinodular goiter: Secondary | ICD-10-CM

## 2016-06-30 DIAGNOSIS — E041 Nontoxic single thyroid nodule: Secondary | ICD-10-CM | POA: Diagnosis not present

## 2016-08-15 ENCOUNTER — Other Ambulatory Visit: Payer: Self-pay

## 2016-08-15 MED ORDER — ESTRADIOL ACETATE 0.05 MG/24HR VA RING: 1.0000 | VAGINAL_RING | VAGINAL | 3 refills | Status: DC

## 2016-08-15 NOTE — Telephone Encounter (Signed)
Medication refill request: Estradiol Acetate 0.05mg  Last AEX:  02/13/16 BS Next AEX: 02/27/17 BS Last MMG (if hormonal medication request): 05/28/16 BIRADS1, Density C, [facility] Refill authorized: 02/13/16 #1 3R. Please advise. Thank you.

## 2016-08-15 NOTE — Telephone Encounter (Signed)
I am unable to refill this until patient's July appointment.  I am not sure what is happening with the order.

## 2016-08-26 ENCOUNTER — Telehealth: Payer: Self-pay | Admitting: Obstetrics and Gynecology

## 2016-08-26 NOTE — Telephone Encounter (Signed)
Spoke with patient. Patient states she has a new supplement with [facility] and may possibly need a new supplement prescription for progesterone and femring. Patient states she has wanted to stay on these medications for her bones and was using femring due to the pain she was experiencing during intercourse. Patient states the femring is not providing the moisture that she hoped for. Patient states she initially spoke with Dr. Joan Flores about these medications and they were recommended d/t the lesser chance of stroke, so she decided to stay on these medications d/t them being safer. Patient states she now would like to know if there is a less expensive alternative that may be more effective? Patient sates if not, she will stay on them, but would like to know. Patient states she also does other things for her bones like weights. Advised patient Dr. Quincy Simmonds is out of the office today, will review when she returns on 1/10 and call with recommendations, patient is agreeable.  Dr. Quincy Simmonds, please advise?

## 2016-08-26 NOTE — Telephone Encounter (Signed)
Patient would like to speak with nurse about a prescription. 

## 2016-08-26 NOTE — Telephone Encounter (Signed)
I recommend an office visit with me to review her concerns and make appropriate recommendations. I want to be very thoughtful about her care and meet her treatment goals.

## 2016-08-26 NOTE — Telephone Encounter (Signed)
Left message to call Lynnlee Revels at [phone].  

## 2016-08-27 NOTE — Telephone Encounter (Signed)
Spoke with patient, advised as seen below per Dr. Quincy Simmonds. Patient states the femring and prometrium are working well, so she would like to wait and discuss at next AEX 02/27/2017. Patient states she will need a new prescription for Femring and prometrium as she plans to use a new pharmacy. Advised patient she has refills on both medications, she may have the current prescriptions transferred to pharmacy of choice. Advised patient to return call to office for any further questions/concerns. Patient verbalizes understanding and is agreeable.  Routing to provider for final review. Patient is agreeable to disposition. Will close encounter.

## 2016-08-27 NOTE — Telephone Encounter (Signed)
Left message to call Ann Vang at [phone].  

## 2016-08-28 ENCOUNTER — Telehealth: Payer: Self-pay | Admitting: *Deleted

## 2016-08-28 NOTE — Telephone Encounter (Signed)
Options for estrogen are: Vivelle Dot 0.05 mg transdermal patch to skin twice weekly                  OR Estrace 0.5 mg oral tablet by mouth daily.  OK for refills of one or the other until her annual exam is due.  Please cancel the order for the Femring if she selects a new option for her estrogen.

## 2016-08-28 NOTE — Telephone Encounter (Signed)
Incoming fax from [facility] requesting alternative therapy for patient. Femring is on back order.   Medication refill request: alternative femring  Last AEX:  02/13/16  Next AEX: 02/27/17  Last MMG (if hormonal medication request): 05/28/16 BIRADS1:neg  Refill authorized: 08/15/16 #1each/3R. Today please advise.

## 2016-08-29 NOTE — Telephone Encounter (Signed)
Left message to call Keyondra Lagrand at [phone]. 

## 2016-08-29 NOTE — Telephone Encounter (Signed)
Spoke with patient, advised as seen below per Dr. Quincy Simmonds. Patient states she transferred prescriptions to new pharmacy and was able to get femring, no alternative option needed at this time. Patient thankful for return call.   Routing to provider for final review. Patient is agreeable to disposition. Will close encounter.

## 2016-09-25 DIAGNOSIS — M1711 Unilateral primary osteoarthritis, right knee: Secondary | ICD-10-CM | POA: Diagnosis not present

## 2016-09-25 DIAGNOSIS — M25561 Pain in right knee: Secondary | ICD-10-CM

## 2016-09-25 DIAGNOSIS — G8929 Other chronic pain: Secondary | ICD-10-CM | POA: Insufficient documentation

## 2016-09-25 DIAGNOSIS — Z9889 Other specified postprocedural states: Secondary | ICD-10-CM | POA: Diagnosis not present

## 2016-10-03 DIAGNOSIS — D3131 Benign neoplasm of right choroid: Secondary | ICD-10-CM | POA: Diagnosis not present

## 2016-10-03 DIAGNOSIS — H35372 Puckering of macula, left eye: Secondary | ICD-10-CM | POA: Diagnosis not present

## 2016-10-03 DIAGNOSIS — H524 Presbyopia: Secondary | ICD-10-CM | POA: Diagnosis not present

## 2016-10-03 DIAGNOSIS — H2513 Age-related nuclear cataract, bilateral: Secondary | ICD-10-CM | POA: Diagnosis not present

## 2016-11-20 DIAGNOSIS — M1711 Unilateral primary osteoarthritis, right knee: Secondary | ICD-10-CM | POA: Diagnosis not present

## 2016-11-20 DIAGNOSIS — G8929 Other chronic pain: Secondary | ICD-10-CM | POA: Diagnosis not present

## 2016-12-11 DIAGNOSIS — M1711 Unilateral primary osteoarthritis, right knee: Secondary | ICD-10-CM | POA: Diagnosis not present

## 2017-02-06 DIAGNOSIS — G8929 Other chronic pain: Secondary | ICD-10-CM | POA: Diagnosis not present

## 2017-02-06 DIAGNOSIS — M1711 Unilateral primary osteoarthritis, right knee: Secondary | ICD-10-CM | POA: Diagnosis not present

## 2017-02-10 ENCOUNTER — Other Ambulatory Visit: Payer: Self-pay | Admitting: Obstetrics and Gynecology

## 2017-02-10 NOTE — Telephone Encounter (Signed)
Medication refill request: femring  Last AEX:  02/13/16 Dr. Quincy Simmonds  Next AEX: 02/27/17 Dr. Quincy Simmonds  Last MMG (if hormonal medication request): 05/26/16 BIRADS1:Neg  Refill authorized: 08/15/16 #1each/3R. To [facility]. Rx was transferred to another pharmacy due to femring being on back order in January.   New request coming from [facility].  Today #1each/0R?  Routed to Dr. Sabra Heck Dr. Quincy Simmonds out of the office today.

## 2017-02-12 DIAGNOSIS — M1711 Unilateral primary osteoarthritis, right knee: Secondary | ICD-10-CM | POA: Diagnosis not present

## 2017-02-27 ENCOUNTER — Encounter: Payer: Self-pay | Admitting: Obstetrics and Gynecology

## 2017-02-27 ENCOUNTER — Other Ambulatory Visit (HOSPITAL_COMMUNITY)
Admission: RE | Admit: 2017-02-27 | Discharge: 2017-02-27 | Disposition: A | Discharge status: Home / Self Care | Payer: PPO | Source: Ambulatory Visit | Attending: Obstetrics and Gynecology | Admitting: Obstetrics and Gynecology

## 2017-02-27 ENCOUNTER — Ambulatory Visit (INDEPENDENT_AMBULATORY_CARE_PROVIDER_SITE_OTHER): Payer: PPO | Admitting: Obstetrics and Gynecology

## 2017-02-27 VITALS — BP 128/70 | HR 80 | Resp 16 | Ht 67.0 in | Wt 160.0 lb

## 2017-02-27 DIAGNOSIS — R222 Localized swelling, mass and lump, trunk: Secondary | ICD-10-CM

## 2017-02-27 DIAGNOSIS — N841 Polyp of cervix uteri: Secondary | ICD-10-CM

## 2017-02-27 DIAGNOSIS — Z124 Encounter for screening for malignant neoplasm of cervix: Secondary | ICD-10-CM | POA: Diagnosis not present

## 2017-02-27 DIAGNOSIS — Z01419 Encounter for gynecological examination (general) (routine) without abnormal findings: Secondary | ICD-10-CM

## 2017-02-27 MED ORDER — PROGESTERONE MICRONIZED 100 MG PO CAPS: 100.0000 mg | ORAL_CAPSULE | Freq: Every day | ORAL | 3 refills | Status: DC

## 2017-02-27 MED ORDER — ESTRADIOL ACETATE 0.05 MG/24HR VA RING: VAGINAL_RING | VAGINAL | 3 refills | Status: DC

## 2017-02-27 NOTE — Patient Instructions (Signed)

## 2017-02-27 NOTE — Progress Notes (Signed)
71 y.o. G0P0 Married Caucasian female here for annual exam.    On Femring and Prometrium.  No bleeding.  Wants to continue.   Intercourse is painful.  May be needing a knee replacement.   No health changes.   PCP:  Dr. Ardeth Perfect  Patient's last menstrual period was 08/18/1994 (approximate).           Sexually active: Yes.    The current method of family planning is post menopausal status.    Exercising: Yes.    weights 3x a week, pilates, cardio Smoker:  no  Health Maintenance: Pap:  02/05/15 WNL History of abnormal Pap:  Yes.  Hx of LEEP in 1996. MMG:  05/26/16 BIRADS 1 negative/density c Colonoscopy:  01/11/14 Mild Diverticulitis-repeat 10 yrs BMD:   05/07/16  Result  Osteopenia .  Hx Fosamax use.  TDaP:  03/16/2008 HIV: none Hep C: pt is a blood donor Screening Labs:  PCP takes care of labs   reports that she quit smoking about 44 years ago. She has never used smokeless tobacco. She reports that she drinks about 8.4 oz of alcohol per week . She reports that she does not use drugs.  Past Medical History:  Diagnosis Date  . Abnormal breast finding 12/1998   left breast abnl  . Arthritis    --basal joint--right hand  . Carpal tunnel syndrome, right   . Diverticulosis 2003   mild  . Lumbar disc disorder 2000   bulging  . Menopause   . Osteoporosis    Fx L4 age 31 water skiing accident  . Thyroid disease 10/2014   thyroid nodules--bx'd large nodule and was benign--sees Gen. surgeon with [facility]    Past Surgical History:  Procedure Laterality Date  . AUGMENTATION MAMMAPLASTY  1983  . BREAST SURGERY  2010   implants removed ruptured  . CERVICAL BIOPSY  W/ LOOP ELECTRODE EXCISION  1996   CIN  . KNEE SURGERY  2012   meniscus rt knee (both)  . L-2 fracture  2009   fall in aerobics  . L-4 fracture   water skiing    . TENOSYNOVECTOMY Right 03/11/2016   Procedure: TENOSYNOVECTOMY FLEXOR RIGHT RING FINGER;  Surgeon: Daryll Brod, MD;  Location: [place];  Service: Orthopedics;  Laterality: Right;  . thyroid nodule biopsy  10/2014   --[facility], [place]--  . TONSILLECTOMY AND ADENOIDECTOMY  1964    Current Outpatient Prescriptions  Medication Sig Dispense Refill  . Cholecalciferol (VITAMIN D) 2000 UNITS tablet Take 2,000 Units by mouth daily.    Marland Kitchen FEMRING 0.05 MG/24HR RING INSERT 1 RING VAGINALLY EVERY THREE MONTHS 1 each 0  . fluticasone (FLONASE) 50 MCG/ACT nasal spray Place 2 sprays into the nose daily. 48 g 1  . progesterone (PROMETRIUM) 100 MG capsule Take 1 capsule (100 mg total) by mouth daily. 90 capsule 3   Current Facility-Administered Medications  Medication Dose Route Frequency Provider Last Rate Last Dose  . doxycycline (VIBRA-TABS) tablet 100 mg  100 mg Oral Q12H Daryll Brod, MD        Family History  Problem Relation Age of Onset  . Osteoporosis Mother   . Alzheimer's disease Mother   . Hypertension Father   . Heart attack Father   . Osteoporosis Maternal Grandmother   . Colon cancer Maternal Grandmother   . Colon cancer Other     ROS:  Pertinent items are noted in HPI.  Otherwise, a comprehensive ROS was negative.  Exam:  BP 128/70 (BP Location: Right Arm, Patient Position: Sitting, Cuff Size: Normal)   Pulse 80   Resp 16   Ht 5\' 7"  (1.702 m)   Wt 160 lb (72.6 kg)   LMP 08/18/1994 (Approximate)   BMI 25.06 kg/m     General appearance: alert, cooperative and appears stated age Head: Normocephalic, without obvious abnormality, atraumatic Neck: no adenopathy, supple, symmetrical, trachea midline and thyroid normal to inspection and palpation Lungs: clear to auscultation bilaterally Breasts: normal appearance, no masses or tenderness, No nipple retraction or dimpling, No nipple discharge or bleeding, No axillary or supraclavicular adenopathy.  5 cm left chest wall mass. (previously had MRI of the breast.) Heart: regular rate and rhythm Abdomen: soft, non-tender; no masses, no  organomegaly Extremities: extremities normal, atraumatic, no cyanosis or edema Skin: Skin color, texture, turgor normal. No rashes or lesions Lymph nodes: Cervical, supraclavicular, and axillary nodes normal. No abnormal inguinal nodes palpated Neurologic: Grossly normal  Pelvic: External genitalia:  no lesions              Urethra:  normal appearing urethra with no masses, tenderness or lesions              Bartholins and Skenes: normal                 Vagina: normal appearing vagina with normal color and discharge, no lesions              Cervix: no lesions              Pap taken: Yes.  Cervical polyp removed after verbal consent.  Sent to path. Bimanual Exam:  Uterus:  normal size, contour, position, consistency, mobility, non-tender              Adnexa: no mass, fullness, tenderness              Rectal exam: Yes.  .  Confirms.              Anus:  normal sphincter tone, no lesions  Chaperone was present for exam.  Assessment:   Well woman visit with normal exam. Chest wall mass. Hx LEEP.  Osteopenia.  HRT.  Cervical polyp.  Plan: Mammogram screening discussed. Recommended self breast awareness. Pap and HR HPV as above. Guidelines for Calcium, Vitamin D, regular exercise program including cardiovascular and weight bearing exercise. Discussed WHI and risks of Stroke, breast cancer, MI, DVT and PE.   She will continue with HRT.  Cervical polyp to path.  Will pursue CT or MRI of chest wall.  BMD in 2019. Follow up annually and prn.   After visit summary provided.

## 2017-03-02 ENCOUNTER — Telehealth: Payer: Self-pay

## 2017-03-02 DIAGNOSIS — R222 Localized swelling, mass and lump, trunk: Secondary | ICD-10-CM

## 2017-03-02 NOTE — Telephone Encounter (Signed)
Spoke with patient. Advised of appointment date and time for CT chest w contrast at [place] on 03/16/2017 at 10:30 am . Patient is agreeable to date and time. Placed in imaging hold.  Routing to provider for final review. Patient agreeable to disposition. Will close encounter.

## 2017-03-02 NOTE — Telephone Encounter (Signed)
Patient returning your call.

## 2017-03-02 NOTE — Telephone Encounter (Signed)
Spoke with patient. Advised of message as seen below from [place]. Patient is agreeable and would like to proceed with scheduling.  Call to [place]. Spoke with [place]. Ct chest w contrast scheduled for 03/16/2017 at 10:30 am at [place]. Patient will need to arrive at 10:10 am.  Left message patient to call Kaitlyn at [phone].

## 2017-03-02 NOTE — Telephone Encounter (Signed)
Spoke with [facility]. CT with contrast is recommended. Order placed.  Left message for patient to call [place] at [phone].

## 2017-03-02 NOTE — Telephone Encounter (Signed)
-----   Message from Nunzio Cobbs, MD sent at 03/01/2017  8:12 PM EDT ----- Regarding: patient needs imaging of the left chest wall Patient has a left chest wall mass for which she needs to have imaging.  We suspect this is a lipoma.  Please contact [facility] to determine if this needs to be a CT or an MRI of the area.  She had an MRI of the breast years ago, and this determined that she had normal breast tissue.  The mass of the chest wall was not mentioned in this imaging.   Thank you so much!  Brook

## 2017-03-03 LAB — CYTOLOGY - PAP: Diagnosis: NEGATIVE

## 2017-03-10 ENCOUNTER — Other Ambulatory Visit: Payer: Self-pay | Admitting: Obstetrics and Gynecology

## 2017-03-11 ENCOUNTER — Other Ambulatory Visit: Payer: Self-pay | Admitting: Obstetrics and Gynecology

## 2017-03-16 ENCOUNTER — Telehealth: Payer: Self-pay | Admitting: *Deleted

## 2017-03-16 ENCOUNTER — Ambulatory Visit
Admission: RE | Admit: 2017-03-16 | Discharge: 2017-03-16 | Disposition: A | Discharge status: Home / Self Care | Payer: PPO | Source: Ambulatory Visit | Attending: Obstetrics and Gynecology | Admitting: Obstetrics and Gynecology

## 2017-03-16 DIAGNOSIS — R918 Other nonspecific abnormal finding of lung field: Secondary | ICD-10-CM | POA: Diagnosis not present

## 2017-03-16 DIAGNOSIS — R222 Localized swelling, mass and lump, trunk: Secondary | ICD-10-CM

## 2017-03-16 MED ORDER — IOPAMIDOL (ISOVUE-300) INJECTION 61%
75.0000 mL | Freq: Once | INTRAVENOUS | Status: AC | PRN
  Administered 2017-03-16: 75 mL via INTRAVENOUS

## 2017-03-16 NOTE — Telephone Encounter (Signed)
Ann Vang from [place] calling report on CT chest. Report available in EPIC and reviewed with Ann Vang. Copy to Dr. Quincy Simmonds for review.   Routing to provider for review.

## 2017-03-17 NOTE — Telephone Encounter (Signed)
Phone call to patient from my personal cell phone. I left a message that I was calling about results and that I am out of the office today.  No details given. I asked her to call me back on my cell phone.  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 She has a hemangioma of the liver. Also seen was a left renal mass 6.5 cm that requires evaluation by a urologist.  In addition, she has bilateral lung nodules that will need follow up care.  I will refer her to a urologist of choice:  Dr. Nevada Crane in [facility], Dr. Rosana Hoes at [facility], or urologist of choice.   A copy of the CT will need to go to her PCP, Dr. Ardeth Perfect, along with an appointment for her to see him so he can follow her lung care as appropriate.

## 2017-03-17 NOTE — Telephone Encounter (Signed)
Incoming phone call from patient to my personal cell phone. I discussed her CT results in detail and will release the CT report to her.  I will refer patient to Urologist with cancer training.  Patient states she prefers to see someone in network if possible.  She has [employer] and uses the [facility] for her care overall.  She knows that some providers outside of [facility] accept her plan. Her overall concern, however, is to see the best person qualified for her case.   A copy of her CT will also need to go to Dr. Ardeth Perfect so he can see her for an appointment and assist in the follow up of the lung nodules.

## 2017-03-17 NOTE — Telephone Encounter (Signed)
Called patient at 1530. Advised of appointment with Dr Aletha Halim on Friday 03-20-17 at 1015. Address and phone number given.  Contacted PCP Dr Hoover Brunette office and spoke with [place]. Requested records be sent ( faxed) for review along with currently scheduled appointment information and they will contact patient with next step from their plan. Patient notified of this and instructed to call me back if she does not hear from them by noon tomorrow. Patient agreeable.  Last annual exam note, pap, cervical biopsy result and CT report faxed to Dr Estill Dooms and Dr Hoover Brunette office.  Routing to provider for final review. Patient agreeable to disposition. Will close encounter.

## 2017-03-17 NOTE — Telephone Encounter (Signed)
Incoming phone call from patient on my call phone.  She left a message as I was on another call. Patient called her insurance company, and has learned that Dr. Nevada Crane is in network and that Dr. Lawerance Bach is not in network.   She would like to pursue a consultation with Dr. Nevada Crane.

## 2017-03-20 DIAGNOSIS — N2889 Other specified disorders of kidney and ureter: Secondary | ICD-10-CM | POA: Diagnosis not present

## 2017-03-30 DIAGNOSIS — R918 Other nonspecific abnormal finding of lung field: Secondary | ICD-10-CM | POA: Diagnosis not present

## 2017-03-30 DIAGNOSIS — N949 Unspecified condition associated with female genital organs and menstrual cycle: Secondary | ICD-10-CM | POA: Diagnosis not present

## 2017-03-30 DIAGNOSIS — N2889 Other specified disorders of kidney and ureter: Secondary | ICD-10-CM | POA: Diagnosis not present

## 2017-03-30 DIAGNOSIS — D3002 Benign neoplasm of left kidney: Secondary | ICD-10-CM | POA: Diagnosis not present

## 2017-04-06 ENCOUNTER — Other Ambulatory Visit: Payer: Self-pay | Admitting: Urology

## 2017-04-06 DIAGNOSIS — N2889 Other specified disorders of kidney and ureter: Secondary | ICD-10-CM

## 2017-04-09 ENCOUNTER — Telehealth: Payer: Self-pay | Admitting: *Deleted

## 2017-04-09 ENCOUNTER — Telehealth: Payer: Self-pay | Admitting: Obstetrics and Gynecology

## 2017-04-09 ENCOUNTER — Other Ambulatory Visit: Payer: Self-pay | Admitting: Urology

## 2017-04-09 DIAGNOSIS — N2889 Other specified disorders of kidney and ureter: Secondary | ICD-10-CM

## 2017-04-09 DIAGNOSIS — N9489 Other specified conditions associated with female genital organs and menstrual cycle: Secondary | ICD-10-CM

## 2017-04-09 DIAGNOSIS — D1771 Benign lipomatous neoplasm of kidney: Secondary | ICD-10-CM

## 2017-04-09 NOTE — Telephone Encounter (Signed)
Left message to call Avamarie Crossley at [phone].  

## 2017-04-09 NOTE — Telephone Encounter (Signed)
Call placed to patient to review benefits for a scheduled ultrasound.  Left voicemail message requesting a return call.

## 2017-04-09 NOTE — Telephone Encounter (Signed)
Spoke with [name] at [facility], will fax copy of CT report dated 03/30/17 to [phone].

## 2017-04-09 NOTE — Telephone Encounter (Signed)
Left message for [name] in medical records to return call to [place] at [facility], [phone].   Request copy of CT report dated 03/30/17.

## 2017-04-09 NOTE — Telephone Encounter (Signed)
Reviewed schedule with clinical supervisor.  Spoke with patient, PUS scheduled for 04/16/17 at 1:30pm with consult to follow at 2pm with Dr. Quincy Simmonds. Patient verbalizes understanding and is agreeable to date and time.  Order placed for PUS.  CT report placed on Dr. Elza Rafter desk for review.  Routing to provider for final review. Patient is agreeable to disposition. Will close encounter.

## 2017-04-09 NOTE — Telephone Encounter (Signed)
Spoke with patient, advised of recommendations per Dr. Quincy Simmonds for f/u with PUS. Patient declined appointment for 8/28. Advised patient would review scheduling with Dr. Quincy Simmonds and return call. Patient states she is available 8/27, 8/29, 8/31. Advised will return call with appointment details. Patient is agreeable.

## 2017-04-15 ENCOUNTER — Other Ambulatory Visit: Payer: Self-pay | Admitting: Internal Medicine

## 2017-04-15 ENCOUNTER — Ambulatory Visit
Admission: RE | Admit: 2017-04-15 | Discharge: 2017-04-15 | Disposition: A | Discharge status: Home / Self Care | Payer: PPO | Source: Ambulatory Visit | Attending: Urology | Admitting: Urology

## 2017-04-15 ENCOUNTER — Other Ambulatory Visit: Payer: Self-pay | Admitting: Interventional Radiology

## 2017-04-15 DIAGNOSIS — D1771 Benign lipomatous neoplasm of kidney: Secondary | ICD-10-CM | POA: Diagnosis not present

## 2017-04-15 DIAGNOSIS — Z1231 Encounter for screening mammogram for malignant neoplasm of breast: Secondary | ICD-10-CM

## 2017-04-15 DIAGNOSIS — N2889 Other specified disorders of kidney and ureter: Secondary | ICD-10-CM

## 2017-04-15 HISTORY — PX: IR RADIOLOGIST EVAL & MGMT: IMG5224

## 2017-04-15 NOTE — Consult Note (Signed)
Chief Complaint: Patient was seen in consultation today for left renal mass at the request of Eskew,Lawrence A  Referring Physician(s): Eskew,Lawrence A  History of Present Illness: Ann Vang is a 71 y.o. female who had a CT scan of the chest in late July of this year to evaluate a palpable mass along her left chest wall which turned out to be a lipoma. However, an incidentally detected mass was noted arising from the left kidney. She subsequently underwent a dedicated renal protocol CT scan of the abdomen and pelvis on 03/30/2017 which identified a 7.2 cm enhancing soft tissue mass arising exophytic from the upper pole of the left kidney. The mass contains numerous areas of microscopic fat most consistent with an angiomyolipoma.  She was referred to Dr. Estill Dooms for surgical consultation.  He then kindly referred her to interventional radiology to discuss transarterial embolization as a surgical resection of the mass would be a very involved surgery given its size.  Ann Vang is in excellent health and appears much younger than her stated age. She has essentially clinically asymptomatic. On the review of systems she complains of intermittent midline lumbar spine pain in the morning which resolves spontaneously. She denies hematuria, flank pain, chest pain, shortness of breath or other systemic symptoms.   Past Medical History:  Diagnosis Date  . Abnormal breast finding 12/1998   left breast abnl  . Arthritis    --basal joint--right hand  . Carpal tunnel syndrome, right   . Diverticulosis 2003   mild  . Lumbar disc disorder 2000   bulging  . Menopause   . Osteoporosis    Fx L4 age 11 water skiing accident  . Thyroid disease 10/2014   thyroid nodules--bx'd large nodule and was benign--sees Gen. surgeon with [facility]    Past Surgical History:  Procedure Laterality Date  . AUGMENTATION MAMMAPLASTY  1983  . BREAST SURGERY  2010   implants removed ruptured  .  CERVICAL BIOPSY  W/ LOOP ELECTRODE EXCISION  1996   CIN  . KNEE SURGERY  2012   meniscus rt knee (both)  . L-2 fracture  2009   fall in aerobics  . L-4 fracture   water skiing    . TENOSYNOVECTOMY Right 03/11/2016   Procedure: TENOSYNOVECTOMY FLEXOR RIGHT RING FINGER;  Surgeon: Daryll Brod, MD;  Location: [place];  Service: Orthopedics;  Laterality: Right;  . thyroid nodule biopsy  10/2014   --[facility], [place]--  . TONSILLECTOMY AND ADENOIDECTOMY  1964    Allergies: Patient has no known allergies.  Medications: Prior to Admission medications   Medication Sig Start Date End Date Taking? Authorizing Provider  Cholecalciferol (VITAMIN D) 2000 UNITS tablet Take 2,000 Units by mouth daily.   Yes [provider]  Estradiol Acetate ([place]) 0.05 MG/24HR RING INSERT 1 RING VAGINALLY EVERY THREE MONTHS 02/27/17  Yes Nunzio Cobbs, MD  fluticasone [facility]) 50 MCG/ACT nasal spray Place 2 sprays into the nose daily. 07/13/12  Yes HodginMora Appl, MD  progesterone (PROMETRIUM) 100 MG capsule Take 1 capsule (100 mg total) by mouth daily. 02/27/17  Yes Nunzio Cobbs, MD     Family History  Problem Relation Age of Onset  . Osteoporosis Mother   . Alzheimer's disease Mother   . Hypertension Father   . Heart attack Father   . Osteoporosis Maternal Grandmother   . Colon cancer Maternal Grandmother   . Colon cancer Other  Social History   Social History  . Marital status: Married    Spouse name: N/A  . Number of children: N/A  . Years of education: N/A   Social History Main Topics  . Smoking status: Former Smoker    Quit date: 12/21/1972  . Smokeless tobacco: Never Used  . Alcohol use 8.4 oz/week    14 Standard drinks or equivalent per week     Comment: 2 glasses of wine at night  . Drug use: No  . Sexual activity: Yes    Partners: Male    Birth control/ protection: Post-menopausal   Other Topics Concern  . Not on file    Social History Narrative  . No narrative on file     Review of Systems: A 12 point ROS discussed and pertinent positives are indicated in the HPI above.  All other systems are negative.  Review of Systems  Vital Signs: BP 131/72 (BP Location: Left Arm, Cuff Size: Normal)   Pulse 73   Temp 98.5 F (36.9 C)   Resp 14   Ht 5\' 7"  (1.702 m)   Wt 158 lb (71.7 kg)   LMP 08/18/1994 (Approximate)   SpO2 98%   BMI 24.75 kg/m   Physical Exam  Constitutional: She is oriented to person, place, and time. She appears well-developed and well-nourished. No distress.  HENT:  Head: Normocephalic and atraumatic.  Eyes: No scleral icterus.  Cardiovascular: Normal rate and regular rhythm.   Pulmonary/Chest: Effort normal.  Abdominal: Soft. She exhibits no distension. There is no tenderness.  Neurological: She is alert and oriented to person, place, and time.  Skin: Skin is warm and dry.  Psychiatric: She has a normal mood and affect. Her behavior is normal.  Nursing note and vitals reviewed.    Imaging: No results found.  Labs:  CBC: No results for input(s): WBC, HGB, HCT, PLT in the last 8760 hours.  COAGS: No results for input(s): INR, APTT in the last 8760 hours.  BMP: No results for input(s): NA, K, CL, CO2, GLUCOSE, BUN, CALCIUM, CREATININE, GFRNONAA, GFRAA in the last 8760 hours.  Invalid input(s): CMP  LIVER FUNCTION TESTS: No results for input(s): BILITOT, AST, ALT, ALKPHOS, PROT, ALBUMIN in the last 8760 hours.  TUMOR MARKERS: No results for input(s): AFPTM, CEA, CA199, CHROMGRNA in the last 8760 hours.  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  I explained the risk of spontaneous hemorrhage in renal angiomyolipoma is larger than 4 cm as well as the treatment options including surgical resection and transcatheter embolization. She and her husband asked excellent questions which I answered to  the best of my ability. They understand that the risks include pain, post embolization syndrome, nontarget embolization and decreased renal function.  They understand and she is willing to proceed with embolization. I think she is an excellent candidate for this procedure.  1.) Schedule for left renal arteriogram and embolization. Patient prefers to be treated at [facility].  Thank you for this interesting consult.  I greatly enjoyed meeting Ann Vang and look forward to participating in their care.  A copy of this report was sent to the requesting provider on this date.  Electronically Signed: Jacqulynn Cadet 04/15/2017, 3:34 PM   I spent a total of  40 Minutes in face to face in clinical consultation, greater than 50% of which was counseling/coordinating care for left renal AML.

## 2017-04-16 ENCOUNTER — Ambulatory Visit (INDEPENDENT_AMBULATORY_CARE_PROVIDER_SITE_OTHER): Payer: PPO

## 2017-04-16 ENCOUNTER — Ambulatory Visit (INDEPENDENT_AMBULATORY_CARE_PROVIDER_SITE_OTHER): Payer: PPO | Admitting: Obstetrics and Gynecology

## 2017-04-16 ENCOUNTER — Encounter: Payer: Self-pay | Admitting: Obstetrics and Gynecology

## 2017-04-16 VITALS — BP 144/70 | HR 68 | Ht 67.0 in | Wt 160.0 lb

## 2017-04-16 DIAGNOSIS — N949 Unspecified condition associated with female genital organs and menstrual cycle: Secondary | ICD-10-CM

## 2017-04-16 DIAGNOSIS — R938 Abnormal findings on diagnostic imaging of other specified body structures: Secondary | ICD-10-CM

## 2017-04-16 DIAGNOSIS — D259 Leiomyoma of uterus, unspecified: Secondary | ICD-10-CM | POA: Diagnosis not present

## 2017-04-16 DIAGNOSIS — R9389 Abnormal findings on diagnostic imaging of other specified body structures: Secondary | ICD-10-CM

## 2017-04-16 DIAGNOSIS — N9489 Other specified conditions associated with female genital organs and menstrual cycle: Secondary | ICD-10-CM

## 2017-04-16 NOTE — Progress Notes (Signed)
GYNECOLOGY  VISIT   HPI: 71 y.o.   Married  Caucasian  female   G0P0 with Patient's last menstrual period was 08/18/1994 (approximate).   here for pelvic ultrasound for abnormal CT scan indicating calcified mass near right adnexa versus in uterus - 2.5 cm.   Has a left renal lipoma and will have an embolization procedure within the next month. The CT scan to define this further is what found the potential pelvic mass.  States that her Femring is not helpful. Is having pain with intercourse.  No vaginal bleeding.   GYNECOLOGIC HISTORY: Patient's last menstrual period was 08/18/1994 (approximate). Contraception:  Postmenopausal Menopausal hormone therapy:  Femring and Prometrium Last mammogram: 05/26/16 BIRADS 1 negative/density c Last pap smear: 02-27-17 Neg, 02-05-15 Neg        OB History    Gravida Para Term Preterm AB Living   0             SAB TAB Ectopic Multiple Live Births                     Patient Active Problem List   Diagnosis Date Noted  . Chronic pain of right knee 09/25/2016  . Flexor tenosynovitis of finger 03/14/2016  . Pain 02/20/2016  . Thyroid nodule 05/15/2015  . Lipoma of back 10/06/2014  . Lumbar compression fracture ([place]) 07/13/2012  . Ear pressure 07/13/2012  . DYSFUNCTION OF EUSTACHIAN TUBE 07/30/2010  . OSTEOPENIA 03/15/2010    Past Medical History:  Diagnosis Date  . Abnormal breast finding 12/1998   left breast abnl  . Arthritis    --basal joint--right hand  . Carpal tunnel syndrome, right   . Diverticulosis 2003   mild  . Lumbar disc disorder 2000   bulging  . Menopause   . Osteoporosis    Fx L4 age 35 water skiing accident  . Thyroid disease 10/2014   thyroid nodules--bx'd large nodule and was benign--sees Gen. surgeon with [facility]    Past Surgical History:  Procedure Laterality Date  . AUGMENTATION MAMMAPLASTY  1983  . BREAST SURGERY  2010   implants removed ruptured  . CERVICAL BIOPSY  W/ LOOP ELECTRODE EXCISION  1996    CIN  . KNEE SURGERY  2012   meniscus rt knee (both)  . L-2 fracture  2009   fall in aerobics  . L-4 fracture   water skiing    . TENOSYNOVECTOMY Right 03/11/2016   Procedure: TENOSYNOVECTOMY FLEXOR RIGHT RING FINGER;  Surgeon: Daryll Brod, MD;  Location: [place];  Service: Orthopedics;  Laterality: Right;  . thyroid nodule biopsy  10/2014   --[facility], [place]--  . TONSILLECTOMY AND ADENOIDECTOMY  1964    Current Outpatient Prescriptions  Medication Sig Dispense Refill  . Cholecalciferol (VITAMIN D) 2000 UNITS tablet Take 2,000 Units by mouth daily.    . Estradiol Acetate (FEMRING) 0.05 MG/24HR RING INSERT 1 RING VAGINALLY EVERY THREE MONTHS 1 each 3  . fluticasone (FLONASE) 50 MCG/ACT nasal spray Place 2 sprays into the nose daily. 48 g 1  . progesterone (PROMETRIUM) 100 MG capsule Take 1 capsule (100 mg total) by mouth daily. 90 capsule 3   Current Facility-Administered Medications  Medication Dose Route Frequency Provider Last Rate Last Dose  . doxycycline (VIBRA-TABS) tablet 100 mg  100 mg Oral Q12H Daryll Brod, MD         ALLERGIES: Patient has no known allergies.  Family History  Problem Relation Age of Onset  .  Osteoporosis Mother   . Alzheimer's disease Mother   . Hypertension Father   . Heart attack Father   . Osteoporosis Maternal Grandmother   . Colon cancer Maternal Grandmother   . Colon cancer Other     Social History   Social History  . Marital status: Married    Spouse name: N/A  . Number of children: N/A  . Years of education: N/A   Occupational History  . Not on file.   Social History Main Topics  . Smoking status: Former Smoker    Quit date: 12/21/1972  . Smokeless tobacco: Never Used  . Alcohol use 8.4 oz/week    14 Standard drinks or equivalent per week     Comment: 2 glasses of wine at night  . Drug use: No  . Sexual activity: Yes    Partners: Male    Birth control/ protection: Post-menopausal   Other Topics Concern   . Not on file   Social History Narrative  . No narrative on file    ROS:  Pertinent items are noted in HPI.  PHYSICAL EXAMINATION:    BP (!) 144/70 (BP Location: Right Arm, Patient Position: Sitting, Cuff Size: Normal)   Pulse 68   Ht 5\' 7"  (1.702 m)   Wt 160 lb (72.6 kg)   LMP 08/18/1994 (Approximate)   BMI 25.06 kg/m     General appearance: alert, cooperative and appears stated age   Pelvic US  2 Fibroids, each about 2.5 cm. EMS 7.05 mm. Normal bilateral ovaries.  No free fluid.   ASSESSMENT  Uterine fibroids.  Thickened endometrium. No adnexal masses.  HRT.  Vaginal atrophy. Dyspareunia.  PLAN  Discussion of fibroids and thickened endometrium.  Return for sonohysterogram.  OK to continue HRT for now.   An After Visit Summary was printed and given to the patient.  __15____ minutes face to face time of which over 50% was spent in counseling.

## 2017-04-16 NOTE — Progress Notes (Signed)
Encounter reviewed by Dr. Brook Amundson C. Silva.  

## 2017-05-05 ENCOUNTER — Ambulatory Visit (INDEPENDENT_AMBULATORY_CARE_PROVIDER_SITE_OTHER): Payer: PPO

## 2017-05-05 ENCOUNTER — Ambulatory Visit (INDEPENDENT_AMBULATORY_CARE_PROVIDER_SITE_OTHER): Payer: PPO | Admitting: Obstetrics and Gynecology

## 2017-05-05 ENCOUNTER — Encounter: Payer: Self-pay | Admitting: Obstetrics and Gynecology

## 2017-05-05 VITALS — BP 120/72 | HR 76 | Resp 16 | Wt 163.6 lb

## 2017-05-05 DIAGNOSIS — R9389 Abnormal findings on diagnostic imaging of other specified body structures: Secondary | ICD-10-CM

## 2017-05-05 DIAGNOSIS — D259 Leiomyoma of uterus, unspecified: Secondary | ICD-10-CM | POA: Diagnosis not present

## 2017-05-05 DIAGNOSIS — R938 Abnormal findings on diagnostic imaging of other specified body structures: Secondary | ICD-10-CM | POA: Diagnosis not present

## 2017-05-05 DIAGNOSIS — N858 Other specified noninflammatory disorders of uterus: Secondary | ICD-10-CM | POA: Diagnosis not present

## 2017-05-05 NOTE — Progress Notes (Signed)
GYNECOLOGY  VISIT   HPI: 71 y.o.   Married  Caucasian  female   G0P0 with Patient's last menstrual period was 08/18/1994 (approximate).   here for Sonohysterogram  Thickened endometrium.     Uses Femring and Prometrium.  No vaginal bleeding.   Having renal surgery on 05/13/17.   GYNECOLOGIC HISTORY: Patient's last menstrual period was 08/18/1994 (approximate). Contraception:  Postmenopausal Menopausal hormone therapy:  Femring and Prometrium Last mammogram:  05/26/16 BIRADS 1 negative/density c Last pap smear:   02-27-17 Neg, 02-05-15 Neg        OB History    Gravida Para Term Preterm AB Living   0             SAB TAB Ectopic Multiple Live Births                     Patient Active Problem List   Diagnosis Date Noted  . Chronic pain of right knee 09/25/2016  . Flexor tenosynovitis of finger 03/14/2016  . Pain 02/20/2016  . Thyroid nodule 05/15/2015  . Lipoma of back 10/06/2014  . Lumbar compression fracture ([place]) 07/13/2012  . Ear pressure 07/13/2012  . DYSFUNCTION OF EUSTACHIAN TUBE 07/30/2010  . OSTEOPENIA 03/15/2010    Past Medical History:  Diagnosis Date  . Abnormal breast finding 12/1998   left breast abnl  . Arthritis    --basal joint--right hand  . Carpal tunnel syndrome, right   . Diverticulosis 2003   mild  . Lumbar disc disorder 2000   bulging  . Menopause   . Osteoporosis    Fx L4 age 39 water skiing accident  . Thyroid disease 10/2014   thyroid nodules--bx'd large nodule and was benign--sees Gen. surgeon with [facility]    Past Surgical History:  Procedure Laterality Date  . AUGMENTATION MAMMAPLASTY  1983  . BREAST SURGERY  2010   implants removed ruptured  . CERVICAL BIOPSY  W/ LOOP ELECTRODE EXCISION  1996   CIN  . KNEE SURGERY  2012   meniscus rt knee (both)  . L-2 fracture  2009   fall in aerobics  . L-4 fracture   water skiing    . TENOSYNOVECTOMY Right 03/11/2016   Procedure: TENOSYNOVECTOMY FLEXOR RIGHT RING FINGER;  Surgeon: Daryll Brod, MD;  Location: [facility];  Service: Orthopedics;  Laterality: Right;  . thyroid nodule biopsy  10/2014   --[facility], [place]--  . TONSILLECTOMY AND ADENOIDECTOMY  1964    Current Outpatient Prescriptions  Medication Sig Dispense Refill  . Cholecalciferol (VITAMIN D) 2000 UNITS tablet Take 2,000 Units by mouth daily.    . Estradiol Acetate (FEMRING) 0.05 MG/24HR RING INSERT 1 RING VAGINALLY EVERY THREE MONTHS 1 each 3  . fluticasone (FLONASE) 50 MCG/ACT nasal spray Place 2 sprays into the nose daily. 48 g 1  . progesterone (PROMETRIUM) 100 MG capsule Take 1 capsule (100 mg total) by mouth daily. 90 capsule 3   Current Facility-Administered Medications  Medication Dose Route Frequency Provider Last Rate Last Dose  . doxycycline (VIBRA-TABS) tablet 100 mg  100 mg Oral Q12H Daryll Brod, MD         ALLERGIES: Patient has no known allergies.  Family History  Problem Relation Age of Onset  . Osteoporosis Mother   . Alzheimer's disease Mother   . Hypertension Father   . Heart attack Father   . Osteoporosis Maternal Grandmother   . Colon cancer Maternal Grandmother   . Colon cancer Other  Social History   Social History  . Marital status: Married    Spouse name: N/A  . Number of children: N/A  . Years of education: N/A   Occupational History  . Not on file.   Social History Main Topics  . Smoking status: Former Smoker    Quit date: 12/21/1972  . Smokeless tobacco: Never Used  . Alcohol use 8.4 oz/week    14 Standard drinks or equivalent per week     Comment: 2 glasses of wine at night  . Drug use: No  . Sexual activity: Yes    Partners: Male    Birth control/ protection: Post-menopausal   Other Topics Concern  . Not on file   Social History Narrative  . No narrative on file    ROS:  Pertinent items are noted in HPI.  PHYSICAL EXAMINATION:    BP 120/72 (BP Location: Left Arm, Patient Position: Sitting, Cuff Size: Normal)   Pulse 76    Resp 16   Wt 163 lb 9.6 oz (74.2 kg)   LMP 08/18/1994 (Approximate)   BMI 25.62 kg/m     General appearance: alert, cooperative and appears stated age  Pelvic US today: Uterus with unchanged fibroids.  EMS 6.47.  Normal ovaries.  No free fluid.   Sonohysterogram and EBM. Consent for procedures.  Sterile prep with betadine.  Saline injected and irregular border on right uterine wall noted.  No feeder vessel appreciated.   Small pipelle passed twice.  Tissue to pathology.  No complications.  Minimal EBL.   ASSESSMENT  Thickened endometrium. Uterine fibroids.    PLAN  Follow up EMB. Instructions/precautions given.   Discussion of possible outcome of bx results - no change in care, change in HRT regimen, hysteroscopy with dilation and curettage.   An After Visit Summary was printed and given to the patient.  __15____ minutes face to face time of which over 50% was spent in counseling.

## 2017-05-05 NOTE — Patient Instructions (Signed)

## 2017-05-12 ENCOUNTER — Other Ambulatory Visit: Payer: Self-pay | Admitting: Radiology

## 2017-05-13 ENCOUNTER — Observation Stay (HOSPITAL_COMMUNITY)
Admission: RE | Admit: 2017-05-13 | Discharge: 2017-05-14 | Disposition: A | Discharge status: Home / Self Care | Payer: PPO | Source: Ambulatory Visit | Attending: Interventional Radiology | Admitting: Interventional Radiology

## 2017-05-13 ENCOUNTER — Ambulatory Visit (HOSPITAL_COMMUNITY)
Admission: RE | Admit: 2017-05-13 | Discharge: 2017-05-13 | Disposition: A | Discharge status: Home / Self Care | Payer: PPO | Source: Ambulatory Visit | Attending: Interventional Radiology | Admitting: Interventional Radiology

## 2017-05-13 ENCOUNTER — Encounter (HOSPITAL_COMMUNITY): Payer: Self-pay

## 2017-05-13 DIAGNOSIS — Z79899 Other long term (current) drug therapy: Secondary | ICD-10-CM | POA: Insufficient documentation

## 2017-05-13 DIAGNOSIS — M19041 Primary osteoarthritis, right hand: Secondary | ICD-10-CM | POA: Insufficient documentation

## 2017-05-13 DIAGNOSIS — D3002 Benign neoplasm of left kidney: Secondary | ICD-10-CM | POA: Diagnosis not present

## 2017-05-13 DIAGNOSIS — Z79890 Hormone replacement therapy (postmenopausal): Secondary | ICD-10-CM | POA: Insufficient documentation

## 2017-05-13 DIAGNOSIS — M810 Age-related osteoporosis without current pathological fracture: Secondary | ICD-10-CM | POA: Diagnosis not present

## 2017-05-13 DIAGNOSIS — D1771 Benign lipomatous neoplasm of kidney: Secondary | ICD-10-CM | POA: Diagnosis not present

## 2017-05-13 DIAGNOSIS — Z8639 Personal history of other endocrine, nutritional and metabolic disease: Secondary | ICD-10-CM | POA: Insufficient documentation

## 2017-05-13 DIAGNOSIS — Z7951 Long term (current) use of inhaled steroids: Secondary | ICD-10-CM | POA: Insufficient documentation

## 2017-05-13 HISTORY — PX: IR US GUIDE VASC ACCESS LEFT: IMG2389

## 2017-05-13 HISTORY — PX: IR EMBO TUMOR ORGAN ISCHEMIA INFARCT INC GUIDE ROADMAPPING: IMG5449

## 2017-05-13 HISTORY — PX: IR RENAL SUPRASEL UNI S&I MOD SED: IMG655

## 2017-05-13 LAB — CBC WITH DIFFERENTIAL/PLATELET
BASOS ABS: 0 10*3/uL (ref 0.0–0.1)
BASOS PCT: 0 %
EOS ABS: 0.1 10*3/uL (ref 0.0–0.7)
EOS PCT: 1 %
HEMATOCRIT: 41.2 % (ref 36.0–46.0)
Hemoglobin: 14.2 g/dL (ref 12.0–15.0)
Lymphocytes Relative: 28 %
Lymphs Abs: 1.5 10*3/uL (ref 0.7–4.0)
MCH: 31.1 pg (ref 26.0–34.0)
MCHC: 34.5 g/dL (ref 30.0–36.0)
MCV: 90.2 fL (ref 78.0–100.0)
MONO ABS: 0.4 10*3/uL (ref 0.1–1.0)
Monocytes Relative: 7 %
NEUTROS ABS: 3.5 10*3/uL (ref 1.7–7.7)
Neutrophils Relative %: 64 %
PLATELETS: 203 10*3/uL (ref 150–400)
RBC: 4.57 MIL/uL (ref 3.87–5.11)
RDW: 12.6 % (ref 11.5–15.5)
WBC: 5.5 10*3/uL (ref 4.0–10.5)

## 2017-05-13 LAB — BASIC METABOLIC PANEL
ANION GAP: 10 (ref 5–15)
BUN: 19 mg/dL (ref 6–20)
CALCIUM: 9.2 mg/dL (ref 8.9–10.3)
CO2: 22 mmol/L (ref 22–32)
CREATININE: 0.53 mg/dL (ref 0.44–1.00)
Chloride: 108 mmol/L (ref 101–111)
GLUCOSE: 102 mg/dL — AB (ref 65–99)
Potassium: 4 mmol/L (ref 3.5–5.1)
Sodium: 140 mmol/L (ref 135–145)

## 2017-05-13 LAB — ABO/RH: ABO/RH(D): B NEG

## 2017-05-13 LAB — PROTIME-INR
INR: 0.93
Prothrombin Time: 12.4 seconds (ref 11.4–15.2)

## 2017-05-13 LAB — TYPE AND SCREEN
ABO/RH(D): B NEG
Antibody Screen: NEGATIVE

## 2017-05-13 MED ORDER — FLUTICASONE PROPIONATE 50 MCG/ACT NA SUSP
2.0000 | Freq: Every day | NASAL | Status: DC
  Filled 2017-05-13: qty 16

## 2017-05-13 MED ORDER — CEFAZOLIN SODIUM-DEXTROSE 2-4 GM/100ML-% IV SOLN
INTRAVENOUS | Status: AC
  Administered 2017-05-13: 2 g via INTRAVENOUS
  Filled 2017-05-13: qty 100

## 2017-05-13 MED ORDER — VITAMIN D 1000 UNITS PO TABS
2000.0000 [IU] | ORAL_TABLET | Freq: Every day | ORAL | Status: DC
  Filled 2017-05-13: qty 2

## 2017-05-13 MED ORDER — LIDOCAINE HCL 1 % IJ SOLN
INTRAMUSCULAR | Status: AC
  Administered 2017-05-13: 10 mL
  Filled 2017-05-13: qty 20

## 2017-05-13 MED ORDER — NITROGLYCERIN IN D5W 100-5 MCG/ML-% IV SOLN
INTRAVENOUS | Status: AC
  Filled 2017-05-13: qty 250

## 2017-05-13 MED ORDER — SODIUM CHLORIDE 0.9 % IV SOLN
INTRAVENOUS | Status: DC
  Administered 2017-05-13: 12:00:00 via INTRAVENOUS

## 2017-05-13 MED ORDER — IOPAMIDOL (ISOVUE-300) INJECTION 61%
INTRAVENOUS | Status: AC
  Administered 2017-05-13: 85 mL
  Filled 2017-05-13: qty 200

## 2017-05-13 MED ORDER — VERAPAMIL HCL 2.5 MG/ML IV SOLN
INTRA_ARTERIAL | Status: AC | PRN
  Administered 2017-05-13: 16:00:00 via INTRA_ARTERIAL

## 2017-05-13 MED ORDER — PROMETHAZINE HCL 25 MG RE SUPP: 25.0000 mg | Freq: Three times a day (TID) | RECTAL | Status: DC | PRN

## 2017-05-13 MED ORDER — LIPIODOL ULTRAFLUID INJECTION
INTRAMUSCULAR | Status: AC | PRN
  Administered 2017-05-13: 10 mL via INTRA_ARTERIAL

## 2017-05-13 MED ORDER — SODIUM CHLORIDE 0.9 % IV SOLN
INTRAVENOUS | Status: AC
  Administered 2017-05-13: 17:00:00 via INTRAVENOUS

## 2017-05-13 MED ORDER — DOCUSATE SODIUM 100 MG PO CAPS
100.0000 mg | ORAL_CAPSULE | Freq: Two times a day (BID) | ORAL | Status: DC
  Filled 2017-05-13 (×2): qty 1

## 2017-05-13 MED ORDER — LIDOCAINE HCL (PF) 1 % IJ SOLN
INTRAMUSCULAR | Status: AC | PRN
  Administered 2017-05-13: 10 mL

## 2017-05-13 MED ORDER — HYDROCODONE-ACETAMINOPHEN 5-325 MG PO TABS: 1.0000 | ORAL_TABLET | ORAL | Status: DC | PRN

## 2017-05-13 MED ORDER — ALCOHOL 98 % IJ SOLN
INTRAMUSCULAR | Status: AC
  Filled 2017-05-13: qty 4

## 2017-05-13 MED ORDER — NITROGLYCERIN FOR UTERINE RELAXATION OPTIME 200MCG/ML
INTRAVENOUS | Status: AC | PRN
  Administered 2017-05-13: 200 ug via INTRAVENOUS

## 2017-05-13 MED ORDER — FENTANYL CITRATE (PF) 100 MCG/2ML IJ SOLN
INTRAMUSCULAR | Status: AC | PRN
  Administered 2017-05-13 (×4): 50 ug via INTRAVENOUS

## 2017-05-13 MED ORDER — SODIUM CHLORIDE 0.9 % IV SOLN: 250.0000 mL | INTRAVENOUS | Status: DC | PRN

## 2017-05-13 MED ORDER — VERAPAMIL HCL 2.5 MG/ML IV SOLN
INTRAVENOUS | Status: AC
  Filled 2017-05-13: qty 2

## 2017-05-13 MED ORDER — PROMETHAZINE HCL 25 MG PO TABS: 25.0000 mg | ORAL_TABLET | Freq: Three times a day (TID) | ORAL | Status: DC | PRN

## 2017-05-13 MED ORDER — FENTANYL CITRATE (PF) 100 MCG/2ML IJ SOLN
INTRAMUSCULAR | Status: AC
  Filled 2017-05-13: qty 4

## 2017-05-13 MED ORDER — PROGESTERONE MICRONIZED 100 MG PO CAPS
100.0000 mg | ORAL_CAPSULE | Freq: Every day | ORAL | Status: DC
  Filled 2017-05-13: qty 1

## 2017-05-13 MED ORDER — HEPARIN SODIUM (PORCINE) 1000 UNIT/ML IJ SOLN
INTRAMUSCULAR | Status: AC
  Filled 2017-05-13: qty 1

## 2017-05-13 MED ORDER — MIDAZOLAM HCL 2 MG/2ML IJ SOLN
INTRAMUSCULAR | Status: AC | PRN
  Administered 2017-05-13 (×6): 1 mg via INTRAVENOUS

## 2017-05-13 MED ORDER — ALCOHOL 98 % IJ SOLN
INTRAMUSCULAR | Status: AC | PRN
  Administered 2017-05-13: 10 mL

## 2017-05-13 MED ORDER — CEFAZOLIN SODIUM-DEXTROSE 2-4 GM/100ML-% IV SOLN
2.0000 g | INTRAVENOUS | Status: AC
  Administered 2017-05-13: 2 g via INTRAVENOUS

## 2017-05-13 MED ORDER — LIDOCAINE-EPINEPHRINE 1 %-1:100000 IJ SOLN: INTRAMUSCULAR | Status: AC | PRN

## 2017-05-13 MED ORDER — LIDOCAINE-EPINEPHRINE (PF) 2 %-1:200000 IJ SOLN: INTRAMUSCULAR | Status: AC | PRN

## 2017-05-13 MED ORDER — HYDROMORPHONE HCL-NACL 0.5-0.9 MG/ML-% IV SOSY
1.0000 mg | PREFILLED_SYRINGE | INTRAVENOUS | Status: DC | PRN
  Filled 2017-05-13: qty 2

## 2017-05-13 MED ORDER — SODIUM CHLORIDE 0.9% FLUSH: 3.0000 mL | INTRAVENOUS | Status: DC | PRN

## 2017-05-13 MED ORDER — SODIUM CHLORIDE 0.9% FLUSH
3.0000 mL | Freq: Two times a day (BID) | INTRAVENOUS | Status: DC
  Administered 2017-05-13: 3 mL via INTRAVENOUS

## 2017-05-13 MED ORDER — ONDANSETRON HCL 4 MG/2ML IJ SOLN: 4.0000 mg | Freq: Four times a day (QID) | INTRAMUSCULAR | Status: DC | PRN

## 2017-05-13 MED ORDER — MIDAZOLAM HCL 2 MG/2ML IJ SOLN
INTRAMUSCULAR | Status: AC
  Filled 2017-05-13: qty 6

## 2017-05-13 NOTE — Procedures (Signed)
Interventional Radiology Procedure Note  Procedure: Embolization of left renal AML.   Vascular Access: Left radial artery, 30F --> RadBand  Complications: None  Estimated Blood Loss: None  Recommendations: - Bedrest - RadBand down per protocol - Observe overnight - Hydrate - ADAT - Anticipate DC in am  Signed,  Criselda Peaches, MD

## 2017-05-13 NOTE — Progress Notes (Signed)
Patient admitted from IR with Band on her left wrist , TAMMY a AC and CHRIS icu charge nurse was called to assist with how to deflate the cuff since it is a new procedure for the floor RN, 3cc of air was deflated at 1720, no bleeding noted, hematoma  , good radial pulse on left hand, both hands are cool to touch. Patient denies pain, vital signs monitored and band deflated as instructed.To keep close monitoring.

## 2017-05-13 NOTE — Progress Notes (Signed)
MEDICATION-RELATED CONSULT NOTE   IR Procedure Consult - Anticoagulant/Antiplatelet PTA/Inpatient Med List Review by Pharmacist    Procedure: Embolization of left renal AML    Completed: 05/13/17 at ~1700  Post-Procedural bleeding risk per IR MD assessment:  low  Antithrombotic medications on inpatient or PTA profile prior to procedure:   Pt is not on any anticoag   - pharmacy will sign off  Dia Sitter, PharmD, BCPS 05/13/2017 5:13 PM

## 2017-05-13 NOTE — H&P (Signed)
Referring Physician(s): Eskew,Lawrence  Supervising Physician: Jacqulynn Cadet  Patient Status:  [facility] OP  TBA  Chief Complaint: Left renal angiomyolipoma   Subjective: Patient familiar to IR service from recent consultation with Dr. Laurence Ferrari on 04/15/17 to discuss treatment options for an incidentally noted left renal angiomyolipoma.She is a 71 yo femalewho had a CT scan of the chest in late July of this year to evaluate a palpable mass along her left chest wall which turned out to be a lipoma. However, an incidentally detected mass was noted arising from the left kidney. She subsequently underwent a dedicated renal protocol CT scan of the abdomen and pelvis on 03/30/2017 which identified a 7.2 cm enhancing soft tissue mass arising exophytic from the upper pole of the left kidney. The mass contains numerous areas of microscopic fat most consistent with an angiomyolipoma. Following consultation she was deemed an appropriate candidate for transcatheter embolization of the angiomyolipoma and presents today for the procedure. Past Medical History:  Diagnosis Date  . Abnormal breast finding 12/1998   left breast abnl  . Arthritis    --basal joint--right hand  . Carpal tunnel syndrome, right   . Diverticulosis 2003   mild  . Lumbar disc disorder 2000   bulging  . Menopause   . Osteoporosis    Fx L4 age 89 water skiing accident  . Thyroid disease 10/2014   thyroid nodules--bx'd large nodule and was benign--sees Gen. surgeon with [facility]   Past Surgical History:  Procedure Laterality Date  . AUGMENTATION MAMMAPLASTY  1983  . BREAST SURGERY  2010   implants removed ruptured  . CERVICAL BIOPSY  W/ LOOP ELECTRODE EXCISION  1996   CIN  . KNEE SURGERY  2012   meniscus rt knee (both)  . L-2 fracture  2009   fall in aerobics  . L-4 fracture   water skiing    . TENOSYNOVECTOMY Right 03/11/2016   Procedure: TENOSYNOVECTOMY FLEXOR RIGHT RING FINGER;  Surgeon: Daryll Brod, MD;   Location: [facility];  Service: Orthopedics;  Laterality: Right;  . thyroid nodule biopsy  10/2014   --[facility], [place]--  . TONSILLECTOMY AND ADENOIDECTOMY  1964     Allergies: Patient has no known allergies.  Medications: Prior to Admission medications   Medication Sig Start Date End Date Taking? Authorizing Provider  Cholecalciferol (VITAMIN D) 2000 UNITS tablet Take 2,000 Units by mouth daily.   Yes [provider]  Estradiol Acetate ([place]) 0.05 MG/24HR RING INSERT 1 RING VAGINALLY EVERY THREE MONTHS 02/27/17  Yes Nunzio Cobbs, MD  fluticasone [facility]) 50 MCG/ACT nasal spray Place 2 sprays into the nose daily. 07/13/12  Yes HodginMora Appl, MD  progesterone (PROMETRIUM) 100 MG capsule Take 1 capsule (100 mg total) by mouth daily. 02/27/17  Yes Nunzio Cobbs, MD     Vital Signs: BP (!) 151/72   Pulse 76   Temp 98 F (36.7 C) (Oral)   Resp 14   Ht 5' 7.5" (1.715 m)   Wt 160 lb 3.2 oz (72.7 kg)   LMP 08/18/1994 (Approximate)   SpO2 97%   BMI 24.72 kg/m   Physical Exam awake, alert. Chest clear to auscultation bilaterally. Heart with regular rate and rhythm. Abdomen soft, positive bowel sounds, nontender. No lower extremity edema, intact distal pulses  Imaging: No results found.  Labs:  CBC:  Recent Labs  05/13/17 1144  WBC 5.5  HGB 14.2  HCT 41.2  PLT 203  COAGS:  Recent Labs  05/13/17 1144  INR 0.93    BMP:  Recent Labs  05/13/17 1144  NA 140  K 4.0  CL 108  CO2 22  GLUCOSE 102*  BUN 19  CALCIUM 9.2  CREATININE 0.53  GFRNONAA >60  GFRAA >60    LIVER FUNCTION TESTS: No results for input(s): BILITOT, AST, ALT, ALKPHOS, PROT, ALBUMIN in the last 8760 hours.  Assessment and Plan: Patient with history of incidentally discovered 7.2 cm left renal angiomyolipoma; seen recently in consultation by Dr. Laurence Ferrari on 04/15/17 and deemed an appropriate candidate for transcatheter  embolization of the angiomyolipoma. She presents today for the procedure. Details/risks of procedure, including but not limited to, internal bleeding, infection, nontarget embolization injury to adjacent structures, discussed with patient and husband with their understanding and consent.Following the procedure the patient will be admitted to the hospital for overnight observation.   Electronically Signed: D. Rowe Robert, PA-C 05/13/2017, 1:55 PM   I spent a total of 30 minutes at the the patient's bedside AND on the patient's hospital floor or unit, greater than 50% of which was counseling/coordinating care for left renal arteriogram with embolization of angiomyolipoma

## 2017-05-13 NOTE — Progress Notes (Signed)
Band fully deflated and removed, Dr Laurence Ferrari  At the bedside.

## 2017-05-14 ENCOUNTER — Telehealth: Payer: Self-pay

## 2017-05-14 ENCOUNTER — Other Ambulatory Visit: Payer: PPO

## 2017-05-14 ENCOUNTER — Other Ambulatory Visit: Payer: Self-pay | Admitting: Radiology

## 2017-05-14 ENCOUNTER — Other Ambulatory Visit (HOSPITAL_COMMUNITY): Payer: PPO

## 2017-05-14 DIAGNOSIS — D1771 Benign lipomatous neoplasm of kidney: Secondary | ICD-10-CM

## 2017-05-14 DIAGNOSIS — D3002 Benign neoplasm of left kidney: Secondary | ICD-10-CM | POA: Diagnosis not present

## 2017-05-14 LAB — BASIC METABOLIC PANEL
ANION GAP: 10 (ref 5–15)
BUN: 13 mg/dL (ref 6–20)
CHLORIDE: 107 mmol/L (ref 101–111)
CO2: 20 mmol/L — AB (ref 22–32)
Calcium: 8.5 mg/dL — ABNORMAL LOW (ref 8.9–10.3)
Creatinine, Ser: 0.47 mg/dL (ref 0.44–1.00)
GFR calc Af Amer: >60 mL/min (ref >60)
GLUCOSE: 99 mg/dL (ref 65–99)
POTASSIUM: 3.8 mmol/L (ref 3.5–5.1)
Sodium: 137 mmol/L (ref 135–145)

## 2017-05-14 NOTE — Telephone Encounter (Signed)
Left message to call Kaitlyn at [phone]. 

## 2017-05-14 NOTE — Progress Notes (Signed)
Pt discharged home with spouse in stable condition. Discharge instructions given. Pt verbalized understanding. No immediate questions or concerns.

## 2017-05-14 NOTE — Telephone Encounter (Signed)
-----   Message from Nunzio Cobbs, MD sent at 05/11/2017 11:40 AM EDT ----- Please report results of EMB showing atrophy and polypoid cystic atrophy.  There is no cancer or precancer.  There was no specific pathology noted at the time of ultrasound visit; one wall was just a little irregular along one border. She does not need anything further at this time.  If she has any recurrent bleeding, I will recommend a dilation and curettage.

## 2017-05-14 NOTE — Discharge Summary (Signed)
Patient ID: Ann Vang MRN: [id number] DOB/AGE: [birth year] 71 y.o.  Admit date: 05/13/2017 Discharge date: 05/14/2017  Supervising Physician: Jacqulynn Cadet  Patient Status: [facility] - In-pt  Admission Diagnoses: Left renal angiomyolipoma  Discharge Diagnoses: Left renal angiomyolipoma, status post technically successful liquid and coil embolization of large left renal angiomyolipoma 05/13/17 via IV conscious sedation Active Problems:   Angiomyolipoma of left kidney  Past Medical History:  Diagnosis Date  . Abnormal breast finding 12/1998   left breast abnl  . Arthritis    --basal joint--right hand  . Carpal tunnel syndrome, right   . Diverticulosis 2003   mild  . Lumbar disc disorder 2000   bulging  . Menopause   . Osteoporosis    Fx L4 age 72 water skiing accident  . Thyroid disease 10/2014   thyroid nodules--bx'd large nodule and was benign--sees Gen. surgeon with [facility]   Past Surgical History:  Procedure Laterality Date  . AUGMENTATION MAMMAPLASTY  1983  . BREAST SURGERY  2010   implants removed ruptured  . CERVICAL BIOPSY  W/ LOOP ELECTRODE EXCISION  1996   CIN  . IR EMBO TUMOR ORGAN ISCHEMIA INFARCT INC GUIDE ROADMAPPING  05/13/2017  . IR RENAL SUPRASEL UNI S&I MOD SED  05/13/2017  . IR US GUIDE VASC ACCESS LEFT  05/13/2017  . KNEE SURGERY  2012   meniscus rt knee (both)  . L-2 fracture  2009   fall in aerobics  . L-4 fracture   water skiing    . TENOSYNOVECTOMY Right 03/11/2016   Procedure: TENOSYNOVECTOMY FLEXOR RIGHT RING FINGER;  Surgeon: Daryll Brod, MD;  Location: [facility];  Service: Orthopedics;  Laterality: Right;  . thyroid nodule biopsy  10/2014   --[facility], [place]--  . TONSILLECTOMY AND ADENOIDECTOMY  1964      Discharged Condition: good  Hospital Course: Ann Vang is a 71 year old female who had a CT scan of the chest in late July of this year to evaluate a palpable mass along her left chest wall which  turned out to be a lipoma. However, an incidentally detected mass was noted arising from the left kidney. She subsequently underwent a dedicated renal protocol CT scan of the abdomen and pelvis on 03/30/2017 which identified a 7.2 cm enhancing soft tissue mass arising exophytic from the upper pole of the left kidney. The mass contained numerous areas of microscopic fat most consistent with an angiomyolipoma.Following consultation with Dr. Laurence Ferrari on 04/15/17 she was deemed an appropriate candidate for transcatheter embolization of the angiomyolipoma . On 05/13/17 she underwent technically successful liquid and coil embolization of the left renal angiomyolipoma via IV conscious sedation and left radial artery access at [facility]. The procedure was performed without immediate complications and she was observed in hospital overnight. On the morning of discharge she was stable. She was able to void, ambulate and tolerate her diet without significant difficulty. She denied any fever, chest pain, dyspnea, cough, abdominal or flank pain, nausea, vomiting, dysuria or hematuria. Follow-up creatinine was 0.47. Findings were discussed with Dr. Laurence Ferrari and she was deemed stable for discharge at this time. She will be scheduled for follow-up in the IR clinic with Dr. Laurence Ferrari in 2-4 weeks with BMP. She will continue follow-up with Dr. Estill Dooms as scheduled. She will continue her current home medications. She was told to contact our service with any additional questions or concerns.  Consults: none  Significant Diagnostic Studies:  Results for orders placed or performed during the  hospital encounter of 16/07/37  Basic metabolic panel  Result Value Ref Range   Sodium 140 135 - 145 mmol/L   Potassium 4.0 3.5 - 5.1 mmol/L   Chloride 108 101 - 111 mmol/L   CO2 22 22 - 32 mmol/L   Glucose, Bld 102 (H) 65 - 99 mg/dL   BUN 19 6 - 20 mg/dL   Creatinine, Ser 0.53 0.44 - 1.00 mg/dL   Calcium 9.2 8.9 - 10.3  mg/dL   GFR calc non Af Amer >60 >60 mL/min   GFR calc Af Amer >60 >60 mL/min   Anion gap 10 5 - 15  CBC with Differential/Platelet  Result Value Ref Range   WBC 5.5 4.0 - 10.5 K/uL   RBC 4.57 3.87 - 5.11 MIL/uL   Hemoglobin 14.2 12.0 - 15.0 g/dL   HCT 41.2 36.0 - 46.0 %   MCV 90.2 78.0 - 100.0 fL   MCH 31.1 26.0 - 34.0 pg   MCHC 34.5 30.0 - 36.0 g/dL   RDW 12.6 11.5 - 15.5 %   Platelets 203 150 - 400 K/uL   Neutrophils Relative % 64 %   Neutro Abs 3.5 1.7 - 7.7 K/uL   Lymphocytes Relative 28 %   Lymphs Abs 1.5 0.7 - 4.0 K/uL   Monocytes Relative 7 %   Monocytes Absolute 0.4 0.1 - 1.0 K/uL   Eosinophils Relative 1 %   Eosinophils Absolute 0.1 0.0 - 0.7 K/uL   Basophils Relative 0 %   Basophils Absolute 0.0 0.0 - 0.1 K/uL  Protime-INR  Result Value Ref Range   Prothrombin Time 12.4 11.4 - 15.2 seconds   INR 1.06   Basic metabolic panel  Result Value Ref Range   Sodium 137 135 - 145 mmol/L   Potassium 3.8 3.5 - 5.1 mmol/L   Chloride 107 101 - 111 mmol/L   CO2 20 (L) 22 - 32 mmol/L   Glucose, Bld 99 65 - 99 mg/dL   BUN 13 6 - 20 mg/dL   Creatinine, Ser 0.47 0.44 - 1.00 mg/dL   Calcium 8.5 (L) 8.9 - 10.3 mg/dL   GFR calc non Af Amer >60 >60 mL/min   GFR calc Af Amer >60 >60 mL/min   Anion gap 10 5 - 15     Treatments: Technically successful liquid and coil embolization of large left renal angiomyolipoma 05/13/17 via IV conscious sedation and left radial artery access  Discharge Exam: Blood pressure 138/78, pulse 72, temperature 98 F (36.7 C), temperature source Oral, resp. rate 16, height 5' 7.5" (1.715 m), weight 160 lb 3.2 oz (72.7 kg), last menstrual period 08/18/1994, SpO2 99 %. Patient awake, alert. Chest clear to auscultation bilaterally. Heart with regular rate and rhythm. Abdomen soft, positive bowel sounds, nontender. No lower extremity edema. Left radial artery access site soft, nontender, intact pulse, minimal ecchymosis.  Disposition: 01-Home or Self  Care  Discharge Instructions    Call MD for:  difficulty breathing, headache or visual disturbances    Complete by:  As directed    Call MD for:  extreme fatigue    Complete by:  As directed    Call MD for:  hives    Complete by:  As directed    Call MD for:  persistant dizziness or light-headedness    Complete by:  As directed    Call MD for:  persistant nausea and vomiting    Complete by:  As directed    Call MD for:  redness, tenderness,  or signs of infection (pain, swelling, redness, odor or green/yellow discharge around incision site)    Complete by:  As directed    Call MD for:  severe uncontrolled pain    Complete by:  As directed    Call MD for:  temperature >100.4    Complete by:  As directed    Change dressing (specify)    Complete by:  As directed    May change bandage over left wrist in 24 hours and place Band-Aid over site for the next 2-3 days.   Diet - low sodium heart healthy    Complete by:  As directed    Driving Restrictions    Complete by:  As directed    No driving for next 24 hours   Increase activity slowly    Complete by:  As directed    Lifting restrictions    Complete by:  As directed    Avoid any heavy lifting for the next 3-4 days   May shower / Bathe    Complete by:  As directed    May walk up steps    Complete by:  As directed      Allergies as of 05/14/2017   No Known Allergies     Medication List    TAKE these medications   Estradiol Acetate 0.05 MG/24HR Ring Commonly known as:  FEMRING INSERT 1 RING VAGINALLY EVERY THREE MONTHS   fluticasone 50 MCG/ACT nasal spray Commonly known as:  FLONASE Place 2 sprays into the nose daily.   progesterone 100 MG capsule Commonly known as:  PROMETRIUM Take 1 capsule (100 mg total) by mouth daily.   Vitamin D 2000 units tablet Take 2,000 Units by mouth daily.            Discharge Care Instructions        Start     Ordered   05/14/17 0000  Increase activity slowly     05/14/17 0938    05/14/17 0000  Diet - low sodium heart healthy     05/14/17 0938   05/14/17 0000  May walk up steps     05/14/17 0938   05/14/17 0000  May shower / Bathe     05/14/17 2376   05/14/17 0000  Driving Restrictions    Comments:  No driving for next 24 hours   05/14/17 0938   05/14/17 0000  Lifting restrictions    Comments:  Avoid any heavy lifting for the next 3-4 days   05/14/17 2831   05/14/17 0000  Change dressing (specify)    Comments:  May change bandage over left wrist in 24 hours and place Band-Aid over site for the next 2-3 days.   05/14/17 5176   05/14/17 0000  Call MD for:  temperature >100.4     05/14/17 1607   05/14/17 0000  Call MD for:  persistant nausea and vomiting     05/14/17 0938   05/14/17 0000  Call MD for:  severe uncontrolled pain     05/14/17 0938   05/14/17 0000  Call MD for:  redness, tenderness, or signs of infection (pain, swelling, redness, odor or green/yellow discharge around incision site)     05/14/17 0938   05/14/17 0000  Call MD for:  difficulty breathing, headache or visual disturbances     05/14/17 0938   05/14/17 0000  Call MD for:  hives     05/14/17 0938   05/14/17 0000  Call MD for:  persistant dizziness  or light-headedness     05/14/17 0938   05/14/17 0000  Call MD for:  extreme fatigue     05/14/17 5597     Follow-up Information    Jacqulynn Cadet, MD Follow up.   Specialties:  Interventional Radiology, Radiology Why:  Radiology will call you with follow up appointment with Dr. Laurence Ferrari in the [place] clinic in the next 2-4 weeks; call [phone] or [phone] with any questions Contact information: [address] [place] [address] [phone]        Christy Sartorius, MD Follow up.   Specialty:  Urology Why:  Follow-up with Dr. Estill Dooms as scheduled Contact information: [address] [place] [address] [phone]            Electronically Signed: D. Rowe Robert, PA-C 05/14/2017, 9:42 AM   I  have spent Less than 30 minutes discharging Ann Vang.

## 2017-05-14 NOTE — Discharge Instructions (Signed)

## 2017-05-20 ENCOUNTER — Other Ambulatory Visit: Payer: Self-pay | Admitting: *Deleted

## 2017-05-21 ENCOUNTER — Other Ambulatory Visit: Payer: PPO

## 2017-05-21 ENCOUNTER — Other Ambulatory Visit: Payer: PPO | Admitting: Obstetrics and Gynecology

## 2017-05-25 ENCOUNTER — Other Ambulatory Visit: Payer: Self-pay | Admitting: *Deleted

## 2017-05-25 DIAGNOSIS — M810 Age-related osteoporosis without current pathological fracture: Secondary | ICD-10-CM | POA: Diagnosis not present

## 2017-05-25 DIAGNOSIS — Z0000 Encounter for general adult medical examination without abnormal findings: Secondary | ICD-10-CM | POA: Diagnosis not present

## 2017-05-25 DIAGNOSIS — D1771 Benign lipomatous neoplasm of kidney: Secondary | ICD-10-CM

## 2017-05-25 DIAGNOSIS — N2889 Other specified disorders of kidney and ureter: Secondary | ICD-10-CM

## 2017-05-25 DIAGNOSIS — R82998 Other abnormal findings in urine: Secondary | ICD-10-CM | POA: Diagnosis not present

## 2017-05-25 DIAGNOSIS — E041 Nontoxic single thyroid nodule: Secondary | ICD-10-CM | POA: Diagnosis not present

## 2017-05-26 NOTE — Telephone Encounter (Signed)
Ultrasound appointment completed on 05/05/17

## 2017-05-27 ENCOUNTER — Ambulatory Visit
Admission: RE | Admit: 2017-05-27 | Discharge: 2017-05-27 | Disposition: A | Discharge status: Home / Self Care | Payer: PPO | Source: Ambulatory Visit | Attending: Radiology | Admitting: Radiology

## 2017-05-27 ENCOUNTER — Ambulatory Visit
Admission: RE | Admit: 2017-05-27 | Discharge: 2017-05-27 | Disposition: A | Discharge status: Home / Self Care | Payer: PPO | Source: Ambulatory Visit | Attending: Internal Medicine | Admitting: Internal Medicine

## 2017-05-27 DIAGNOSIS — D3002 Benign neoplasm of left kidney: Secondary | ICD-10-CM | POA: Diagnosis not present

## 2017-05-27 DIAGNOSIS — Z9889 Other specified postprocedural states: Secondary | ICD-10-CM | POA: Diagnosis not present

## 2017-05-27 DIAGNOSIS — Z1231 Encounter for screening mammogram for malignant neoplasm of breast: Secondary | ICD-10-CM | POA: Diagnosis not present

## 2017-05-27 HISTORY — PX: IR RADIOLOGIST EVAL & MGMT: IMG5224

## 2017-05-27 NOTE — Progress Notes (Signed)
Chief Complaint: Patient was seen in consultation today for  Chief Complaint  Patient presents with  . Follow-up    2 weel follow up Embolization of Left Renal Angiomyolipoma     History of Present Illness: Ann Vang is a 71 y.o. female who underwent successful embolization of left renal angiomyolipoma via (L)radial artery approach on 9/26. She was admitted overnight for observation and discharged the following day in stable condition. She developed some fever and myalgias c/w post embolic syndrome while at home that lasted a few days but eventually subsided. She feels well now. No hematuria or voiding difficulties. Denies abd or flank/back pain. No issues from her (L)wrist arterial access site.  Past Medical History:  Diagnosis Date  . Abnormal breast finding 12/1998   left breast abnl  . Arthritis    --basal joint--right hand  . Carpal tunnel syndrome, right   . Diverticulosis 2003   mild  . Lumbar disc disorder 2000   bulging  . Menopause   . Osteoporosis    Fx L4 age 18 water skiing accident  . Thyroid disease 10/2014   thyroid nodules--bx'd large nodule and was benign--sees Gen. surgeon with [facility]    Past Surgical History:  Procedure Laterality Date  . AUGMENTATION MAMMAPLASTY  1983   Implants have been removed  . BREAST SURGERY  2010   implants removed ruptured  . CERVICAL BIOPSY  W/ LOOP ELECTRODE EXCISION  1996   [facility]  . IR EMBO TUMOR ORGAN ISCHEMIA INFARCT INC GUIDE ROADMAPPING  05/13/2017  . IR RENAL SUPRASEL UNI S&I MOD SED  05/13/2017  . IR US GUIDE VASC ACCESS LEFT  05/13/2017  . KNEE SURGERY  2012   meniscus rt knee (both)  . L-2 fracture  2009   fall in aerobics  . L-4 fracture   water skiing    . TENOSYNOVECTOMY Right 03/11/2016   Procedure: TENOSYNOVECTOMY FLEXOR RIGHT RING FINGER;  Surgeon: Daryll Brod, MD;  Location: [facility];  Service: Orthopedics;  Laterality: Right;  . thyroid nodule biopsy  10/2014   --[facility], [place]--  . TONSILLECTOMY AND ADENOIDECTOMY  1964    Allergies: Patient has no known allergies.  Medications: Prior to Admission medications   Medication Sig Start Date End Date Taking? Authorizing Provider  Cholecalciferol (VITAMIN D) 2000 UNITS tablet Take 2,000 Units by mouth daily.   Yes [provider]  Estradiol Acetate ([place]) 0.05 MG/24HR RING INSERT 1 RING VAGINALLY EVERY THREE MONTHS 02/27/17  Yes Nunzio Cobbs, MD  fluticasone [facility]) 50 MCG/ACT nasal spray Place 2 sprays into the nose daily. 07/13/12  Yes HodginMora Appl, MD  progesterone (PROMETRIUM) 100 MG capsule Take 1 capsule (100 mg total) by mouth daily. 02/27/17  Yes Nunzio Cobbs, MD     Family History  Problem Relation Age of Onset  . Osteoporosis Mother   . Alzheimer's disease Mother   . Hypertension Father   . Heart attack Father   . Osteoporosis Maternal Grandmother   . Colon cancer Maternal Grandmother   . Colon cancer Other     Social History   Social History  . Marital status: Married    Spouse name: N/A  . Number of children: N/A  . Years of education: N/A   Social History Main Topics  . Smoking status: Former Smoker    Quit date: 12/21/1972  . Smokeless tobacco: Never Used  . Alcohol use 8.4 oz/week    14  Standard drinks or equivalent per week     Comment: 2 glasses of wine at night  . Drug use: No  . Sexual activity: Yes    Partners: Male    Birth control/ protection: Post-menopausal   Other Topics Concern  . Not on file   Social History Narrative  . No narrative on file     Review of Systems: A 12 point ROS discussed and pertinent positives are indicated in the HPI above.  All other systems are negative.  Review of Systems  Vital Signs: BP 137/78   Pulse 74   Temp 98.1 F (36.7 C) (Oral)   Resp 14   Ht 5' 7.1" (1.704 m)   Wt 156 lb 8 oz (71 kg)   LMP 08/18/1994 (Approximate)   SpO2 98%   BMI 24.44 kg/m    Physical Exam (L)radial artery palpable, no hematoma Hand warm, brisk cap refill Abd: soft, NT  Imaging: Ir Angiogram Renal Right Selective  Result Date: 05/13/2017 INDICATION: 71 year old female with a large (7 cm) hypervascular left renal angiomyolipoma. EXAM: IR EMBO TUMOR ORGAN ISCHEMIA INFARCT INC GUIDE ROADMAPPING; IR RENAL SUPRASEL UNILATERAL S+I MODERATE SEDATION; IR ULTRASOUND GUIDANCE VASC ACCESS LEFT MEDICATIONS: A total of 400 mcg nitroglycerin, 2.5 mg verapamil and 3000 units of heparin was administered into the radial artery. A total of approximately 3 mL dehydrated alcohol and 3 mL of lump high in all were injected intra-arterially into the tumor vasculature. ANESTHESIA/SEDATION: Moderate (conscious) sedation was employed during this procedure. A total of Versed 6 mg and Fentanyl 200 mcg was administered intravenously. Moderate Sedation Time: 76 minutes. The patient's level of consciousness and vital signs were monitored continuously by radiology nursing throughout the procedure under my direct supervision. CONTRAST:  100 mL Isovue 370 FLUOROSCOPY TIME:  Fluoroscopy Time: 17 minutes 12 seconds (738 mGy). COMPLICATIONS: None immediate. PROCEDURE: Informed consent was obtained from the patient following explanation of the procedure, risks, benefits and alternatives. The patient understands, agrees and consents for the procedure. All questions were addressed. A time out was performed prior to the initiation of the procedure. Maximal barrier sterile technique utilized including caps, mask, sterile gowns, sterile gloves, large sterile drape, hand hygiene, and Betadine prep. The left radial artery was interrogated with ultrasound and found to be widely patent. An image was obtained and stored for the medical record. Local anesthesia was attained by infiltration with 1% lidocaine. A small dermatotomy was made. Under real-time sonographic guidance, the vessel was punctured with a 21 gauge  micropuncture needle. Using standard technique, the initial micro needle was exchanged over a 0.021 micro wire for a hydrophilic 5 French slim sheath. The radial cocktail was administered. A 5 French angled catheter was then advanced over a Glidewire up the arm, into the thoracic aorta and down into the abdominal aorta. The catheter was used to select the left renal artery. A left renal arteriogram was performed. There is a large hypervascular mass arising exophytic from the interpolar region of the left kidney. A Renegade high flow microcatheter was then advanced over a Fathom 14 wire and multiple super selective renal arteriography was performed until the supplying artery was successfully identified. Liquid embolization was then performed using a 50/50 solution of dehydrated alcohol and lipiodol. Embolization was performed to near stasis. The trunk to the feeding artery was then coil embolized using a 2 x 4 by 2 Diamond interlock coil followed by a 5 by 8 soft interlock coil. The microcatheter was removed. Angiography was then performed  from the origin of the renal artery in multiple obliquities. There is complete occlusion of all vascularity within the tumor. No definite decreased perfusion of the renal artery. There is a small amount of the lower pole which does not perfused, this is due to the presence of an inferior pole accessory artery which was seen on the prior CT scan. The 5 French catheter was removed.  The rad band was applied. IMPRESSION: Technically successful liquid and coil embolization of large left renal angiomyolipoma. Signed, Criselda Peaches, MD [facility] Radiology Electronically Signed   By: Jacqulynn Cadet M.D.   On: 05/13/2017 17:23   [place] Sonohysterogram  Result Date: 05/05/2017 SEE PROGRESS NOTES FOR RESULTS   Ir US Guide Vasc Access Left  Result Date: 05/13/2017 INDICATION: 71 year old female with a large (7 cm) hypervascular  left renal angiomyolipoma. EXAM: IR EMBO TUMOR ORGAN ISCHEMIA INFARCT INC GUIDE ROADMAPPING; IR RENAL SUPRASEL UNILATERAL S+I MODERATE SEDATION; IR ULTRASOUND GUIDANCE VASC ACCESS LEFT MEDICATIONS: A total of 400 mcg nitroglycerin, 2.5 mg verapamil and 3000 units of heparin was administered into the radial artery. A total of approximately 3 mL dehydrated alcohol and 3 mL of lump high in all were injected intra-arterially into the tumor vasculature. ANESTHESIA/SEDATION: Moderate (conscious) sedation was employed during this procedure. A total of Versed 6 mg and Fentanyl 200 mcg was administered intravenously. Moderate Sedation Time: 76 minutes. The patient's level of consciousness and vital signs were monitored continuously by radiology nursing throughout the procedure under my direct supervision. CONTRAST:  100 mL Isovue 370 FLUOROSCOPY TIME:  Fluoroscopy Time: 17 minutes 12 seconds (738 mGy). COMPLICATIONS: None immediate. PROCEDURE: Informed consent was obtained from the patient following explanation of the procedure, risks, benefits and alternatives. The patient understands, agrees and consents for the procedure. All questions were addressed. A time out was performed prior to the initiation of the procedure. Maximal barrier sterile technique utilized including caps, mask, sterile gowns, sterile gloves, large sterile drape, hand hygiene, and Betadine prep. The left radial artery was interrogated with ultrasound and found to be widely patent. An image was obtained and stored for the medical record. Local anesthesia was attained by infiltration with 1% lidocaine. A small dermatotomy was made. Under real-time sonographic guidance, the vessel was punctured with a 21 gauge micropuncture needle. Using standard technique, the initial micro needle was exchanged over a 0.021 micro wire for a hydrophilic 5 French slim sheath. The radial cocktail was administered. A 5 French angled catheter was then advanced over a Glidewire  up the arm, into the thoracic aorta and down into the abdominal aorta. The catheter was used to select the left renal artery. A left renal arteriogram was performed. There is a large hypervascular mass arising exophytic from the interpolar region of the left kidney. A Renegade high flow microcatheter was then advanced over a Fathom 14 wire and multiple super selective renal arteriography was performed until the supplying artery was successfully identified. Liquid embolization was then performed using a 50/50 solution of dehydrated alcohol and lipiodol. Embolization was performed to near stasis. The trunk to the feeding artery was then coil embolized using a 2 x 4 by 2 Diamond interlock coil followed by a 5 by 8 soft interlock coil. The microcatheter was removed. Angiography was then performed from the origin of the renal artery in multiple obliquities. There is complete occlusion of all vascularity within the tumor. No definite decreased perfusion of the renal artery. There is a small amount of  the lower pole which does not perfused, this is due to the presence of an inferior pole accessory artery which was seen on the prior CT scan. The 5 French catheter was removed.  The rad band was applied. IMPRESSION: Technically successful liquid and coil embolization of large left renal angiomyolipoma. Signed, Criselda Peaches, MD [facility] Radiology Electronically Signed   By: Jacqulynn Cadet M.D.   On: 05/13/2017 17:23   Mm Screening Breast Tomo Bilateral  Result Date: 05/27/2017 CLINICAL DATA:  Screening. EXAM: 2D DIGITAL SCREENING BILATERAL MAMMOGRAM WITH CAD AND ADJUNCT TOMO COMPARISON:  Previous exam(s). ACR Breast Density Category c: The breast tissue is heterogeneously dense, which may obscure small masses. FINDINGS: There are no findings suspicious for malignancy. Images were processed with CAD. IMPRESSION: No mammographic evidence of malignancy. A result  letter of this screening mammogram will be mailed directly to the patient. RECOMMENDATION: Screening mammogram in one year. (Code:SM-B-01Y) BI-RADS CATEGORY  1: Negative. Electronically Signed   By: Kristopher Oppenheim M.D.   On: 05/27/2017 12:48   Ir Embo Tumor Organ Ischemia Infarct Inc Guide Roadmapping  Result Date: 05/13/2017 INDICATION: 71 year old female with a large (7 cm) hypervascular left renal angiomyolipoma. EXAM: IR EMBO TUMOR ORGAN ISCHEMIA INFARCT INC GUIDE ROADMAPPING; IR RENAL SUPRASEL UNILATERAL S+I MODERATE SEDATION; IR ULTRASOUND GUIDANCE VASC ACCESS LEFT MEDICATIONS: A total of 400 mcg nitroglycerin, 2.5 mg verapamil and 3000 units of heparin was administered into the radial artery. A total of approximately 3 mL dehydrated alcohol and 3 mL of lump high in all were injected intra-arterially into the tumor vasculature. ANESTHESIA/SEDATION: Moderate (conscious) sedation was employed during this procedure. A total of Versed 6 mg and Fentanyl 200 mcg was administered intravenously. Moderate Sedation Time: 76 minutes. The patient's level of consciousness and vital signs were monitored continuously by radiology nursing throughout the procedure under my direct supervision. CONTRAST:  100 mL Isovue 370 FLUOROSCOPY TIME:  Fluoroscopy Time: 17 minutes 12 seconds (738 mGy). COMPLICATIONS: None immediate. PROCEDURE: Informed consent was obtained from the patient following explanation of the procedure, risks, benefits and alternatives. The patient understands, agrees and consents for the procedure. All questions were addressed. A time out was performed prior to the initiation of the procedure. Maximal barrier sterile technique utilized including caps, mask, sterile gowns, sterile gloves, large sterile drape, hand hygiene, and Betadine prep. The left radial artery was interrogated with ultrasound and found to be widely patent. An image was obtained and stored for the medical record. Local anesthesia was  attained by infiltration with 1% lidocaine. A small dermatotomy was made. Under real-time sonographic guidance, the vessel was punctured with a 21 gauge micropuncture needle. Using standard technique, the initial micro needle was exchanged over a 0.021 micro wire for a hydrophilic 5 French slim sheath. The radial cocktail was administered. A 5 French angled catheter was then advanced over a Glidewire up the arm, into the thoracic aorta and down into the abdominal aorta. The catheter was used to select the left renal artery. A left renal arteriogram was performed. There is a large hypervascular mass arising exophytic from the interpolar region of the left kidney. A Renegade high flow microcatheter was then advanced over a Fathom 14 wire and multiple super selective renal arteriography was performed until the supplying artery was successfully identified. Liquid embolization was then performed using a 50/50 solution of dehydrated alcohol and lipiodol. Embolization was performed to near stasis. The trunk to the feeding artery was then coil embolized  using a 2 x 4 by 2 Diamond interlock coil followed by a 5 by 8 soft interlock coil. The microcatheter was removed. Angiography was then performed from the origin of the renal artery in multiple obliquities. There is complete occlusion of all vascularity within the tumor. No definite decreased perfusion of the renal artery. There is a small amount of the lower pole which does not perfused, this is due to the presence of an inferior pole accessory artery which was seen on the prior CT scan. The 5 French catheter was removed.  The rad band was applied. IMPRESSION: Technically successful liquid and coil embolization of large left renal angiomyolipoma. Signed, Criselda Peaches, MD [facility] Radiology Electronically Signed   By: Jacqulynn Cadet M.D.   On: 05/13/2017 17:23    Labs:  CBC:  Recent Labs  05/13/17 1144    WBC 5.5  HGB 14.2  HCT 41.2  PLT 203    COAGS:  Recent Labs  05/13/17 1144  INR 0.93    BMP:  Recent Labs  05/13/17 1144 05/14/17 0430  NA 140 137  K 4.0 3.8  CL 108 107  CO2 22 20*  GLUCOSE 102* 99  BUN 19 13  CALCIUM 9.2 8.5*  CREATININE 0.53 0.47  GFRNONAA >60 >60  GFRAA >60 >60    LIVER FUNCTION TESTS: No results for input(s): BILITOT, AST, ALT, ALKPHOS, PROT, ALBUMIN in the last 8760 hours.  TUMOR MARKERS: No results for input(s): AFPTM, CEA, CA199, CHROMGRNA in the last 8760 hours.  Assessment and Plan: S/p embolization of (L)renal AML on 9/26 Doing well. No restrictions Plan for 6 month CT and recheck.  Thank you for this interesting consult.  I greatly enjoyed meeting Ann Vang and look forward to participating in their care.  A copy of this report was sent to the requesting provider on this date.  Electronically Signed: Ascencion Dike 05/27/2017, 12:52 PM   I spent a total of 20 minutes in face to face in clinical consultation, greater than 50% of which was counseling/coordinating care for follow up left renal AML

## 2017-05-28 NOTE — Telephone Encounter (Signed)
Call to patient regarding results. Per ROI can leave message on cell number. Voice mail has name confirmation. Left message calling to follow-up regarding results and there is nothing wrong. Call back and ask for triage nurse.

## 2017-05-28 NOTE — Telephone Encounter (Signed)
Call from patient. Advised of endometrial biopsy result as directed by Dr Quincy Simmonds.  Stressed importance to call if experiences any vaginal bleeding as Dr Quincy Simmonds would recommend D&C. Patient agreeable to instructions.   Encounter closed.

## 2017-06-01 ENCOUNTER — Encounter: Payer: Self-pay | Admitting: Interventional Radiology

## 2017-06-01 DIAGNOSIS — R918 Other nonspecific abnormal finding of lung field: Secondary | ICD-10-CM | POA: Diagnosis not present

## 2017-06-01 DIAGNOSIS — E041 Nontoxic single thyroid nodule: Secondary | ICD-10-CM | POA: Diagnosis not present

## 2017-06-01 DIAGNOSIS — D3000 Benign neoplasm of unspecified kidney: Secondary | ICD-10-CM | POA: Diagnosis not present

## 2017-06-01 DIAGNOSIS — Z6824 Body mass index (BMI) 24.0-24.9, adult: Secondary | ICD-10-CM | POA: Diagnosis not present

## 2017-06-01 DIAGNOSIS — Z0000 Encounter for general adult medical examination without abnormal findings: Secondary | ICD-10-CM | POA: Diagnosis not present

## 2017-06-01 DIAGNOSIS — Z1389 Encounter for screening for other disorder: Secondary | ICD-10-CM | POA: Diagnosis not present

## 2017-06-01 DIAGNOSIS — Z23 Encounter for immunization: Secondary | ICD-10-CM | POA: Diagnosis not present

## 2017-06-01 DIAGNOSIS — M810 Age-related osteoporosis without current pathological fracture: Secondary | ICD-10-CM | POA: Diagnosis not present

## 2017-06-10 DIAGNOSIS — L821 Other seborrheic keratosis: Secondary | ICD-10-CM | POA: Diagnosis not present

## 2017-06-10 DIAGNOSIS — Z86018 Personal history of other benign neoplasm: Secondary | ICD-10-CM | POA: Diagnosis not present

## 2017-06-10 DIAGNOSIS — D225 Melanocytic nevi of trunk: Secondary | ICD-10-CM | POA: Diagnosis not present

## 2017-06-10 DIAGNOSIS — L708 Other acne: Secondary | ICD-10-CM | POA: Diagnosis not present

## 2017-06-10 DIAGNOSIS — Z23 Encounter for immunization: Secondary | ICD-10-CM | POA: Diagnosis not present

## 2017-06-10 DIAGNOSIS — D1721 Benign lipomatous neoplasm of skin and subcutaneous tissue of right arm: Secondary | ICD-10-CM | POA: Diagnosis not present

## 2017-06-10 DIAGNOSIS — D485 Neoplasm of uncertain behavior of skin: Secondary | ICD-10-CM | POA: Diagnosis not present

## 2017-06-10 DIAGNOSIS — D224 Melanocytic nevi of scalp and neck: Secondary | ICD-10-CM | POA: Diagnosis not present

## 2017-06-10 DIAGNOSIS — Z808 Family history of malignant neoplasm of other organs or systems: Secondary | ICD-10-CM | POA: Diagnosis not present

## 2017-06-10 DIAGNOSIS — D2272 Melanocytic nevi of left lower limb, including hip: Secondary | ICD-10-CM | POA: Diagnosis not present

## 2017-06-17 ENCOUNTER — Encounter: Payer: Self-pay | Admitting: Interventional Radiology

## 2017-07-01 DIAGNOSIS — E041 Nontoxic single thyroid nodule: Secondary | ICD-10-CM | POA: Diagnosis not present

## 2017-09-29 DIAGNOSIS — M25562 Pain in left knee: Secondary | ICD-10-CM | POA: Diagnosis not present

## 2017-10-05 DIAGNOSIS — H52203 Unspecified astigmatism, bilateral: Secondary | ICD-10-CM | POA: Diagnosis not present

## 2017-10-05 DIAGNOSIS — D3131 Benign neoplasm of right choroid: Secondary | ICD-10-CM | POA: Diagnosis not present

## 2017-10-05 DIAGNOSIS — H2513 Age-related nuclear cataract, bilateral: Secondary | ICD-10-CM | POA: Diagnosis not present

## 2017-10-05 DIAGNOSIS — H35372 Puckering of macula, left eye: Secondary | ICD-10-CM | POA: Diagnosis not present

## 2017-11-07 IMAGING — MR MR [PERSON_NAME]*[PERSON_NAME]* W/O CM
6 series · 40 of 40 positions shown · non-contrast
Comparison: None.

CLINICAL DATA: Pain, swelling and limited range motion of the ring
finger for 3 months. No specific injury.

EXAM:
MRI OF THE RIGHT HAND WITHOUT CONTRAST
TECHNIQUE: Multiplanar, multisequence MR imaging was performed. No intravenous
contrast was administered.

[Series 7: T2 fat-sat · axial · right · 3.0mm · 0.38mm/px · z∈[-76,+69]mm · 12 of 50 slices shown (1 of 3)]
[im 1/50]
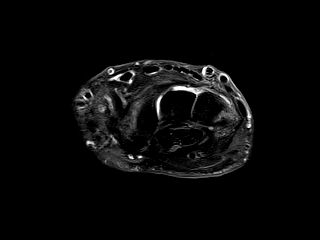
[im 5/50]
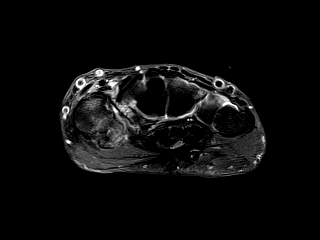
[im 9/50]
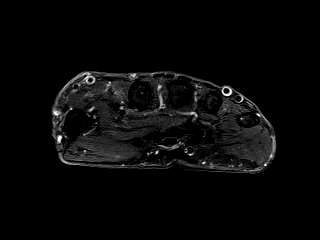
[im 14/50]
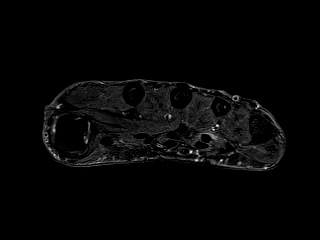
[im 18/50]
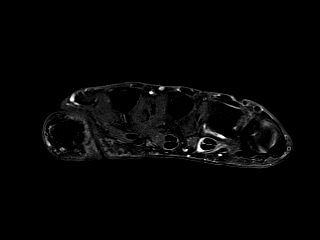
[im 23/50]
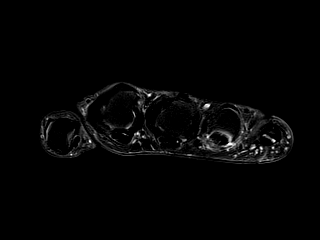
[im 27/50]
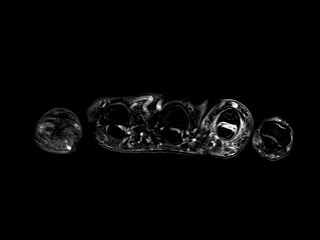
[im 32/50]
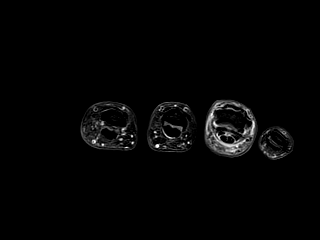
[im 36/50]
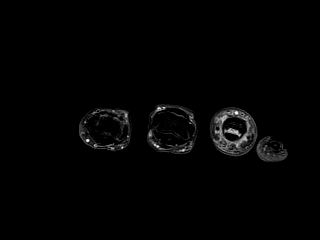
[im 41/50]
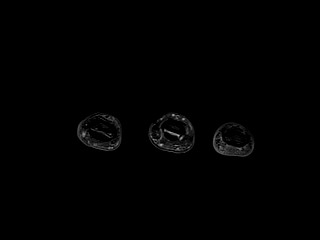
[im 45/50]
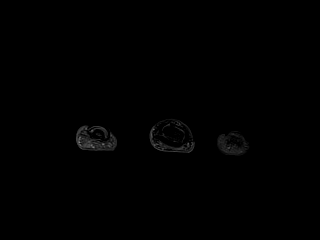
[im 50/50]
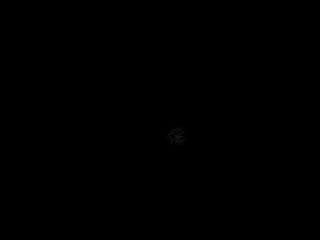

[Series 8: T1 · coronal · right · 3.0mm · 0.44mm/px · 4 of 15 slices shown (1 of 2)]
[im 1/15]
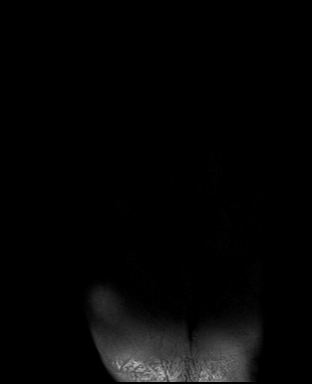
[im 5/15]
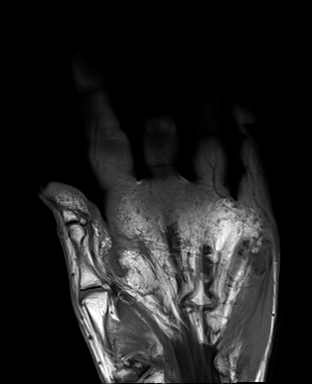
[im 10/15]
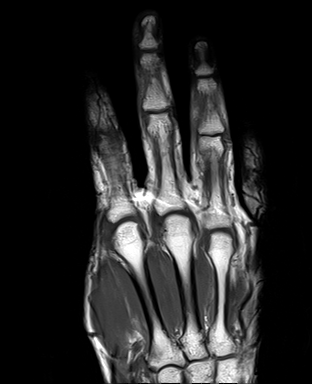
[im 15/15]
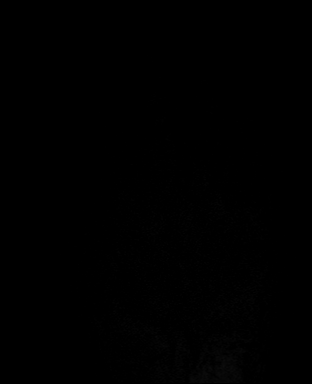

[Series 9: T2 fat-sat · coronal · right · 3.0mm · 0.44mm/px · 4 of 15 slices shown (2 of 3)]
[im 1/15]
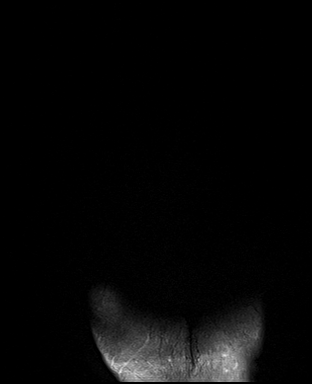
[im 5/15]
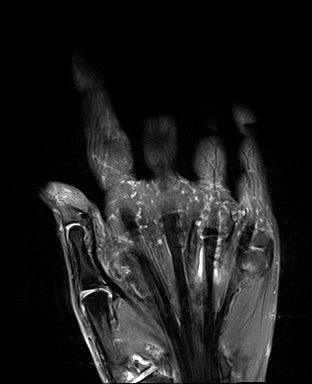
[im 10/15]
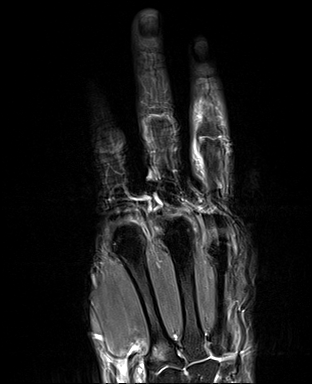
[im 15/15]
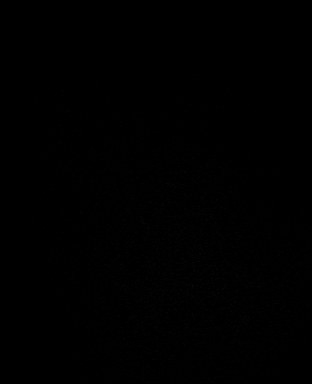

[Series 10: PD fat-sat · coronal · right · 3.0mm · 0.44mm/px · 4 of 15 slices shown]
[im 1/15]
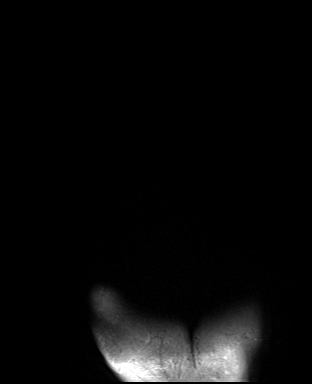
[im 5/15]
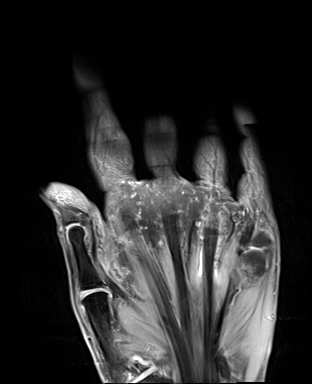
[im 10/15]
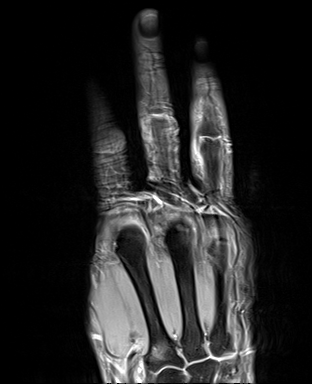
[im 15/15]
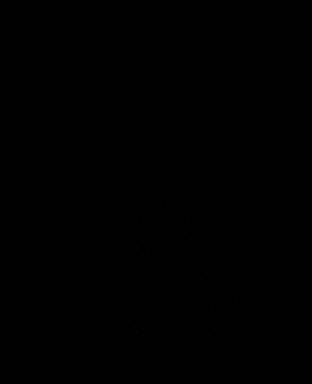

[Series 11: T2 fat-sat · sagittal · right · 3.0mm · 0.53mm/px · 8 of 32 slices shown (3 of 3)]
[im 1/32]
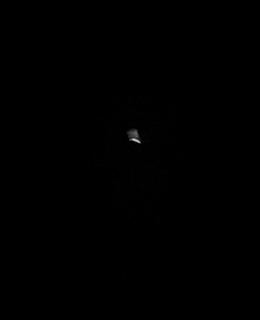
[im 5/32]
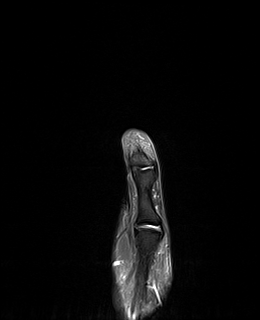
[im 9/32]
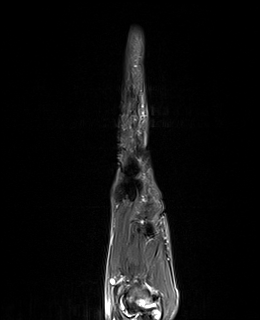
[im 14/32]
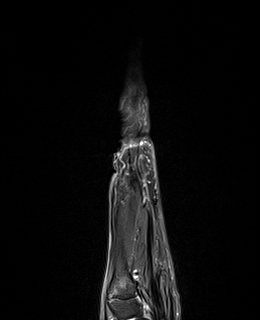
[im 18/32]
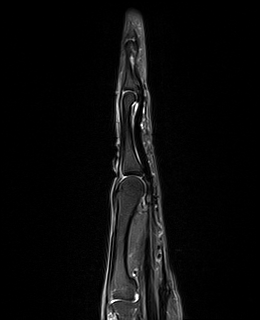
[im 23/32]
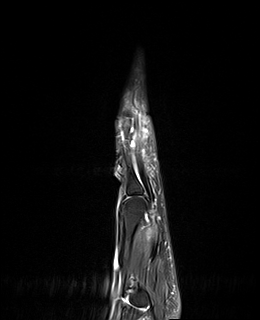
[im 27/32]
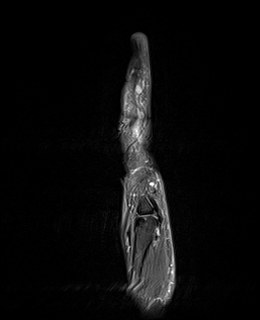
[im 32/32]
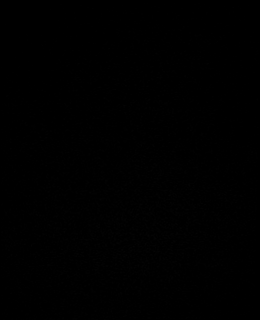

[Series 12: T1 · sagittal · right · 3.0mm · 0.53mm/px · 8 of 33 slices shown (2 of 2)]
[im 1/33]
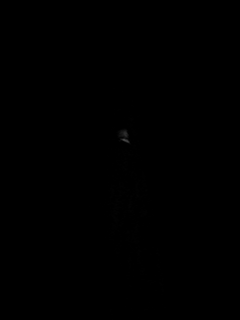
[im 5/33]
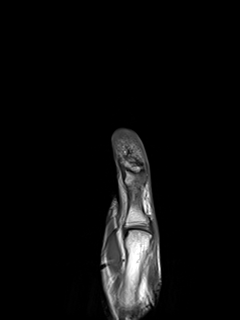
[im 10/33]
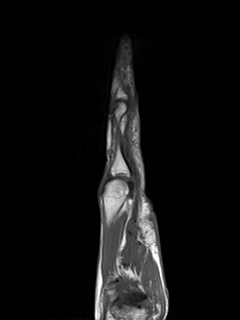
[im 14/33]
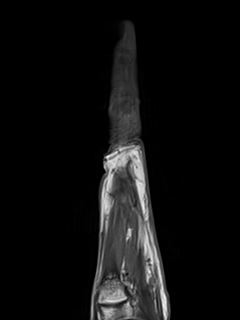
[im 19/33]
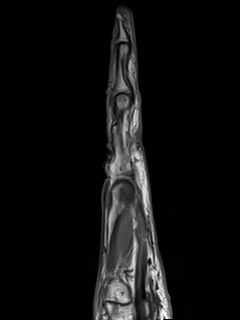
[im 23/33]
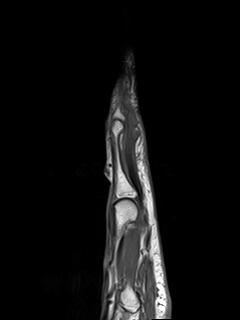
[im 28/33]
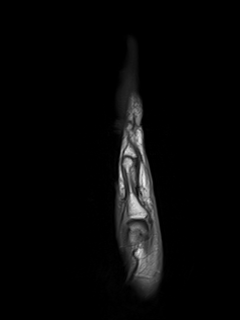
[im 33/33]
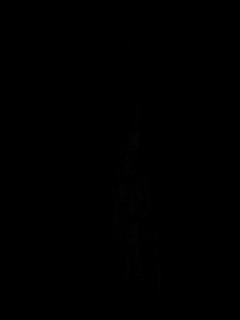

[40 of 40 positions shown; findings below may reference images not displayed]

FINDINGS: There is diffuse and fairly significant tenosynovitis and mild
tendinopathy involving the flexor tendon of the ring finger. No
tendon rupture or longitudinal tear. The finger pulleys are intact.
The extensor tendon is intact. No joint effusions or significant
degenerative changes. The other fingers are unremarkable. The radial
and ulnar collateral ligaments appear normal.

Degenerative changes are noted at the carpometacarpal joint of thumb
and also at the second metacarpal trapezoid articulation. There are
joint effusions, spurring changes, subchondral cystic change and
marrow edema.
IMPRESSION: Diffuse and fairly significant tenosynovitis involving a flexor
tendon complex of the ring finger. There is also mild tendinopathy.
No tendon rupture.

Moderate to advanced degenerative changes involving the
carpometacarpal joint of the and also the second metacarpal
trapezoid articulation.

## 2017-11-19 DIAGNOSIS — M25511 Pain in right shoulder: Secondary | ICD-10-CM | POA: Insufficient documentation

## 2017-11-19 DIAGNOSIS — M25462 Effusion, left knee: Secondary | ICD-10-CM | POA: Diagnosis not present

## 2017-11-19 DIAGNOSIS — M7541 Impingement syndrome of right shoulder: Secondary | ICD-10-CM | POA: Diagnosis not present

## 2017-11-19 DIAGNOSIS — M25711 Osteophyte, right shoulder: Secondary | ICD-10-CM | POA: Diagnosis not present

## 2017-11-19 DIAGNOSIS — M1712 Unilateral primary osteoarthritis, left knee: Secondary | ICD-10-CM | POA: Diagnosis not present

## 2017-11-19 DIAGNOSIS — G8929 Other chronic pain: Secondary | ICD-10-CM | POA: Diagnosis not present

## 2017-11-20 ENCOUNTER — Other Ambulatory Visit: Payer: Self-pay | Admitting: Orthopedic Surgery

## 2017-11-20 DIAGNOSIS — M25562 Pain in left knee: Secondary | ICD-10-CM

## 2017-11-27 ENCOUNTER — Ambulatory Visit
Admission: RE | Admit: 2017-11-27 | Discharge: 2017-11-27 | Disposition: A | Discharge status: Home / Self Care | Payer: PPO | Source: Ambulatory Visit | Attending: Orthopedic Surgery | Admitting: Orthopedic Surgery

## 2017-11-27 DIAGNOSIS — M25562 Pain in left knee: Secondary | ICD-10-CM | POA: Diagnosis not present

## 2017-12-02 ENCOUNTER — Other Ambulatory Visit (HOSPITAL_COMMUNITY): Payer: Self-pay | Admitting: Interventional Radiology

## 2017-12-02 ENCOUNTER — Other Ambulatory Visit: Payer: Self-pay | Admitting: Radiology

## 2017-12-02 DIAGNOSIS — D1771 Benign lipomatous neoplasm of kidney: Secondary | ICD-10-CM

## 2017-12-02 DIAGNOSIS — N2889 Other specified disorders of kidney and ureter: Secondary | ICD-10-CM

## 2017-12-15 DIAGNOSIS — M1712 Unilateral primary osteoarthritis, left knee: Secondary | ICD-10-CM | POA: Diagnosis not present

## 2017-12-15 DIAGNOSIS — S83232D Complex tear of medial meniscus, current injury, left knee, subsequent encounter: Secondary | ICD-10-CM | POA: Diagnosis not present

## 2017-12-15 DIAGNOSIS — S83272D Complex tear of lateral meniscus, current injury, left knee, subsequent encounter: Secondary | ICD-10-CM | POA: Diagnosis not present

## 2017-12-30 ENCOUNTER — Other Ambulatory Visit: Payer: Self-pay | Admitting: *Deleted

## 2017-12-30 ENCOUNTER — Other Ambulatory Visit: Payer: Self-pay | Admitting: Interventional Radiology

## 2017-12-30 DIAGNOSIS — D1771 Benign lipomatous neoplasm of kidney: Secondary | ICD-10-CM

## 2018-01-12 DIAGNOSIS — D1771 Benign lipomatous neoplasm of kidney: Secondary | ICD-10-CM | POA: Diagnosis not present

## 2018-01-12 LAB — BASIC METABOLIC PANEL WITH GFR
BUN/Creatinine Ratio: 47 (calc) — ABNORMAL HIGH (ref 6–22)
BUN: 20 mg/dL (ref 7–25)
CO2: 27 mmol/L (ref 20–32)
CREATININE: 0.43 mg/dL — AB (ref 0.60–0.93)
Calcium: 9.3 mg/dL (ref 8.6–10.4)
Chloride: 108 mmol/L (ref 98–110)
GFR, EST NON AFRICAN AMERICAN: 102 mL/min/{1.73_m2} (ref >=60)
GFR, Est African American: 119 mL/min/{1.73_m2} (ref >=60)
GLUCOSE: 89 mg/dL (ref 65–99)
Potassium: 4.1 mmol/L (ref 3.5–5.3)
SODIUM: 143 mmol/L (ref 135–146)

## 2018-01-18 ENCOUNTER — Ambulatory Visit (HOSPITAL_COMMUNITY)
Admission: RE | Admit: 2018-01-18 | Discharge: 2018-01-18 | Disposition: A | Discharge status: Home / Self Care | Payer: PPO | Source: Ambulatory Visit | Attending: Interventional Radiology | Admitting: Interventional Radiology

## 2018-01-18 DIAGNOSIS — D1771 Benign lipomatous neoplasm of kidney: Secondary | ICD-10-CM | POA: Insufficient documentation

## 2018-01-18 DIAGNOSIS — N2889 Other specified disorders of kidney and ureter: Secondary | ICD-10-CM | POA: Diagnosis present

## 2018-01-18 MED ORDER — IOPAMIDOL (ISOVUE-370) INJECTION 76%
100.0000 mL | Freq: Once | INTRAVENOUS | Status: AC | PRN
  Administered 2018-01-18: 100 mL via INTRAVENOUS

## 2018-01-18 MED ORDER — IOPAMIDOL (ISOVUE-370) INJECTION 76%
INTRAVENOUS | Status: AC
  Filled 2018-01-18: qty 100

## 2018-01-21 ENCOUNTER — Encounter: Payer: Self-pay | Admitting: Radiology

## 2018-01-21 ENCOUNTER — Ambulatory Visit
Admission: RE | Admit: 2018-01-21 | Discharge: 2018-01-21 | Disposition: A | Discharge status: Home / Self Care | Payer: PPO | Source: Ambulatory Visit | Attending: Interventional Radiology | Admitting: Interventional Radiology

## 2018-01-21 DIAGNOSIS — D3002 Benign neoplasm of left kidney: Secondary | ICD-10-CM | POA: Diagnosis not present

## 2018-01-21 DIAGNOSIS — D1771 Benign lipomatous neoplasm of kidney: Secondary | ICD-10-CM

## 2018-01-21 HISTORY — PX: IR RADIOLOGIST EVAL & MGMT: IMG5224

## 2018-01-21 NOTE — Progress Notes (Signed)
Chief Complaint: Patient was seen in follow up today for  Chief Complaint  Patient presents with  . Follow-up    8 mo follow up Embolization of Left Renal Angiomyolipoma   at the request of [facility]  Referring Physician(s): Dr. Denyce Robert  History of Present Illness: Ann Vang is a 72 y.o. 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  She underwent transarterial embolization via a left radial approach on 05/13/2017 and presents today for scheduled follow-up evaluation.  Clinically, [name] is doing exceptionally well.  She is asymptomatic.  She denies flank pain, hematuria, dysuria, fever, chills or other systemic symptoms.  Past Medical History:  Diagnosis Date  . Abnormal breast finding 12/1998   left breast abnl  . Arthritis    --basal joint--right hand  . Carpal tunnel syndrome, right   . Diverticulosis 2003   mild  . Lumbar disc disorder 2000   bulging  . Menopause   . Osteoporosis    Fx L4 age 89 water skiing accident  . Thyroid disease 10/2014   thyroid nodules--bx'd large nodule and was benign--sees Gen. surgeon with [facility]    Past Surgical History:  Procedure Laterality Date  . AUGMENTATION MAMMAPLASTY  1983   Implants have been removed  . BREAST SURGERY  2010   implants removed ruptured  . CERVICAL BIOPSY  W/ LOOP ELECTRODE EXCISION  1996   CIN  . IR EMBO TUMOR ORGAN ISCHEMIA INFARCT INC GUIDE ROADMAPPING  05/13/2017  . IR RADIOLOGIST EVAL & MGMT  04/15/2017  . IR RADIOLOGIST EVAL & MGMT  05/27/2017  . IR RADIOLOGIST EVAL & MGMT  01/21/2018  . IR RENAL SUPRASEL UNI S&I MOD SED  05/13/2017  . IR US GUIDE VASC ACCESS LEFT  05/13/2017  . KNEE SURGERY  2012   meniscus rt knee (both)  . L-2 fracture  2009   fall in aerobics  . L-4 fracture   water skiing    . TENOSYNOVECTOMY Right 03/11/2016   Procedure: TENOSYNOVECTOMY FLEXOR RIGHT RING FINGER;  Surgeon: Daryll Brod, MD;  Location: [facility];  Service: Orthopedics;  Laterality: Right;  . thyroid nodule biopsy  10/2014   --[facility], [place]--  . TONSILLECTOMY AND ADENOIDECTOMY  1964    Allergies: Patient has no known allergies.  Medications: Prior to Admission medications   Medication Sig Start Date End Date Taking? Authorizing Provider  Cholecalciferol (VITAMIN D) 2000 UNITS tablet Take 2,000 Units by mouth daily.   Yes [provider]  Estradiol Acetate ([place]) 0.05 MG/24HR RING INSERT 1 RING VAGINALLY EVERY THREE MONTHS 02/27/17  Yes Nunzio Cobbs, MD  fluticasone [facility]) 50 MCG/ACT nasal spray Place 2 sprays into the nose daily. 07/13/12  Yes HodginMora Appl, MD  progesterone (PROMETRIUM) 100 MG capsule Take 1 capsule (100 mg total) by mouth daily. 02/27/17  Yes Nunzio Cobbs, MD     Family History  Problem Relation Age of Onset  . Osteoporosis Mother   . Alzheimer's disease Mother   . Hypertension Father   . Heart attack Father   . Osteoporosis Maternal Grandmother   . Colon cancer Maternal Grandmother   . Colon cancer Other     Social History   Socioeconomic History  . Marital status: Married    Spouse name: Not on file  . Number of children: Not on file  . Years of education: Not on file  . Highest education level:  Not on file  Occupational History  . Not on file  Social Needs  . Financial resource strain: Not on file  . Food insecurity:    Worry: Not on file    Inability: Not on file  . Transportation needs:    Medical: Not on file    Non-medical: Not on file  Tobacco Use  . Smoking status: Former Smoker    Last attempt to quit: 12/21/1972    Years since quitting: 45.1  . Smokeless tobacco: Never Used  Substance and Sexual Activity  . Alcohol use: Yes    Alcohol/week: 8.4 oz    Types: 14 Standard drinks or equivalent per week    Comment: 2 glasses of wine at night  . Drug use: No  . Sexual activity: Yes    Partners: Male    Birth  control/protection: Post-menopausal  Lifestyle  . Physical activity:    Days per week: Not on file    Minutes per session: Not on file  . Stress: Not on file  Relationships  . Social connections:    Talks on phone: Not on file    Gets together: Not on file    Attends religious service: Not on file    Active member of club or organization: Not on file    Attends meetings of clubs or organizations: Not on file    Relationship status: Not on file  Other Topics Concern  . Not on file  Social History Narrative  . Not on file    Review of Systems: A 12 point ROS discussed and pertinent positives are indicated in the HPI above.  All other systems are negative.  Review of Systems  Vital Signs: BP (!) 168/77   Pulse 76   Temp 98.1 F (36.7 C) (Oral)   Resp 14   Ht 5\' 7"  (1.702 m)   Wt 158 lb (71.7 kg)   LMP 08/18/1994 (Approximate)   SpO2 100%   BMI 24.75 kg/m   Physical Exam  Constitutional: She is oriented to person, place, and time. She appears well-developed and well-nourished. No distress.  HENT:  Head: Normocephalic and atraumatic.  Eyes: No scleral icterus.  Cardiovascular: Normal rate.  Pulmonary/Chest: Effort normal.  Abdominal: Soft. She exhibits no distension. There is no tenderness.  Neurological: She is alert and oriented to person, place, and time.  Skin: Skin is warm and dry.  Psychiatric: She has a normal mood and affect. Her behavior is normal.  Nursing note and vitals reviewed.   Mallampati Score:     Imaging: Ct Angio Abdomen W &/or Wo Contrast  Result Date: 01/18/2018 CLINICAL DATA:  Half follow-up coil and liquid embolization hypervascular large LEFT renal angiomyolipoma. Embolization on 05/13/2017. EXAM: CT ANGIOGRAPHY ABDOMEN TECHNIQUE: Multidetector CT imaging of the abdomen was performed using the standard protocol during bolus administration of intravenous contrast. Multiplanar reconstructed images and MIPs were obtained and reviewed to  evaluate the vascular anatomy. CONTRAST:  118mL ISOVUE-370 IOPAMIDOL (ISOVUE-370) INJECTION 76% COMPARISON:  CT 03/30/2017, angiography and embolization 05/13/2017 FINDINGS: VASCULAR normal caliber. Aorta: Normal caliber.  No significant vascular calcification Celiac: Widely patent SMA: Widely patent Renals: A single renal arteries are widely patent. IMA: Patent Inflow: Normal Veins: Normal Review of the MIP images confirms the above findings. NON-VASCULAR Lower chest: Lung bases are clear Hepatobiliary: Several small nonenhancing cysts in the liver. Gallbladder normal Pancreas: Normal pancreas Spleen: Normal spleen Adrenals/Urinary Tract: Adrenal glands are normal. Renal: Significant reduction in size of the exophytic LEFT renal  mass which measures 4.3 x 3.1 x 5.3 cm (volume = 37 cm^3) compared with 7.1 x 4.3 7.5 cm. (Volume = 120 cm^3). The inferior portion of the mass has dense lipiodol staining with no clear evidence of enhancement. This portion is rounded and contracted. The more superior portion of the mass has soft tissue density and does demonstrate persistent enhancement (image 33/4.) No new lesions LEFT or RIGHT kidney. Kidneys both enhance symmetrically. Stomach/Bowel: Stomach limited view of the bowel is unremarkable. Lymphatic: No retroperitoneal lymphadenopathy Other: Musculoskeletal: No aggressive osseous lesion IMPRESSION: VASCULAR No vascular complication NON-VASCULAR 1. Marked decrease in volume of the large LEFT renal angiomyolipoma. The inferior portion the mass has dense lipiodol staining with significant contraction is and no clear enhancement. The more superior portion of the mass lesion has post-contrast soft tissue enhancement with some reduction in size. 2. Normal renal parenchymal enhancement in LEFT and RIGHT kidney otherwise. Electronically Signed   By: Suzy Bouchard M.D.   On: 01/18/2018 14:30   Ir Radiologist Eval & Mgmt  Result Date: 01/21/2018 Please refer to notes tab for  details about interventional procedure. (Op Note)   Labs:  CBC: Recent Labs    05/13/17 1144  WBC 5.5  HGB 14.2  HCT 41.2  PLT 203    COAGS: Recent Labs    05/13/17 1144  INR 0.93    BMP: Recent Labs    05/13/17 1144 05/14/17 0430 01/12/18 0733  NA 140 137 143  K 4.0 3.8 4.1  CL 108 107 108  CO2 22 20* 27  GLUCOSE 102* 99 89  BUN 19 13 20   CALCIUM 9.2 8.5* 9.3  CREATININE 0.53 0.47 0.43*  GFRNONAA >60 >60 102  GFRAA >60 >60 119    LIVER FUNCTION TESTS: No results for input(s): BILITOT, AST, ALT, ALKPHOS, PROT, ALBUMIN in the last 8760 hours.  TUMOR MARKERS: No results for input(s): AFPTM, CEA, CA199, CHROMGRNA in the last 8760 hours.  Assessment and Plan:  Clinically doing very well and without complaint.  Follow-up CT arteriogram today demonstrates a significant overall reduction in the size of the left renal angiomyolipoma.  The vast majority of the mass is now successfully devascularized and atrophic.  However, there is a rim of persistent enhancing tissue along the superior and medial margin of the AML.  Even in retrospect, I can detect no tumor blush in this region on the prior renal angiogram.  This indicates possible variant arterial supply, perhaps from the splenic artery.  We discussed the options including surveillance to assess for growth of this persistent portion of the AML over time versus pursuing repeat angiography and treatment of the variant blood supply now.  Mrs. Fern feels very comfortable with surveillance.  We will proceed with a repeat CT arteriogram in 6 months.  1.)  Repeat CT arteriogram of the abdomen with and without contrast to assess the left renal angiomyolipoma in 6 months with return clinic visit.  Please also obtain BUN and creatinine.   Electronically Signed: Jacqulynn Cadet 01/21/2018, 3:30 PM   I spent a total of   15 Minutes in face to face in clinical consultation, greater than 50% of which was  counseling/coordinating care for left renal AML

## 2018-02-04 DIAGNOSIS — M25562 Pain in left knee: Secondary | ICD-10-CM | POA: Diagnosis not present

## 2018-02-23 ENCOUNTER — Other Ambulatory Visit: Payer: Self-pay | Admitting: Obstetrics and Gynecology

## 2018-02-23 NOTE — Telephone Encounter (Signed)
Medication refill request: Progestrone 100mg  Capsul  Last AEX:  02/27/17 Next AEX: 03/01/18 Last MMG (if hormonal medication request): 05/27/17 Bi-rads Category 1 neg  Refill authorized: please refill if appropriate.

## 2018-03-01 ENCOUNTER — Encounter: Payer: Self-pay | Admitting: Obstetrics and Gynecology

## 2018-03-01 ENCOUNTER — Ambulatory Visit (INDEPENDENT_AMBULATORY_CARE_PROVIDER_SITE_OTHER): Payer: PPO | Admitting: Obstetrics and Gynecology

## 2018-03-01 ENCOUNTER — Other Ambulatory Visit: Payer: Self-pay

## 2018-03-01 VITALS — BP 128/70 | HR 76 | Resp 16 | Ht 67.0 in | Wt 162.0 lb

## 2018-03-01 DIAGNOSIS — Z01419 Encounter for gynecological examination (general) (routine) without abnormal findings: Secondary | ICD-10-CM | POA: Diagnosis not present

## 2018-03-01 MED ORDER — PROGESTERONE MICRONIZED 100 MG PO CAPS: 100.0000 mg | ORAL_CAPSULE | Freq: Every day | ORAL | 3 refills | Status: DC

## 2018-03-01 MED ORDER — ESTRADIOL 0.05 MG/24HR TD PTTW: 1.0000 | MEDICATED_PATCH | TRANSDERMAL | 0 refills | Status: DC

## 2018-03-01 NOTE — Progress Notes (Signed)
72 y.o. G0P0 Married Caucasian female here for annual exam.    Using Femring and Prometrium.  Would like to change to another form of estrogen.   Will be building a home.   PCP: Dr. Ardeth Perfect    Patient's last menstrual period was 08/18/1994 (approximate).           Sexually active: Yes.    The current method of family planning is post menopausal status.    Exercising: Yes.    weights, pilates, cardio Smoker:  no  Health Maintenance: Pap:  02-27-17 Neg, 02-05-15 Neg History of abnormal Pap:  no MMG:  05/27/17 BIRADS 1 negative/density c Colonoscopy:  01/11/14 Mild Diverticulitis-repeat 10 yrs BMD:   05/07/16  Result  Osteopenia.  Hx prior osteoporosis.  Used Fosamax in the past.  TDaP: 03/16/08 Gardasil:   n/a HIV: blood donor in the past Hep C: blood donor in the past Screening Labs: PCP   reports that she quit smoking about 45 years ago. She has never used smokeless tobacco. She reports that she drinks about 8.4 oz of alcohol per week. She reports that she does not use drugs.  Past Medical History:  Diagnosis Date  . Abnormal breast finding 12/1998   left breast abnl  . Arthritis    --basal joint--right hand  . Carpal tunnel syndrome, right   . Diverticulosis 2003   mild  . Lumbar disc disorder 2000   bulging  . Menopause   . Osteoporosis    Fx L4 age 69 water skiing accident  . Thyroid disease 10/2014   thyroid nodules--bx'd large nodule and was benign--sees Gen. surgeon with [facility]    Past Surgical History:  Procedure Laterality Date  . AUGMENTATION MAMMAPLASTY  1983   Implants have been removed  . BREAST SURGERY  2010   implants removed ruptured  . CERVICAL BIOPSY  W/ LOOP ELECTRODE EXCISION  1996   CIN  . IR EMBO TUMOR ORGAN ISCHEMIA INFARCT INC GUIDE ROADMAPPING  05/13/2017  . IR RADIOLOGIST EVAL & MGMT  04/15/2017  . IR RADIOLOGIST EVAL & MGMT  05/27/2017  . IR RADIOLOGIST EVAL & MGMT  01/21/2018  . IR RENAL SUPRASEL UNI S&I MOD SED  05/13/2017  . IR US  GUIDE VASC ACCESS LEFT  05/13/2017  . KNEE SURGERY  2012   meniscus rt knee (both)  . L-2 fracture  2009   fall in aerobics  . L-4 fracture   water skiing    . TENOSYNOVECTOMY Right 03/11/2016   Procedure: TENOSYNOVECTOMY FLEXOR RIGHT RING FINGER;  Surgeon: Daryll Brod, MD;  Location: [facility];  Service: Orthopedics;  Laterality: Right;  . thyroid nodule biopsy  10/2014   --[facility], [place]--  . TONSILLECTOMY AND ADENOIDECTOMY  1964    Current Outpatient Medications  Medication Sig Dispense Refill  . Cholecalciferol (VITAMIN D) 2000 UNITS tablet Take 2,000 Units by mouth daily.    . Estradiol Acetate (FEMRING) 0.05 MG/24HR RING INSERT 1 RING VAGINALLY EVERY THREE MONTHS 1 each 3  . fluticasone (FLONASE) 50 MCG/ACT nasal spray Place 2 sprays into the nose daily. 48 g 1  . progesterone (PROMETRIUM) 100 MG capsule TAKE 1 CAPSULE (100 MG TOTAL) BY MOUTH DAILY. 30 capsule 0   No current facility-administered medications for this visit.     Family History  Problem Relation Age of Onset  . Osteoporosis Mother   . Alzheimer's disease Mother   . Hypertension Father   . Heart attack Father   . Osteoporosis  Maternal Grandmother   . Colon cancer Maternal Grandmother   . Colon cancer Other     Review of Systems  Constitutional: Negative.   HENT: Negative.   Eyes: Negative.   Respiratory: Negative.   Cardiovascular: Negative.   Gastrointestinal: Negative.   Endocrine: Negative.   Genitourinary: Negative.   Musculoskeletal: Negative.   Skin: Negative.   Allergic/Immunologic: Negative.   Neurological: Negative.   Hematological: Negative.   Psychiatric/Behavioral: Negative.     Exam:   BP 128/70 (BP Location: Right Arm, Patient Position: Sitting, Cuff Size: Normal)   Pulse 76   Resp 16   Ht 5\' 7"  (1.702 m)   Wt 162 lb (73.5 kg)   LMP 08/18/1994 (Approximate)   BMI 25.37 kg/m     General appearance: alert, cooperative and appears stated age Head:  Normocephalic, without obvious abnormality, atraumatic Neck: no adenopathy, supple, symmetrical, trachea midline and thyroid normal to inspection and palpation Lungs: clear to auscultation bilaterally Breasts: normal appearance, no masses or tenderness, No nipple retraction or dimpling, No nipple discharge or bleeding, No axillary or supraclavicular adenopathy.  Left chest wall with 4.5 cm somewhat fixed lipoma (long standing).  Heart: regular rate and rhythm Abdomen: soft, non-tender; no masses, no organomegaly Extremities: extremities normal, atraumatic, no cyanosis or edema Skin: Skin color, texture, turgor normal. No rashes or lesions Lymph nodes: Cervical, supraclavicular, and axillary nodes normal. No abnormal inguinal nodes palpated Neurologic: Grossly normal  Pelvic: External genitalia:  no lesions              Urethra:  normal appearing urethra with no masses, tenderness or lesions              Bartholins and Skenes: normal                 Vagina: normal appearing vagina with normal color and discharge, no lesions              Cervix: no lesions              Pap taken: No. Bimanual Exam:  Uterus:  normal size, contour, position, consistency, mobility, non-tender              Adnexa: no mass, fullness, tenderness              Rectal exam: Yes.  .  Confirms.              Anus:  normal sphincter tone, no lesions  Chaperone was present for exam.  Assessment:   Well woman visit with normal exam. Osteopenia of hip.  Hx spinal fracture.  Hx left chest wall lipoma.  Hx angiolipoma of left kidney.   Plan: Mammogram screening. Recommended self breast awareness. Pap and HR HPV as above. Guidelines for Calcium, Vitamin D, regular exercise program including cardiovascular and weight bearing exercise. Refill of Prometrium 100 mg daily and change to Vivelle dot 0.05 mg twice weekly.  I discussed WHI and risk of breast ca, DVT, PE, and stroke.  We discussed the possibility of vaginal  bleeding with the change in formulation change.   She will call if this occurs.  Will do BMD this year at time of mammogram. Follow up annually and prn.   After visit summary provided.

## 2018-03-01 NOTE — Patient Instructions (Signed)

## 2018-03-04 ENCOUNTER — Ambulatory Visit: Payer: PPO | Admitting: Obstetrics and Gynecology

## 2018-03-11 ENCOUNTER — Telehealth: Payer: Self-pay | Admitting: Obstetrics and Gynecology

## 2018-03-11 MED ORDER — ESTRADIOL 0.05 MG/24HR TD PTTW: 1.0000 | MEDICATED_PATCH | TRANSDERMAL | 2 refills | Status: DC

## 2018-03-11 NOTE — Telephone Encounter (Signed)
Patient is questioning why her estradiol patch was only refilled until December 2019? Patient's next aex is not until 03/18/2019.

## 2018-03-11 NOTE — Telephone Encounter (Signed)
Spoke with patient. Changed from femring to estradiol patch 03/01/18, 3 mo supply sent, no refills, patient asking why?   Advised patient it is a med change, may need f/u or update for refills. MMG also due in 05/2018. Will review refills with Dr. Quincy Simmonds and return call, patient agreeable.   Dr. Quincy Simmonds -please advise on refills for estradiol patch?

## 2018-03-11 NOTE — Telephone Encounter (Signed)
Spoke with patient, advised as seen below per Dr. Quincy Simmonds.   New RX placed for Estradiol 0.05 patch #24/2RF.   Patient verbalizes understanding and is agreeable. Encounter closed.

## 2018-03-11 NOTE — Telephone Encounter (Signed)
Not done intentionally.  She can have refills on her transdermal estrogen for a full year.  My apologies.

## 2018-03-13 ENCOUNTER — Other Ambulatory Visit: Payer: Self-pay | Admitting: Internal Medicine

## 2018-03-13 DIAGNOSIS — R918 Other nonspecific abnormal finding of lung field: Secondary | ICD-10-CM

## 2018-03-19 ENCOUNTER — Other Ambulatory Visit: Payer: Self-pay | Admitting: Obstetrics and Gynecology

## 2018-03-26 ENCOUNTER — Ambulatory Visit
Admission: RE | Admit: 2018-03-26 | Discharge: 2018-03-26 | Disposition: A | Discharge status: Home / Self Care | Payer: PPO | Source: Ambulatory Visit | Attending: Internal Medicine | Admitting: Internal Medicine

## 2018-03-26 DIAGNOSIS — R918 Other nonspecific abnormal finding of lung field: Secondary | ICD-10-CM | POA: Diagnosis not present

## 2018-04-13 ENCOUNTER — Other Ambulatory Visit: Payer: Self-pay | Admitting: Internal Medicine

## 2018-04-13 DIAGNOSIS — Z1231 Encounter for screening mammogram for malignant neoplasm of breast: Secondary | ICD-10-CM

## 2018-05-07 ENCOUNTER — Ambulatory Visit: Payer: PPO | Admitting: Obstetrics & Gynecology

## 2018-05-07 ENCOUNTER — Encounter

## 2018-05-10 ENCOUNTER — Encounter: Payer: Self-pay | Admitting: Obstetrics and Gynecology

## 2018-05-10 DIAGNOSIS — M8589 Other specified disorders of bone density and structure, multiple sites: Secondary | ICD-10-CM | POA: Diagnosis not present

## 2018-05-10 DIAGNOSIS — Z780 Asymptomatic menopausal state: Secondary | ICD-10-CM | POA: Diagnosis not present

## 2018-05-23 ENCOUNTER — Telehealth: Payer: Self-pay | Admitting: Obstetrics and Gynecology

## 2018-05-23 NOTE — Telephone Encounter (Signed)
Please contact patient with results of her BMD from [facility] for 05/10/18. She has osteopenia of the hips and her wrist is normal.  The left hip has lost bone mass and the right hip and radius are stable.  (Please note that a new machine was used to do this study compared to 2017.)  The risk of fracture is not considered high enough to proceed with pharmacologic treatment.   1200 mg of calcium daily in divided doses, 2000 IU of vitamin D daily, and weight bearing exercise will help to maintain bone density and strength.  Her next bone density is due in 2 years.

## 2018-05-24 NOTE — Telephone Encounter (Signed)
Message left to return call to Emily at [phone].    

## 2018-05-24 NOTE — Telephone Encounter (Signed)
Patient returned call. BMD results reviewed with patient and she verbalized understanding. Patient states that she was only able to do the bicycle for a while due to knee problems, but is transitioning back to the elliptical and doing weights 3x a week. Aware to incorporate calcium and vitamin d into daily regimen.   Encounter closed.

## 2018-05-28 ENCOUNTER — Ambulatory Visit
Admission: RE | Admit: 2018-05-28 | Discharge: 2018-05-28 | Disposition: A | Discharge status: Home / Self Care | Payer: PPO | Source: Ambulatory Visit | Attending: Internal Medicine | Admitting: Internal Medicine

## 2018-05-28 DIAGNOSIS — Z1231 Encounter for screening mammogram for malignant neoplasm of breast: Secondary | ICD-10-CM

## 2018-06-02 DIAGNOSIS — M810 Age-related osteoporosis without current pathological fracture: Secondary | ICD-10-CM | POA: Diagnosis not present

## 2018-06-02 DIAGNOSIS — E041 Nontoxic single thyroid nodule: Secondary | ICD-10-CM | POA: Diagnosis not present

## 2018-06-02 DIAGNOSIS — Z79899 Other long term (current) drug therapy: Secondary | ICD-10-CM | POA: Diagnosis not present

## 2018-06-02 DIAGNOSIS — R82998 Other abnormal findings in urine: Secondary | ICD-10-CM | POA: Diagnosis not present

## 2018-06-08 DIAGNOSIS — M859 Disorder of bone density and structure, unspecified: Secondary | ICD-10-CM | POA: Diagnosis not present

## 2018-06-08 DIAGNOSIS — Z6825 Body mass index (BMI) 25.0-25.9, adult: Secondary | ICD-10-CM | POA: Diagnosis not present

## 2018-06-08 DIAGNOSIS — D6489 Other specified anemias: Secondary | ICD-10-CM | POA: Diagnosis not present

## 2018-06-08 DIAGNOSIS — D3000 Benign neoplasm of unspecified kidney: Secondary | ICD-10-CM | POA: Diagnosis not present

## 2018-06-08 DIAGNOSIS — Z23 Encounter for immunization: Secondary | ICD-10-CM | POA: Diagnosis not present

## 2018-06-08 DIAGNOSIS — Z1389 Encounter for screening for other disorder: Secondary | ICD-10-CM | POA: Diagnosis not present

## 2018-06-08 DIAGNOSIS — E041 Nontoxic single thyroid nodule: Secondary | ICD-10-CM | POA: Diagnosis not present

## 2018-06-08 DIAGNOSIS — Z0000 Encounter for general adult medical examination without abnormal findings: Secondary | ICD-10-CM | POA: Diagnosis not present

## 2018-06-08 DIAGNOSIS — R918 Other nonspecific abnormal finding of lung field: Secondary | ICD-10-CM | POA: Diagnosis not present

## 2018-07-02 DIAGNOSIS — Z23 Encounter for immunization: Secondary | ICD-10-CM | POA: Diagnosis not present

## 2018-07-02 DIAGNOSIS — D225 Melanocytic nevi of trunk: Secondary | ICD-10-CM | POA: Diagnosis not present

## 2018-07-02 DIAGNOSIS — D224 Melanocytic nevi of scalp and neck: Secondary | ICD-10-CM | POA: Diagnosis not present

## 2018-07-02 DIAGNOSIS — L570 Actinic keratosis: Secondary | ICD-10-CM | POA: Diagnosis not present

## 2018-07-02 DIAGNOSIS — L821 Other seborrheic keratosis: Secondary | ICD-10-CM | POA: Diagnosis not present

## 2018-07-02 DIAGNOSIS — Z86018 Personal history of other benign neoplasm: Secondary | ICD-10-CM | POA: Diagnosis not present

## 2018-07-20 DIAGNOSIS — D6489 Other specified anemias: Secondary | ICD-10-CM | POA: Diagnosis not present

## 2018-10-08 DIAGNOSIS — H2513 Age-related nuclear cataract, bilateral: Secondary | ICD-10-CM | POA: Diagnosis not present

## 2018-10-08 DIAGNOSIS — H35372 Puckering of macula, left eye: Secondary | ICD-10-CM | POA: Diagnosis not present

## 2018-10-08 DIAGNOSIS — D3131 Benign neoplasm of right choroid: Secondary | ICD-10-CM | POA: Diagnosis not present

## 2018-12-21 DIAGNOSIS — M545 Low back pain, unspecified: Secondary | ICD-10-CM | POA: Insufficient documentation

## 2018-12-22 DIAGNOSIS — S37012A Minor contusion of left kidney, initial encounter: Secondary | ICD-10-CM | POA: Insufficient documentation

## 2018-12-22 DIAGNOSIS — N2889 Other specified disorders of kidney and ureter: Secondary | ICD-10-CM | POA: Insufficient documentation

## 2018-12-22 DIAGNOSIS — M4856XA Collapsed vertebra, not elsewhere classified, lumbar region, initial encounter for fracture: Secondary | ICD-10-CM | POA: Diagnosis not present

## 2018-12-22 DIAGNOSIS — M545 Low back pain: Secondary | ICD-10-CM | POA: Diagnosis not present

## 2018-12-23 ENCOUNTER — Other Ambulatory Visit: Payer: Self-pay | Admitting: Sports Medicine

## 2018-12-23 DIAGNOSIS — N2889 Other specified disorders of kidney and ureter: Secondary | ICD-10-CM

## 2018-12-27 ENCOUNTER — Other Ambulatory Visit: Payer: Self-pay | Admitting: Internal Medicine

## 2018-12-27 ENCOUNTER — Other Ambulatory Visit: Payer: Self-pay

## 2018-12-27 ENCOUNTER — Ambulatory Visit
Admission: RE | Admit: 2018-12-27 | Discharge: 2018-12-27 | Disposition: A | Discharge status: Home / Self Care | Payer: PPO | Source: Ambulatory Visit | Attending: Sports Medicine | Admitting: Sports Medicine

## 2018-12-27 DIAGNOSIS — R911 Solitary pulmonary nodule: Secondary | ICD-10-CM | POA: Diagnosis not present

## 2018-12-27 DIAGNOSIS — N2889 Other specified disorders of kidney and ureter: Secondary | ICD-10-CM

## 2018-12-27 MED ORDER — IOPAMIDOL (ISOVUE-300) INJECTION 61%
100.0000 mL | Freq: Once | INTRAVENOUS | Status: AC | PRN
  Administered 2018-12-27: 100 mL via INTRAVENOUS

## 2018-12-28 ENCOUNTER — Other Ambulatory Visit: Payer: Self-pay | Admitting: Internal Medicine

## 2018-12-28 DIAGNOSIS — R911 Solitary pulmonary nodule: Secondary | ICD-10-CM

## 2019-02-02 ENCOUNTER — Telehealth: Payer: Self-pay | Admitting: Obstetrics and Gynecology

## 2019-02-02 NOTE — Telephone Encounter (Signed)
Left message on voicemail to call and reschedule cancelled appointment. °

## 2019-02-22 ENCOUNTER — Other Ambulatory Visit: Payer: Self-pay | Admitting: Obstetrics & Gynecology

## 2019-02-23 NOTE — Telephone Encounter (Signed)
Medication refill request: [email or web address]  Last AEX:  03-01-18 BS Next AEX: 05-10-2019 Last MMG (if hormonal medication request): 05-28-18 density C/BIRADS 1 negative  Refill authorized: Today, please advise. Medication pended for #8, 2RF. Please refill if appropriate.   Medication refill request: progesterone  Refill authorized: Today, please advise. Medication pended for #90, 0RF. Please refill if appropriate.

## 2019-03-18 ENCOUNTER — Ambulatory Visit: Payer: PPO | Admitting: Obstetrics and Gynecology

## 2019-04-08 DIAGNOSIS — Z23 Encounter for immunization: Secondary | ICD-10-CM | POA: Diagnosis not present

## 2019-05-06 ENCOUNTER — Other Ambulatory Visit: Payer: Self-pay

## 2019-05-10 ENCOUNTER — Ambulatory Visit (INDEPENDENT_AMBULATORY_CARE_PROVIDER_SITE_OTHER): Payer: PPO | Admitting: Obstetrics & Gynecology

## 2019-05-10 ENCOUNTER — Emergency Department (HOSPITAL_BASED_OUTPATIENT_CLINIC_OR_DEPARTMENT_OTHER)
Admission: EM | Admit: 2019-05-10 | Discharge: 2019-05-10 | Disposition: A | Discharge status: Home / Self Care | Payer: PPO | Attending: Emergency Medicine | Admitting: Emergency Medicine

## 2019-05-10 ENCOUNTER — Encounter: Payer: Self-pay | Admitting: Obstetrics & Gynecology

## 2019-05-10 ENCOUNTER — Other Ambulatory Visit: Payer: Self-pay

## 2019-05-10 ENCOUNTER — Emergency Department (HOSPITAL_BASED_OUTPATIENT_CLINIC_OR_DEPARTMENT_OTHER): Payer: PPO

## 2019-05-10 ENCOUNTER — Encounter (HOSPITAL_BASED_OUTPATIENT_CLINIC_OR_DEPARTMENT_OTHER): Payer: Self-pay | Admitting: Adult Health

## 2019-05-10 VITALS — BP 160/80 | HR 116 | Temp 98.2°F | Ht 66.0 in | Wt 157.8 lb

## 2019-05-10 DIAGNOSIS — R002 Palpitations: Secondary | ICD-10-CM | POA: Insufficient documentation

## 2019-05-10 DIAGNOSIS — S22020A Wedge compression fracture of second thoracic vertebra, initial encounter for closed fracture: Secondary | ICD-10-CM

## 2019-05-10 DIAGNOSIS — R000 Tachycardia, unspecified: Secondary | ICD-10-CM | POA: Diagnosis not present

## 2019-05-10 DIAGNOSIS — Z87891 Personal history of nicotine dependence: Secondary | ICD-10-CM | POA: Insufficient documentation

## 2019-05-10 DIAGNOSIS — Z79899 Other long term (current) drug therapy: Secondary | ICD-10-CM | POA: Diagnosis not present

## 2019-05-10 DIAGNOSIS — Z01419 Encounter for gynecological examination (general) (routine) without abnormal findings: Secondary | ICD-10-CM | POA: Diagnosis not present

## 2019-05-10 DIAGNOSIS — Z87898 Personal history of other specified conditions: Secondary | ICD-10-CM

## 2019-05-10 HISTORY — DX: Wedge compression fracture of second thoracic vertebra, initial encounter for closed fracture: S22.020A

## 2019-05-10 LAB — CBC
HCT: 42.5 % (ref 36.0–46.0)
Hemoglobin: 14.1 g/dL (ref 12.0–15.0)
MCH: 30.7 pg (ref 26.0–34.0)
MCHC: 33.2 g/dL (ref 30.0–36.0)
MCV: 92.6 fL (ref 80.0–100.0)
Platelets: 213 10*3/uL (ref 150–400)
RBC: 4.59 MIL/uL (ref 3.87–5.11)
RDW: 12.1 % (ref 11.5–15.5)
WBC: 6.8 10*3/uL (ref 4.0–10.5)
nRBC: 0 % (ref 0.0–0.2)

## 2019-05-10 LAB — BASIC METABOLIC PANEL
Anion gap: 13 (ref 5–15)
BUN: 26 mg/dL — ABNORMAL HIGH (ref 8–23)
CO2: 23 mmol/L (ref 22–32)
Calcium: 9 mg/dL (ref 8.9–10.3)
Chloride: 105 mmol/L (ref 98–111)
Creatinine, Ser: 0.53 mg/dL (ref 0.44–1.00)
GFR calc Af Amer: >60 mL/min (ref >60)
GFR calc non Af Amer: >60 mL/min (ref >60)
Glucose, Bld: 97 mg/dL (ref 70–99)
Potassium: 3.5 mmol/L (ref 3.5–5.1)
Sodium: 141 mmol/L (ref 135–145)

## 2019-05-10 NOTE — ED Provider Notes (Signed)
[facility] EMERGENCY DEPARTMENT Provider Note   CSN: [id number] Arrival date & time: 05/10/19  1708     History   Chief Complaint Chief Complaint  Patient presents with  . Irregular Heart Beat    HPI Ann Vang is a 73 y.o. female.     Patient sent in from GYN office during her annual exam.  Patient Ann Vang was having palpitations there.  The had no way to monitor her do an EKG.  Patient mention may be just some slight shortness of breath when the heart rate seem to be fast she feels fine now.  She played 18 holes of golf earlier today.  She does that frequently without any problems.  No known history of cardiac arrhythmia.  No associated chest pain no leg swelling.  No fevers no chest congestion.      Past Medical History:  Diagnosis Date  . Abnormal breast finding 12/1998   left breast abnl  . Arthritis    --basal joint--right hand  . Carpal tunnel syndrome, right   . Compression fracture of T2 vertebra ([place]) 05/10/2019  . Diverticulosis 2003   mild  . Lumbar compression fracture ([place]) 07/13/2012  . Lumbar disc disorder 2000   bulging  . Menopause   . Osteoporosis    Fx L4 age 47 water skiing accident  . Thyroid disease 10/2014   thyroid nodules--bx'd large nodule and was benign--sees Gen. surgeon with [facility]    Patient Active Problem List   Diagnosis Date Noted  . Compression fracture of T2 vertebra ([place]) 05/10/2019  . Renal mass 12/22/2018  . Low back pain 12/21/2018  . Acute pain of right shoulder 11/19/2017  . Angiomyolipoma of left kidney 05/13/2017  . Chronic pain of right knee 09/25/2016  . Flexor tenosynovitis of finger 03/14/2016  . Pain 02/20/2016  . Thyroid nodule 05/15/2015  . Lipoma of back 10/06/2014  . Lumbar compression fracture ([place]) 07/13/2012  . Ear pressure 07/13/2012  . DYSFUNCTION OF EUSTACHIAN TUBE 07/30/2010  . OSTEOPENIA 03/15/2010    Past Surgical History:  Procedure Laterality Date  . AUGMENTATION  MAMMAPLASTY  1983   Implants have been removed  . BREAST SURGERY  2010   implants removed ruptured  . CERVICAL BIOPSY  W/ LOOP ELECTRODE EXCISION  1996   CIN  . IR EMBO TUMOR ORGAN ISCHEMIA INFARCT INC GUIDE ROADMAPPING  05/13/2017  . IR RADIOLOGIST EVAL & MGMT  04/15/2017  . IR RADIOLOGIST EVAL & MGMT  05/27/2017  . IR RADIOLOGIST EVAL & MGMT  01/21/2018  . IR RENAL SUPRASEL UNI S&I MOD SED  05/13/2017  . IR US GUIDE VASC ACCESS LEFT  05/13/2017  . KNEE SURGERY  2012   meniscus rt knee (both)  . L-2 fracture  2009   fall in aerobics  . L-4 fracture   water skiing    . TENOSYNOVECTOMY Right 03/11/2016   Procedure: TENOSYNOVECTOMY FLEXOR RIGHT RING FINGER;  Surgeon: Daryll Brod, MD;  Location: [facility];  Service: Orthopedics;  Laterality: Right;  . thyroid nodule biopsy  10/2014   --[facility], [place]--  . TONSILLECTOMY AND ADENOIDECTOMY  1964     OB History    Gravida  0   Para      Term      Preterm      AB      Living        SAB      TAB      Ectopic  Multiple      Live Births               Home Medications    Prior to Admission medications   Medication Sig Start Date End Date Taking? Authorizing Provider  Cholecalciferol (VITAMIN D) 2000 UNITS tablet Take 2,000 Units by mouth daily.    [provider]  estradiol (VIVELLE-DOT) 0.05 MG/24HR patch PLACE ONE PATCH ONTO THE SKIN TWO TIMES A WEEK 02/23/19   Megan Salon, MD  fluticasone [facility]) 50 MCG/ACT nasal spray Place 2 sprays into the nose daily. 07/13/12   Burnice Logan, MD  progesterone (PROMETRIUM) 100 MG capsule TAKE ONE CAPSULE BY MOUTH DAILY 02/23/19   Megan Salon, MD    Family History Family History  Problem Relation Age of Onset  . Osteoporosis Mother   . Alzheimer's disease Mother   . Hypertension Father   . Heart attack Father   . Osteoporosis Maternal Grandmother   . Colon cancer Maternal Grandmother   . Colon cancer Other     Social History  Social History   Tobacco Use  . Smoking status: Former Smoker    Quit date: 12/21/1972    Years since quitting: 46.4  . Smokeless tobacco: Never Used  Substance Use Topics  . Alcohol use: Yes    Alcohol/week: 14.0 standard drinks    Types: 14 Glasses of wine per week  . Drug use: No     Allergies   Patient has no known allergies.   Review of Systems Review of Systems  Constitutional: Negative for chills and fever.  HENT: Negative for congestion, rhinorrhea and sore throat.   Eyes: Negative for visual disturbance.  Respiratory: Negative for cough and shortness of breath.   Cardiovascular: Positive for palpitations. Negative for chest pain and leg swelling.  Gastrointestinal: Negative for abdominal pain, diarrhea, nausea and vomiting.  Genitourinary: Negative for dysuria.  Musculoskeletal: Negative for back pain and neck pain.  Skin: Negative for rash.  Neurological: Negative for dizziness, light-headedness and headaches.  Hematological: Does not bruise/bleed easily.  Psychiatric/Behavioral: Negative for confusion.     Physical Exam Updated Vital Signs BP (!) 148/84   Pulse 93   Temp 99.2 F (37.3 C) (Oral)   Resp 15   LMP 08/18/1994 (Approximate)   SpO2 100%   Physical Exam Vitals signs and nursing note reviewed.  Constitutional:      General: She is not in acute distress.    Appearance: Normal appearance. She is well-developed.  HENT:     Head: Normocephalic and atraumatic.  Eyes:     Extraocular Movements: Extraocular movements intact.     Conjunctiva/sclera: Conjunctivae normal.     Pupils: Pupils are equal, round, and reactive to light.  Neck:     Musculoskeletal: Normal range of motion and neck supple.  Cardiovascular:     Rate and Rhythm: Normal rate and regular rhythm.     Heart sounds: No murmur.  Pulmonary:     Effort: Pulmonary effort is normal. No respiratory distress.     Breath sounds: Normal breath sounds.  Abdominal:     Palpations:  Abdomen is soft.     Tenderness: There is no abdominal tenderness.  Musculoskeletal:        General: No swelling.  Skin:    General: Skin is warm and dry.  Neurological:     General: No focal deficit present.     Mental Status: She is alert and oriented to person, place, and time.  ED Treatments / Results  Labs (all labs ordered are listed, but only abnormal results are displayed) Labs Reviewed  BASIC METABOLIC PANEL - Abnormal; Notable for the following components:      Result Value   BUN 26 (*)    All other components within normal limits  CBC    EKG EKG Interpretation  Date/Time:  Tuesday May 10 2019 17:16:03 EDT Ventricular Rate:  102 PR Interval:    QRS Duration: 104 QT Interval:  334 QTC Calculation: 435 R Axis:   42 Text Interpretation:  Sinus tachycardia LAE, consider biatrial enlargement No significant change since last tracing Confirmed by Fredia Sorrow [phone]) on 05/10/2019 5:20:34 PM   Radiology Dg Chest 2 View  Result Date: 05/10/2019 CLINICAL DATA:  73 year old female with palpitation. EXAM: CHEST - 2 VIEW COMPARISON:  Chest CT dated 03/26/2018 FINDINGS: There is no focal consolidation, pleural effusion, or pneumothorax. The cardiac silhouette is within normal limits. The aorta is slightly tortuous. There is compression fracture of the T12 vertebra which is age indeterminate but new since the CT of 03/26/2018. Correlation with clinical exam and point tenderness recommended. IMPRESSION: 1. No active cardiopulmonary disease. 2. Age indeterminate T12 compression fracture. Electronically Signed   By: Anner Crete M.D.   On: 05/10/2019 19:17    Procedures Procedures (including critical care time)  Medications Ordered in ED Medications - No data to display   Initial Impression / Assessment and Plan / ED Course  I have reviewed the triage vital signs and the nursing notes.  Pertinent labs & imaging results that were available during my care  of the patient were reviewed by me and considered in my medical decision making (see chart for details).        Cardiac monitoring here without any arrhythmias.  Heart rate in the 90s occasionally in the low 100s.  Patient without any shortness of breath no leg swelling no real concern for pulmonary embolus.  In addition patient's room air sats at rest are 100%.  Basic labs without significant abnormalities.  Chest x-ray did show evidence of an old T12 compression fraction which patient stated occurred in March.  EKG without any acute changes also stated above no arrhythmias.  Blood pressure here is been a little bit on the higher side.  Patient will follow-up with primary care doctor.  Patient instructed to return for irregular palpitations lasting 40 minutes or longer.  If she has intermittent palpitations recommend that she follow-up with her primary care doctor as well for consideration may be for some home cardiac monitoring.  Final Clinical Impressions(s) / ED Diagnoses   Final diagnoses:  History of palpitations    ED Discharge Orders    None       Fredia Sorrow, MD 05/10/19 1943

## 2019-05-10 NOTE — Progress Notes (Signed)
4:40pm: Spoke with Lovena Le, CMA at [facility]. Requested OV today for further evaluation of possible AFIB with SOB and elevated pulse. Lovena Le reviewed with Dr. Ardeth Perfect, was advised patient should go directly to ER today for further evaluation.   4:50pm: Spoke with Noemi Chapel, RN at [facility], verbal report given. Advised patient is en route via personal vehicle.

## 2019-05-10 NOTE — Progress Notes (Signed)
73 y.o. G0P0 Married White or Caucasian female here for annual exam.  She used to see Dr. Joan Flores and has seen Dr. Quincy Simmonds.  States she really wants to be on her HRT.    Denies vaginal bleeding.    H/o renal lesion that has been followed by urology, Dr. Estill Dooms, in [facility].  It is an angiomyolipoma.  It was embolized with interventional radiology, Dr. Laurence Ferrari.  CT this summer measured 3 x 4.5 x 5.3cm.  This is stable.  There is still some blood supply to the lesion.  She and Dr. Geroge Baseman have discussed repeat embolization.     H/O compression fracture x 3.  Has seen Dr. Delilah Shan.  She did not have a good experience with this provider.  Has seen Dr. Melvyn Novas in the past.  Is not sure she when she had the most recent fracture of the vertebrae.  When she was seen, it was after three months and so there was no recommended treatment.    PCP:  Dr. Jackelyn Poling.    Patient's last menstrual period was 08/18/1994 (approximate).          Sexually active: Yes.    The current method of family planning is post menopausal status.    Exercising: Yes.    some  Smoker:  no  Health Maintenance: Pap:  02/27/17 Neg   02/05/15 Neg  History of abnormal Pap:  Yes, LEEP 1996 MMG:  05/28/18 BIRADS1:Neg  Colonoscopy:  01/11/14 f/u 10 years, Dr. Olevia Perches, diverticulosis BMD:   05/10/18 Osteopenia  TDaP:  PCP Pneumonia vaccine(s):  done Shingrix:   completed Hep C testing: No Screening Labs: PCP   reports that she quit smoking about 46 years ago. She has never used smokeless tobacco. She reports current alcohol use of about 14.0 standard drinks of alcohol per week. She reports that she does not use drugs.  Past Medical History:  Diagnosis Date  . Abnormal breast finding 12/1998   left breast abnl  . Arthritis    --basal joint--right hand  . Carpal tunnel syndrome, right   . Compression fracture of T2 vertebra ([place]) 05/10/2019  . Diverticulosis 2003   mild  . Lumbar compression fracture ([place]) 07/13/2012  . Lumbar  disc disorder 2000   bulging  . Menopause   . Osteoporosis    Fx L4 age 22 water skiing accident  . Thyroid disease 10/2014   thyroid nodules--bx'd large nodule and was benign--sees Gen. surgeon with [facility]    Past Surgical History:  Procedure Laterality Date  . AUGMENTATION MAMMAPLASTY  1983   Implants have been removed  . BREAST SURGERY  2010   implants removed ruptured  . CERVICAL BIOPSY  W/ LOOP ELECTRODE EXCISION  1996   CIN  . IR EMBO TUMOR ORGAN ISCHEMIA INFARCT INC GUIDE ROADMAPPING  05/13/2017  . IR RADIOLOGIST EVAL & MGMT  04/15/2017  . IR RADIOLOGIST EVAL & MGMT  05/27/2017  . IR RADIOLOGIST EVAL & MGMT  01/21/2018  . IR RENAL SUPRASEL UNI S&I MOD SED  05/13/2017  . IR US GUIDE VASC ACCESS LEFT  05/13/2017  . KNEE SURGERY  2012   meniscus rt knee (both)  . L-2 fracture  2009   fall in aerobics  . L-4 fracture   water skiing    . TENOSYNOVECTOMY Right 03/11/2016   Procedure: TENOSYNOVECTOMY FLEXOR RIGHT RING FINGER;  Surgeon: Daryll Brod, MD;  Location: [facility];  Service: Orthopedics;  Laterality: Right;  . thyroid nodule biopsy  10/2014   --[facility], [place]--  . TONSILLECTOMY AND ADENOIDECTOMY  1964    Current Outpatient Medications  Medication Sig Dispense Refill  . Cholecalciferol (VITAMIN D) 2000 UNITS tablet Take 2,000 Units by mouth daily.    Marland Kitchen estradiol (VIVELLE-DOT) 0.05 MG/24HR patch PLACE ONE PATCH ONTO THE SKIN TWO TIMES A WEEK 24 patch 0  . fluticasone (FLONASE) 50 MCG/ACT nasal spray Place 2 sprays into the nose daily. 48 g 1  . progesterone (PROMETRIUM) 100 MG capsule TAKE ONE CAPSULE BY MOUTH DAILY 90 capsule 0   No current facility-administered medications for this visit.     Family History  Problem Relation Age of Onset  . Osteoporosis Mother   . Alzheimer's disease Mother   . Hypertension Father   . Heart attack Father   . Osteoporosis Maternal Grandmother   . Colon cancer Maternal Grandmother   . Colon cancer Other      Review of Systems  Constitutional:       Fractures  All other systems reviewed and are negative.   Exam:   BP (!) 160/80   Pulse (!) 116   Temp 98.2 F (36.8 C) (Temporal)   Ht 5\' 6"  (1.676 m)   Wt 157 lb 12.8 oz (71.6 kg)   LMP 08/18/1994 (Approximate)   BMI 25.47 kg/m   Height: 5\' 6"  (167.6 cm)  Ht Readings from Last 3 Encounters:  05/10/19 5\' 6"  (1.676 m)  03/01/18 5\' 7"  (1.702 m)  01/21/18 5\' 7"  (1.702 m)    General appearance: alert, cooperative and appears stated age Head: Normocephalic, without obvious abnormality, atraumatic Neck: no adenopathy, supple, symmetrical, trachea midline and thyroid normal to inspection and palpation Lungs: clear to auscultation bilaterally Breasts: normal appearance, no masses or tenderness Heart: irregularly irregular Abdomen: soft, non-tender; bowel sounds normal; no masses,  no organomegaly Extremities: extremities normal, atraumatic, no cyanosis or edema Skin: Skin color, texture, turgor normal. No rashes or lesions Lymph nodes: Cervical, supraclavicular, and axillary nodes normal. No abnormal inguinal nodes palpated Neurologic: Grossly normal   Pelvic: External genitalia:  no lesions              Urethra:  normal appearing urethra with no masses, tenderness or lesions              Bartholins and Skenes: normal                 Vagina: normal appearing vagina with normal color and discharge, no lesions              Cervix: no lesions              Pap taken: No. Bimanual Exam:  Uterus:  normal size, contour, position, consistency, mobility, non-tender              Adnexa: normal adnexa and no mass, fullness, tenderness               Rectovaginal: Confirms               Anus:  normal sphincter tone, no lesions  Chaperone was present for exam.  A:  Well Woman with normal exam Irregularly irregular heart rate today, concerning for afib H/o vertebral fractures x 3 H/o angiomyolipoma of the kidney, s/p embolization Thyroid  nodules diverticulosis  P:   Mammogram guidelines reviewed pap smear not indicated Called Dr. Jackelyn Poling to possible see pt today.  Recommended ER evaluation.  Pt is going to go to [facility].  We will call triage nurse to advise pt is being sent there today Advised removal of HRT estradiol patch today.  Was taken off while pt was in the office. Return annually or prn

## 2019-05-10 NOTE — ED Triage Notes (Signed)
PResents with irregular heart beat, she was having her annual exam with Dr. Sabra Heck and felt SOB and began having some palpitations. She was sent here. She states she feels ok, she even played 18 holes of golf.

## 2019-05-10 NOTE — Discharge Instructions (Addendum)
Cardiac monitoring here without any arrhythmias or abnormalities.  Basic labs and chest x-ray without any acute findings.  Blood pressure a little bit on the high side recommend following back up with your primary care doctor for blood pressure recheck.  Also if you continue to have some feelings of palpitations they could arrange for some home cardiac monitoring.  Do return for any rapid heart rate or irregular heart rate that seems to last 40 minutes or longer.

## 2019-05-13 DIAGNOSIS — M8580 Other specified disorders of bone density and structure, unspecified site: Secondary | ICD-10-CM | POA: Diagnosis not present

## 2019-05-13 DIAGNOSIS — Z780 Asymptomatic menopausal state: Secondary | ICD-10-CM | POA: Diagnosis not present

## 2019-05-13 DIAGNOSIS — R002 Palpitations: Secondary | ICD-10-CM | POA: Diagnosis not present

## 2019-05-26 ENCOUNTER — Other Ambulatory Visit: Payer: Self-pay | Admitting: Internal Medicine

## 2019-05-26 DIAGNOSIS — Z1231 Encounter for screening mammogram for malignant neoplasm of breast: Secondary | ICD-10-CM

## 2019-06-08 DIAGNOSIS — R002 Palpitations: Secondary | ICD-10-CM | POA: Diagnosis not present

## 2019-06-08 DIAGNOSIS — M859 Disorder of bone density and structure, unspecified: Secondary | ICD-10-CM | POA: Diagnosis not present

## 2019-06-08 DIAGNOSIS — R7989 Other specified abnormal findings of blood chemistry: Secondary | ICD-10-CM | POA: Diagnosis not present

## 2019-06-22 DIAGNOSIS — Z1339 Encounter for screening examination for other mental health and behavioral disorders: Secondary | ICD-10-CM | POA: Diagnosis not present

## 2019-06-22 DIAGNOSIS — E041 Nontoxic single thyroid nodule: Secondary | ICD-10-CM | POA: Diagnosis not present

## 2019-06-22 DIAGNOSIS — Z1331 Encounter for screening for depression: Secondary | ICD-10-CM | POA: Diagnosis not present

## 2019-06-22 DIAGNOSIS — S22000D Wedge compression fracture of unspecified thoracic vertebra, subsequent encounter for fracture with routine healing: Secondary | ICD-10-CM | POA: Diagnosis not present

## 2019-06-22 DIAGNOSIS — R82998 Other abnormal findings in urine: Secondary | ICD-10-CM | POA: Diagnosis not present

## 2019-06-22 DIAGNOSIS — M810 Age-related osteoporosis without current pathological fracture: Secondary | ICD-10-CM | POA: Diagnosis not present

## 2019-06-22 DIAGNOSIS — Z780 Asymptomatic menopausal state: Secondary | ICD-10-CM | POA: Diagnosis not present

## 2019-06-22 DIAGNOSIS — Z0000 Encounter for general adult medical examination without abnormal findings: Secondary | ICD-10-CM | POA: Diagnosis not present

## 2019-06-22 DIAGNOSIS — R918 Other nonspecific abnormal finding of lung field: Secondary | ICD-10-CM | POA: Diagnosis not present

## 2019-06-23 DIAGNOSIS — S22000D Wedge compression fracture of unspecified thoracic vertebra, subsequent encounter for fracture with routine healing: Secondary | ICD-10-CM | POA: Diagnosis not present

## 2019-06-23 DIAGNOSIS — M810 Age-related osteoporosis without current pathological fracture: Secondary | ICD-10-CM | POA: Diagnosis not present

## 2019-06-23 DIAGNOSIS — Z7689 Persons encountering health services in other specified circumstances: Secondary | ICD-10-CM | POA: Diagnosis not present

## 2019-07-12 ENCOUNTER — Other Ambulatory Visit: Payer: Self-pay

## 2019-07-12 ENCOUNTER — Ambulatory Visit
Admission: RE | Admit: 2019-07-12 | Discharge: 2019-07-12 | Disposition: A | Discharge status: Home / Self Care | Payer: PPO | Source: Ambulatory Visit | Attending: Internal Medicine | Admitting: Internal Medicine

## 2019-07-12 DIAGNOSIS — Z1231 Encounter for screening mammogram for malignant neoplasm of breast: Secondary | ICD-10-CM | POA: Diagnosis not present

## 2019-07-21 DIAGNOSIS — D1722 Benign lipomatous neoplasm of skin and subcutaneous tissue of left arm: Secondary | ICD-10-CM | POA: Diagnosis not present

## 2019-07-21 DIAGNOSIS — D2262 Melanocytic nevi of left upper limb, including shoulder: Secondary | ICD-10-CM | POA: Diagnosis not present

## 2019-07-21 DIAGNOSIS — L821 Other seborrheic keratosis: Secondary | ICD-10-CM | POA: Diagnosis not present

## 2019-07-21 DIAGNOSIS — D485 Neoplasm of uncertain behavior of skin: Secondary | ICD-10-CM | POA: Diagnosis not present

## 2019-07-21 DIAGNOSIS — Z23 Encounter for immunization: Secondary | ICD-10-CM | POA: Diagnosis not present

## 2019-07-21 DIAGNOSIS — D0462 Carcinoma in situ of skin of left upper limb, including shoulder: Secondary | ICD-10-CM | POA: Diagnosis not present

## 2019-07-21 DIAGNOSIS — D225 Melanocytic nevi of trunk: Secondary | ICD-10-CM | POA: Diagnosis not present

## 2019-07-21 DIAGNOSIS — Z86018 Personal history of other benign neoplasm: Secondary | ICD-10-CM | POA: Diagnosis not present

## 2019-07-28 DIAGNOSIS — M810 Age-related osteoporosis without current pathological fracture: Secondary | ICD-10-CM | POA: Diagnosis not present

## 2019-08-03 DIAGNOSIS — Z1212 Encounter for screening for malignant neoplasm of rectum: Secondary | ICD-10-CM | POA: Diagnosis not present

## 2019-08-18 DIAGNOSIS — D0462 Carcinoma in situ of skin of left upper limb, including shoulder: Secondary | ICD-10-CM | POA: Diagnosis not present

## 2019-09-01 ENCOUNTER — Other Ambulatory Visit: Payer: Self-pay | Admitting: Interventional Radiology

## 2019-09-01 DIAGNOSIS — D1771 Benign lipomatous neoplasm of kidney: Secondary | ICD-10-CM

## 2019-09-07 ENCOUNTER — Ambulatory Visit: Payer: PPO | Attending: Internal Medicine

## 2019-09-07 DIAGNOSIS — Z23 Encounter for immunization: Secondary | ICD-10-CM | POA: Insufficient documentation

## 2019-09-07 NOTE — Progress Notes (Signed)
   Covid-19 Vaccination Clinic  Name:  Ann Vang    MRN: [id number] DOB: [birth year]  09/07/2019  [name] was observed post Covid-19 immunization for 15 minutes without incidence. She was provided with Vaccine Information Sheet and instruction to access the V-Safe system.   [name] was instructed to call 911 with any severe reactions post vaccine: Marland Kitchen Difficulty breathing  . Swelling of your face and throat  . A fast heartbeat  . A bad rash all over your body  . Dizziness and weakness    Immunizations Administered    Name Date Dose VIS Date Route   Pfizer COVID-19 Vaccine 09/07/2019  9:48 AM 0.3 mL 07/29/2019 Intramuscular   Manufacturer: [place]   Lot: [id number]   [place]: [id number]

## 2019-09-09 ENCOUNTER — Other Ambulatory Visit: Payer: Self-pay | Admitting: *Deleted

## 2019-09-09 DIAGNOSIS — D179 Benign lipomatous neoplasm, unspecified: Secondary | ICD-10-CM

## 2019-09-14 DIAGNOSIS — D179 Benign lipomatous neoplasm, unspecified: Secondary | ICD-10-CM | POA: Diagnosis not present

## 2019-09-14 LAB — COMPLETE METABOLIC PANEL WITH GFR
AG Ratio: 2.1 (calc) (ref 1.0–2.5)
ALT: 10 U/L (ref 6–29)
AST: 13 U/L (ref 10–35)
Albumin: 4.2 g/dL (ref 3.6–5.1)
Alkaline phosphatase (APISO): 56 U/L (ref 37–153)
BUN/Creatinine Ratio: 34 (calc) — ABNORMAL HIGH (ref 6–22)
BUN: 18 mg/dL (ref 7–25)
CO2: 27 mmol/L (ref 20–32)
Calcium: 8.9 mg/dL (ref 8.6–10.4)
Chloride: 106 mmol/L (ref 98–110)
Creat: 0.53 mg/dL — ABNORMAL LOW (ref 0.60–0.93)
GFR, Est African American: 109 mL/min/{1.73_m2} (ref >=60)
GFR, Est Non African American: 94 mL/min/{1.73_m2} (ref >=60)
Globulin: 2 g/dL (calc) (ref 1.9–3.7)
Glucose, Bld: 91 mg/dL (ref 65–99)
Potassium: 4.2 mmol/L (ref 3.5–5.3)
Sodium: 142 mmol/L (ref 135–146)
Total Bilirubin: 0.6 mg/dL (ref 0.2–1.2)
Total Protein: 6.2 g/dL (ref 6.1–8.1)

## 2019-09-22 ENCOUNTER — Other Ambulatory Visit: Payer: Self-pay

## 2019-09-22 ENCOUNTER — Ambulatory Visit (HOSPITAL_COMMUNITY)
Admission: RE | Admit: 2019-09-22 | Discharge: 2019-09-22 | Disposition: A | Discharge status: Home / Self Care | Payer: PPO | Source: Ambulatory Visit | Attending: Interventional Radiology | Admitting: Interventional Radiology

## 2019-09-22 DIAGNOSIS — D1771 Benign lipomatous neoplasm of kidney: Secondary | ICD-10-CM | POA: Insufficient documentation

## 2019-09-22 MED ORDER — SODIUM CHLORIDE (PF) 0.9 % IJ SOLN
INTRAMUSCULAR | Status: AC
  Filled 2019-09-22: qty 50

## 2019-09-22 MED ORDER — IOHEXOL 350 MG/ML SOLN
100.0000 mL | Freq: Once | INTRAVENOUS | Status: AC | PRN
  Administered 2019-09-22: 100 mL via INTRAVENOUS

## 2019-09-26 ENCOUNTER — Ambulatory Visit: Payer: PPO | Attending: Internal Medicine

## 2019-09-26 DIAGNOSIS — Z23 Encounter for immunization: Secondary | ICD-10-CM | POA: Insufficient documentation

## 2019-09-26 NOTE — Progress Notes (Signed)
   Covid-19 Vaccination Clinic  Name:  LAKIMA TKAC    MRN: [id number] DOB: [birth year]  09/26/2019  Ms. Gelfand was observed post Covid-19 immunization for 15 minutes without incidence. She was provided with Vaccine Information Sheet and instruction to access the V-Safe system.   Ms. Clingerman was instructed to call 911 with any severe reactions post vaccine: Marland Kitchen Difficulty breathing  . Swelling of your face and throat  . A fast heartbeat  . A bad rash all over your body  . Dizziness and weakness    Immunizations Administered    Name Date Dose VIS Date Route   Pfizer COVID-19 Vaccine 09/26/2019 12:44 PM 0.3 mL 07/29/2019 Intramuscular   Manufacturer: [place]   Lot: [id number]   [place]: [id number]

## 2019-09-28 ENCOUNTER — Encounter: Payer: Self-pay | Admitting: *Deleted

## 2019-09-28 ENCOUNTER — Other Ambulatory Visit: Payer: Self-pay

## 2019-09-28 ENCOUNTER — Ambulatory Visit
Admission: RE | Admit: 2019-09-28 | Discharge: 2019-09-28 | Disposition: A | Discharge status: Home / Self Care | Payer: PPO | Source: Ambulatory Visit | Attending: Interventional Radiology | Admitting: Interventional Radiology

## 2019-09-28 DIAGNOSIS — D1771 Benign lipomatous neoplasm of kidney: Secondary | ICD-10-CM

## 2019-09-28 DIAGNOSIS — D3002 Benign neoplasm of left kidney: Secondary | ICD-10-CM | POA: Diagnosis not present

## 2019-09-28 HISTORY — PX: IR RADIOLOGIST EVAL & MGMT: IMG5224

## 2019-09-28 NOTE — Progress Notes (Signed)
Chief Complaint: Patient was consulted remotely today (TeleHealth) for left renal AML at the request of [place].    Referring Physician(s): Dr. Denyce Robert  History of Present Illness: Ann Vang is a 74 y.o. 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  She underwent transarterial embolization via a left radial approach on 05/13/2017 and presents today for scheduled follow-up evaluation.  Clinically, Ann Vang is doing exceptionally well.  She is asymptomatic.  She denies flank pain, hematuria, dysuria, fever, chills or other systemic symptoms.  CT imaging dated 09/22/2019 demonstrates continued slight interval involution of the treated renal angiomyolipoma which now measures 4.9 x 3.2 x 5.2 cm.  Past Medical History:  Diagnosis Date  . Abnormal breast finding 12/1998   left breast abnl  . Arthritis    --basal joint--right hand  . Carpal tunnel syndrome, right   . Compression fracture of T2 vertebra ([place]) 05/10/2019  . Diverticulosis 2003   mild  . Lumbar compression fracture ([place]) 07/13/2012  . Lumbar disc disorder 2000   bulging  . Menopause   . Osteoporosis    Fx L4 age 18 water skiing accident  . Thyroid disease 10/2014   thyroid nodules--bx'd large nodule and was benign--sees Gen. surgeon with [facility]    Past Surgical History:  Procedure Laterality Date  . AUGMENTATION MAMMAPLASTY  1983   Implants have been removed  . BREAST SURGERY  2010   implants removed ruptured  . CERVICAL BIOPSY  W/ LOOP ELECTRODE EXCISION  1996   CIN  . IR EMBO TUMOR ORGAN ISCHEMIA INFARCT INC GUIDE ROADMAPPING  05/13/2017  . IR RADIOLOGIST EVAL & MGMT  04/15/2017  . IR RADIOLOGIST EVAL & MGMT  05/27/2017  . IR RADIOLOGIST EVAL & MGMT  01/21/2018  . IR RADIOLOGIST EVAL & MGMT  09/28/2019  . IR RENAL SUPRASEL UNI S&I MOD SED  05/13/2017  . IR US GUIDE VASC ACCESS LEFT  05/13/2017  . KNEE SURGERY  2012   meniscus rt knee (both)  . L-2 fracture   2009   fall in aerobics  . L-4 fracture   water skiing    . TENOSYNOVECTOMY Right 03/11/2016   Procedure: TENOSYNOVECTOMY FLEXOR RIGHT RING FINGER;  Surgeon: Daryll Brod, MD;  Location: [facility];  Service: Orthopedics;  Laterality: Right;  . thyroid nodule biopsy  10/2014   --[facility], [place]--  . TONSILLECTOMY AND ADENOIDECTOMY  1964    Allergies: Patient has no known allergies.  Medications: Prior to Admission medications   Medication Sig Start Date End Date Taking? Authorizing Provider  Cholecalciferol (VITAMIN D) 2000 UNITS tablet Take 2,000 Units by mouth daily.    [provider]  estradiol (VIVELLE-DOT) 0.05 MG/24HR patch PLACE ONE PATCH ONTO THE SKIN TWO TIMES A WEEK 02/23/19   Megan Salon, MD  fluticasone [facility]) 50 MCG/ACT nasal spray Place 2 sprays into the nose daily. 07/13/12   Burnice Logan, MD  progesterone (PROMETRIUM) 100 MG capsule TAKE ONE CAPSULE BY MOUTH DAILY 02/23/19   Megan Salon, MD     Family History  Problem Relation Age of Onset  . Osteoporosis Mother   . Alzheimer's disease Mother   . Hypertension Father   . Heart attack Father   . Osteoporosis Maternal Grandmother   . Colon cancer Maternal Grandmother   . Colon cancer Other     Social History   Socioeconomic History  . Marital status: Married    Spouse name:  Not on file  . Number of children: Not on file  . Years of education: Not on file  . Highest education level: Not on file  Occupational History  . Not on file  Tobacco Use  . Smoking status: Former Smoker    Quit date: 12/21/1972    Years since quitting: 46.8  . Smokeless tobacco: Never Used  Substance and Sexual Activity  . Alcohol use: Yes    Alcohol/week: 14.0 standard drinks    Types: 14 Glasses of wine per week  . Drug use: No  . Sexual activity: Yes    Partners: Male    Birth control/protection: Post-menopausal  Other Topics Concern  . Not on file  Social History Narrative  . Not on  file   Social Determinants of Health   Financial Resource Strain:   . Difficulty of Paying Living Expenses: Not on file  Food Insecurity:   . Worried About Charity fundraiser in the Last Year: Not on file  . Ran Out of Food in the Last Year: Not on file  Transportation Needs:   . Lack of Transportation (Medical): Not on file  . Lack of Transportation (Non-Medical): Not on file  Physical Activity:   . Days of Exercise per Week: Not on file  . Minutes of Exercise per Session: Not on file  Stress:   . Feeling of Stress : Not on file  Social Connections:   . Frequency of Communication with Friends and Family: Not on file  . Frequency of Social Gatherings with Friends and Family: Not on file  . Attends Religious Services: Not on file  . Active Member of Clubs or Organizations: Not on file  . Attends Archivist Meetings: Not on file  . Marital Status: Not on file    Review of Systems  Review of Systems: A 12 point ROS discussed and pertinent positives are indicated in the HPI above.  All other systems are negative.  Physical Exam No direct physical exam was performed (except for noted visual exam findings with Video Visits).   Vital Signs: LMP 08/18/1994 (Approximate)   Imaging: CT ANGIO ABDOMEN W &/OR WO CONTRAST  Result Date: 09/22/2019 CLINICAL DATA:  Large left angiomyolipoma status post embolization, surveillance follow-up EXAM: CT ANGIOGRAPHY ABDOMEN TECHNIQUE: Multidetector CT imaging of the abdomen was performed using the standard protocol during bolus administration of intravenous contrast. Multiplanar reconstructed images and MIPs were obtained and reviewed to evaluate the vascular anatomy. CONTRAST:  179mL OMNIPAQUE IOHEXOL 350 MG/ML SOLN COMPARISON:  01/18/2018, 03/30/2017 FINDINGS: VASCULAR Aorta: Minor atherosclerotic change tortuosity without aneurysm, dissection, occlusive process, or other acute vascular process. No retroperitoneal hemorrhage or hematoma.  Celiac: Atherosclerotic origin but remains patent including its branches SMA: Minor atherosclerotic change but remains patent throughout the central mesentery Renals: Widely patent origins.  No accessory renal vasculature. IMA: Remains patent off the distal aorta including its branches Inflow: Iliac bifurcation is tortuous but remains patent Veins: Dedicated venous phase imaging demonstrates no veno-occlusive process within the abdomen. Review of the MIP images confirms the above findings. NON-VASCULAR Lower chest: Inferior right middle lobe 7 mm nodule, image 10 series 4 is stable dating back to 03/30/2017. Normal heart size. No pericardial or pleural effusion. Hepatobiliary: Similar scattered hypodense hepatic cysts. No new focal hepatic abnormality or intrahepatic biliary dilatation. Hepatic and portal veins are patent. Gallbladder and biliary system unremarkable. Common bile duct nondilated. Pancreas: Unremarkable. No pancreatic ductal dilatation or surrounding inflammatory changes. Spleen: Normal in size without  focal abnormality. Adrenals/Urinary Tract: Normal adrenal glands. Right kidney and ureter demonstrate no acute process or hydronephrosis. No focal right renal abnormality. Left kidney redemonstrates the heterogeneous exophytic solid enhancing mass containing fat extending anteriorly off the upper pole along the splenic hilum. Lesion has undergone previous embolization with Lipiodol manifesting as hyperattenuation throughout the lesion as well as coil embolization of the main feeding vessel. Remeasured in a similar fashion the lesion is slightly smaller measuring 4.9 x 3.2 x 5.2 cm, previously 5.3 x 3.4 x 6.0 cm. No evidence of interval hemorrhage or other complication. No new renal lesion. Stomach/Bowel: Negative for bowel obstruction, significant dilatation, ileus, free air. Minor diverticular changes of the transverse colon without acute inflammation. No free fluid, fluid collection, hemorrhage,  abscess or hematoma. No ascites. Lymphatic: No bulky adenopathy. Other: No abdominal wall hernia or abnormality. No abdominopelvic ascites. Musculoskeletal: Degenerative changes of the lumbar spine as well as lower lumbar facet arthropathy and chronic compression fractures at T12, L2, and L4 IMPRESSION: VASCULAR Minor aortic atherosclerosis without significant abdominal vascular finding. NON-VASCULAR Stable appearance of the left renal angiomyolipoma following prior embolization, with slight continued decrease in size. No evidence of delayed complication or interval spontaneous hemorrhage. No other acute intra-abdominal finding. Stable inferior right middle lobe 7 mm nodule dating back to 2018. Electronically Signed   By: Jerilynn Mages.  Shick M.D.   On: 09/22/2019 11:03   IR Radiologist Eval & Mgmt  Result Date: 09/28/2019 Please refer to notes tab for details about interventional procedure. (Op Note)   Labs:  CBC: Recent Labs    05/10/19 1719  WBC 6.8  HGB 14.1  HCT 42.5  PLT 213    COAGS: No results for input(s): INR, APTT in the last 8760 hours.  BMP: Recent Labs    05/10/19 1719 09/14/19 0718  NA 141 142  K 3.5 4.2  CL 105 106  CO2 23 27  GLUCOSE 97 91  BUN 26* 18  CALCIUM 9.0 8.9  CREATININE 0.53 0.53*  GFRNONAA >60 94  GFRAA >60 109    LIVER FUNCTION TESTS: Recent Labs    09/14/19 0718  BILITOT 0.6  AST 13  ALT 10  PROT 6.2    TUMOR MARKERS: No results for input(s): AFPTM, CEA, CA199, CHROMGRNA in the last 8760 hours.  Assessment and Plan:  Mrs. Wortmann continues to do very well 2 years status post embolization of 7.2 cm left renal angiomyolipoma.  CT arteriogram imaging dated 09/22/2019 demonstrates continued involution of the treated angiomyolipoma which now measures 4.9 x 3.2 x 5.2 cm compared to 5.3 x 3.4 x 6.0 cm previously.  No definitive arterial enhancement in the residual soft tissue.  Given the continued decrease in size following treatment 2 years  previously, we can likely extend the interval between surveillance.  1.)  Repeat CT arteriogram and clinic visit in 2 years (February 2023).   Electronically Signed: Jacqulynn Cadet 09/28/2019, 1:21 PM   I spent a total of 10 Minutes in remote  clinical consultation, greater than 50% of which was counseling/coordinating care for left renal angiomyolipoma.    Visit type: Audio and video [employer]).   Alternative for in-person consultation at [facility], [address] [place], [place], [place]. This visit type was conducted due to national recommendations for restrictions regarding the COVID-19 Pandemic (e.g. social distancing).  This format is felt to be most appropriate for this patient at this time.  All issues noted in this document were discussed and addressed.

## 2019-10-14 ENCOUNTER — Ambulatory Visit: Payer: PPO

## 2019-10-18 DIAGNOSIS — H35372 Puckering of macula, left eye: Secondary | ICD-10-CM | POA: Diagnosis not present

## 2019-10-18 DIAGNOSIS — H2513 Age-related nuclear cataract, bilateral: Secondary | ICD-10-CM | POA: Diagnosis not present

## 2019-10-18 DIAGNOSIS — H524 Presbyopia: Secondary | ICD-10-CM | POA: Diagnosis not present

## 2019-10-18 DIAGNOSIS — D3131 Benign neoplasm of right choroid: Secondary | ICD-10-CM | POA: Diagnosis not present

## 2019-12-14 DIAGNOSIS — L570 Actinic keratosis: Secondary | ICD-10-CM | POA: Diagnosis not present

## 2019-12-14 DIAGNOSIS — D2262 Melanocytic nevi of left upper limb, including shoulder: Secondary | ICD-10-CM | POA: Diagnosis not present

## 2019-12-14 DIAGNOSIS — L821 Other seborrheic keratosis: Secondary | ICD-10-CM | POA: Diagnosis not present

## 2019-12-21 DIAGNOSIS — M810 Age-related osteoporosis without current pathological fracture: Secondary | ICD-10-CM | POA: Diagnosis not present

## 2019-12-21 DIAGNOSIS — R002 Palpitations: Secondary | ICD-10-CM | POA: Diagnosis not present

## 2019-12-27 ENCOUNTER — Other Ambulatory Visit: Payer: Self-pay | Admitting: Internal Medicine

## 2019-12-27 DIAGNOSIS — R918 Other nonspecific abnormal finding of lung field: Secondary | ICD-10-CM

## 2019-12-29 DIAGNOSIS — R7989 Other specified abnormal findings of blood chemistry: Secondary | ICD-10-CM | POA: Diagnosis not present

## 2019-12-29 DIAGNOSIS — M8000XA Age-related osteoporosis with current pathological fracture, unspecified site, initial encounter for fracture: Secondary | ICD-10-CM | POA: Diagnosis not present

## 2020-01-27 DIAGNOSIS — M85852 Other specified disorders of bone density and structure, left thigh: Secondary | ICD-10-CM | POA: Diagnosis not present

## 2020-01-27 DIAGNOSIS — M85851 Other specified disorders of bone density and structure, right thigh: Secondary | ICD-10-CM | POA: Diagnosis not present

## 2020-01-30 ENCOUNTER — Ambulatory Visit
Admission: RE | Admit: 2020-01-30 | Discharge: 2020-01-30 | Disposition: A | Discharge status: Home / Self Care | Payer: PPO | Source: Ambulatory Visit | Attending: Internal Medicine | Admitting: Internal Medicine

## 2020-01-30 ENCOUNTER — Other Ambulatory Visit: Payer: PPO

## 2020-01-30 ENCOUNTER — Other Ambulatory Visit: Payer: Self-pay

## 2020-01-30 DIAGNOSIS — R918 Other nonspecific abnormal finding of lung field: Secondary | ICD-10-CM

## 2020-02-29 DIAGNOSIS — K921 Melena: Secondary | ICD-10-CM | POA: Diagnosis not present

## 2020-02-29 DIAGNOSIS — R002 Palpitations: Secondary | ICD-10-CM | POA: Diagnosis not present

## 2020-03-01 DIAGNOSIS — E041 Nontoxic single thyroid nodule: Secondary | ICD-10-CM | POA: Diagnosis not present

## 2020-03-06 ENCOUNTER — Other Ambulatory Visit: Payer: Self-pay | Admitting: *Deleted

## 2020-03-06 DIAGNOSIS — R002 Palpitations: Secondary | ICD-10-CM

## 2020-03-12 ENCOUNTER — Encounter: Payer: Self-pay | Admitting: Cardiovascular Disease

## 2020-03-12 ENCOUNTER — Telehealth: Payer: Self-pay | Admitting: Nurse Practitioner

## 2020-03-12 ENCOUNTER — Ambulatory Visit (INDEPENDENT_AMBULATORY_CARE_PROVIDER_SITE_OTHER): Payer: PPO

## 2020-03-12 DIAGNOSIS — R002 Palpitations: Secondary | ICD-10-CM

## 2020-03-12 NOTE — Telephone Encounter (Signed)
   I received a call this evening from [employer] informing me that Ms. Civello began wearing her monitor today and her baseline recording was atrial fibrillation with the rate up to 110 bpm.  Monitor was ordered by primary care who is notes to which I do not have access.  I called patient's phone number-[phone] and received voicemail.  Left message requesting callback.  Patient is not currently a patient of our practice and has no future appointment scheduled, but monitor was ordered through our office.  Murray Hodgkins, NP 03/12/2020, 7:26 PM

## 2020-03-13 ENCOUNTER — Telehealth: Payer: Self-pay

## 2020-03-13 NOTE — Telephone Encounter (Signed)
Received from [employer] Day 1 and Day 2 of the pts monitor results showing Afib with RVR.   Lm for the pt to check on her symptoms.   Pt has not been in our office as a patient... pt had seen her PCP Dr. Ardeth Perfect for palpitations.   I called and spoke with his nurse, Hans Eden after reviewing with the DOD.. Dr. [name].   I advised her per Dr. [name]: Needs Echo, and recommendation for anticoagulation if not at risk of bleeding.   I faxed her the reports to [phone] with confirmation that is was received.   I also advised her that Dr. [name] is willing to speak with him today of he would like and gave her my immediate number to call back of needed.

## 2020-03-16 DIAGNOSIS — K921 Melena: Secondary | ICD-10-CM | POA: Diagnosis not present

## 2020-03-24 ENCOUNTER — Telehealth: Payer: Self-pay | Admitting: Student

## 2020-03-24 NOTE — Telephone Encounter (Signed)
   Received page from [facility] about critical EKG. Called and talked with staff who reported they received an alert for a manuel trigger for "syncope" at 1:33 Central Time (2:33pm EST). She was in normal sinus rhythm with rate in the 80's at the time. [employer] tried to called patient but were unable to reach her. I tried to call patient and was also unable to reach her. Monitor was ordered by PCP - patient has never been seen by our practice. However, monitor previously showed atrial fibrillation with RVR. Our office notified PCP of these finding and recommendations for anticoagulation. Discussed with DOD, Dr. Radford Pax, who recommended calling EMS if unable to reach patient given she could possibly have had a stroke. I called and spoke with EMS and asked them to check on patient. I was then able to reach patient's husband (OK per [facility]) who was with patient. Patient feeling completely fine. No syncope. Called and updated EMS and informed them we no longer needed safety check.  Of note, I also confirmed that PCP had notified patient of atrial fibrillation. Patient states she has been started on anticoagulation.  Patient thanked me for calling and checking on her.  Darreld Mclean, PA-C 03/24/2020 4:37 PM

## 2020-05-14 ENCOUNTER — Other Ambulatory Visit: Payer: Self-pay | Admitting: Internal Medicine

## 2020-05-14 DIAGNOSIS — Z1231 Encounter for screening mammogram for malignant neoplasm of breast: Secondary | ICD-10-CM

## 2020-05-29 ENCOUNTER — Ambulatory Visit: Payer: PPO | Admitting: Interventional Cardiology

## 2020-05-29 ENCOUNTER — Other Ambulatory Visit: Payer: Self-pay

## 2020-05-29 ENCOUNTER — Encounter: Payer: Self-pay | Admitting: Interventional Cardiology

## 2020-05-29 VITALS — BP 146/82 | HR 72 | Ht 66.0 in | Wt 162.2 lb

## 2020-05-29 DIAGNOSIS — R002 Palpitations: Secondary | ICD-10-CM

## 2020-05-29 DIAGNOSIS — I4891 Unspecified atrial fibrillation: Secondary | ICD-10-CM

## 2020-05-29 MED ORDER — METOPROLOL TARTRATE 25 MG PO TABS: 25.0000 mg | ORAL_TABLET | Freq: Two times a day (BID) | ORAL | 3 refills | Status: DC | PRN

## 2020-05-29 NOTE — Patient Instructions (Signed)
Medication Instructions:  Your physician has recommended you make the following change in your medication:   START: metoprolol tartrate (lopressor) 25 mg tablet: Take 1 tablet by mouth twice a day AS NEEDED for palpitations  *If you need a refill on your cardiac medications before your next appointment, please call your pharmacy*   Lab Work: None  If you have labs (blood work) drawn today and your tests are completely normal, you will receive your results only by: Marland Kitchen MyChart Message (if you have MyChart) OR . A paper copy in the mail If you have any lab test that is abnormal or we need to change your treatment, we will call you to review the results.   Testing/Procedures: Your physician has requested that you have an echocardiogram. Echocardiography is a painless test that uses sound waves to create images of your heart. It provides your doctor with information about the size and shape of your heart and how well your heart's chambers and valves are working. This procedure takes approximately one hour. There are no restrictions for this procedure.  Follow-Up: At [facility], you and your health needs are our priority.  As part of our continuing mission to provide you with exceptional heart care, we have created designated Provider Care Teams.  These Care Teams include your primary Cardiologist (physician) and Advanced Practice Providers (APPs -  Physician Assistants and Nurse Practitioners) who all work together to provide you with the care you need, when you need it.  We recommend signing up for the patient portal called "MyChart".  Sign up information is provided on this After Visit Summary.  MyChart is used to connect with patients for Virtual Visits (Telemedicine).  Patients are able to view lab/test results, encounter notes, upcoming appointments, etc.  Non-urgent messages can be sent to your provider as well.   To learn more about what you can do with MyChart, go to  [email or web address].    Your next appointment:   6 month(s)  The format for your next appointment:   In Person  Provider:   You may see [name], MD or one of the following Advanced Practice Providers on your designated Care Team:    Melina Copa, PA-C  Ermalinda Barrios, PA-C    Other Instructions None

## 2020-05-29 NOTE — Progress Notes (Signed)
Cardiology Office Note   Date:  05/29/2020   ID:  Ann Vang, DOB 17-Oct-1945, MRN [id number]  PCP:  Ann Hatchet, MD    No chief complaint on file.  PAF  Wt Readings from Last 3 Encounters:  05/29/20 162 lb 3.2 oz (73.6 kg)  05/10/19 157 lb 12.8 oz (71.6 kg)  03/01/18 162 lb (73.5 kg)       History of Present Illness: Ann Vang is a 74 y.o. female who is being seen today for the evaluation of AFib at the request of Ann Hatchet, MD.  She has had palpitations since May 2021.  Apple watch showed AFib.  Confirmed by monitor in July 2021.  She was started on Xarelto at that time.  No bleeding issues on Xarelto.  No bruising issues.  Denies : Chest pain. Dizziness. Leg edema. Nitroglycerin use. Orthopnea. Paroxysmal nocturnal dyspnea. Shortness of breath. Syncope.   She has 2-3 episodes of AFib a week and they last minutes to hours, but will stop on its own.  She exercises frequently with cardio, pilates and weights, golf.  No problems with exercise.    She has cut back on caffeine.    BP at home is in the 120s/70s range.      Past Medical History:  Diagnosis Date  . Abnormal breast finding 12/1998   left breast abnl  . Arthritis    --basal joint--right hand  . Carpal tunnel syndrome, right   . Compression fracture of T2 vertebra ([place]) 05/10/2019  . Diverticulosis 2003   mild  . Lumbar compression fracture ([place]) 07/13/2012  . Lumbar disc disorder 2000   bulging  . Melena   . Menopause   . Osteoporosis    Fx L4 age 71 water skiing accident  . Palpitations   . Thyroid disease 10/2014   thyroid nodules--bx'd large nodule and was benign--sees Gen. surgeon with [facility]    Past Surgical History:  Procedure Laterality Date  . AUGMENTATION MAMMAPLASTY  1983   Implants have been removed  . BREAST SURGERY  2010   implants removed ruptured  . CERVICAL BIOPSY  W/ LOOP ELECTRODE EXCISION  1996   CIN  . IR EMBO TUMOR ORGAN ISCHEMIA INFARCT  INC GUIDE ROADMAPPING  05/13/2017  . IR RADIOLOGIST EVAL & MGMT  04/15/2017  . IR RADIOLOGIST EVAL & MGMT  05/27/2017  . IR RADIOLOGIST EVAL & MGMT  01/21/2018  . IR RADIOLOGIST EVAL & MGMT  09/28/2019  . IR RENAL SUPRASEL UNI S&I MOD SED  05/13/2017  . IR US GUIDE VASC ACCESS LEFT  05/13/2017  . KNEE SURGERY  2012   meniscus rt knee (both)  . L-2 fracture  2009   fall in aerobics  . L-4 fracture   water skiing    . TENOSYNOVECTOMY Right 03/11/2016   Procedure: TENOSYNOVECTOMY FLEXOR RIGHT RING FINGER;  Surgeon: Ann Brod, MD;  Location: [facility];  Service: Orthopedics;  Laterality: Right;  . thyroid nodule biopsy  10/2014   --[facility], [place]--  . TONSILLECTOMY AND ADENOIDECTOMY  1964     Current Outpatient Medications  Medication Sig Dispense Refill  . Bacillus Coagulans-Inulin (PROBIOTIC) 1-250 BILLION-MG CAPS Take by mouth.    . Cholecalciferol (VITAMIN D) 2000 UNITS tablet Take 2,000 Units by mouth daily.    Marland Kitchen estradiol (VIVELLE-DOT) 0.05 MG/24HR patch PLACE ONE PATCH ONTO THE SKIN TWO TIMES A WEEK 24 patch 0  . fluticasone (FLONASE) 50 MCG/ACT nasal spray Place 2 sprays  into the nose daily. 48 g 1  . magnesium gluconate (MAGONATE) 500 MG tablet Take 500 mg by mouth 2 (two) times daily.    . milk thistle 175 MG tablet Take 175 mg by mouth daily.    . progesterone (PROMETRIUM) 100 MG capsule TAKE ONE CAPSULE BY MOUTH DAILY 90 capsule 0  . vitamin k 100 MCG tablet Take 300 mcg by mouth daily.    Alveda Reasons 20 MG TABS tablet Take 20 mg by mouth daily.     No current facility-administered medications for this visit.    Allergies:   Patient has no known allergies.    Social History:  The patient  reports that she quit smoking about 47 years ago. She has never used smokeless tobacco. She reports current alcohol use of about 14.0 standard drinks of alcohol per week. She reports that she does not use drugs.   Family History:  The patient's family history includes  Alzheimer's disease in her mother; Colon cancer in her maternal grandmother and another family member; Heart attack in her father; Hypertension in her father; Osteoporosis in her maternal grandmother and mother.    ROS:  Please see the history of present illness.   Otherwise, review of systems are positive for palpitations.   All other systems are reviewed and negative.    PHYSICAL EXAM: VS:  BP (!) 146/82   Pulse 72   Ht 5\' 6"  (1.676 m)   Wt 162 lb 3.2 oz (73.6 kg)   LMP 08/18/1994 (Approximate)   SpO2 94%   BMI 26.18 kg/m  , BMI Body mass index is 26.18 kg/m. GEN: Well nourished, well developed, in no acute distress  HEENT: normal  Neck: no JVD, carotid bruits, or masses Cardiac: RRR; no murmurs, rubs, or gallops,no edema  Respiratory:  clear to auscultation bilaterally, normal work of breathing GI: soft, nontender, nondistended, + BS MS: no deformity or atrophy  Skin: warm and dry, no rash Neuro:  Strength and sensation are intact Psych: euthymic mood, full affect   EKG:   The ekg ordered today demonstrates NSR, no ST changes   Recent Labs: 09/14/2019: ALT 10; BUN 18; Creat 0.53; Potassium 4.2; Sodium 142   Lipid Panel No results found for: CHOL, TRIG, HDL, CHOLHDL, VLDL, LDLCALC, LDLDIRECT   Other studies Reviewed: Additional studies/ records that were reviewed today with results demonstrating: labs reviewed; Cr. normal.   ASSESSMENT AND PLAN:  1. AFib: Paroxysmal.  Check echo to look for structural heart disease.  Call in metoprolol 25 mg PO BID prn. If sx get more frequent, would consider daily metoprolol or antiarrhythmic medicine.  2. Anticoagulated: No bleeding issues.  Normal renal function.   3. On estrogen replacement or osteoporosis. 4. She has had her first two COVID vaccines.     Current medicines are reviewed at length with the patient today.  The patient concerns regarding her medicines were addressed.  The following changes have been made:  No  change  Labs/ tests ordered today include:  No orders of the defined types were placed in this encounter.   Recommend 150 minutes/week of aerobic exercise Low fat, low carb, high fiber diet recommended  Disposition:   FU in 6 months.   SignedLarae Grooms, MD  05/29/2020 9:00 AM    [address], [place], [place]  [address] Phone: [phone]; Fax: [phone]

## 2020-06-02 DIAGNOSIS — Z23 Encounter for immunization: Secondary | ICD-10-CM | POA: Diagnosis not present

## 2020-06-11 ENCOUNTER — Other Ambulatory Visit: Payer: Self-pay | Admitting: Pharmacist

## 2020-06-11 MED ORDER — XARELTO 20 MG PO TABS: 20.0000 mg | ORAL_TABLET | Freq: Every day | ORAL | 1 refills | Status: DC

## 2020-06-22 DIAGNOSIS — R7989 Other specified abnormal findings of blood chemistry: Secondary | ICD-10-CM | POA: Diagnosis not present

## 2020-06-22 DIAGNOSIS — E041 Nontoxic single thyroid nodule: Secondary | ICD-10-CM | POA: Diagnosis not present

## 2020-06-22 DIAGNOSIS — M859 Disorder of bone density and structure, unspecified: Secondary | ICD-10-CM | POA: Diagnosis not present

## 2020-06-25 ENCOUNTER — Other Ambulatory Visit: Payer: Self-pay

## 2020-06-25 ENCOUNTER — Ambulatory Visit (HOSPITAL_COMMUNITY): Payer: PPO | Attending: Cardiology

## 2020-06-25 DIAGNOSIS — I4891 Unspecified atrial fibrillation: Secondary | ICD-10-CM | POA: Diagnosis not present

## 2020-06-25 DIAGNOSIS — R002 Palpitations: Secondary | ICD-10-CM

## 2020-06-25 LAB — ECHOCARDIOGRAM COMPLETE
Area-P 1/2: 3.65 cm2
S' Lateral: 2.5 cm

## 2020-06-29 DIAGNOSIS — R82998 Other abnormal findings in urine: Secondary | ICD-10-CM | POA: Diagnosis not present

## 2020-06-29 DIAGNOSIS — I4891 Unspecified atrial fibrillation: Secondary | ICD-10-CM | POA: Diagnosis not present

## 2020-06-29 DIAGNOSIS — M810 Age-related osteoporosis without current pathological fracture: Secondary | ICD-10-CM | POA: Diagnosis not present

## 2020-06-29 DIAGNOSIS — Z0000 Encounter for general adult medical examination without abnormal findings: Secondary | ICD-10-CM | POA: Diagnosis not present

## 2020-06-29 DIAGNOSIS — D692 Other nonthrombocytopenic purpura: Secondary | ICD-10-CM | POA: Diagnosis not present

## 2020-06-29 DIAGNOSIS — D649 Anemia, unspecified: Secondary | ICD-10-CM | POA: Diagnosis not present

## 2020-06-29 DIAGNOSIS — D6869 Other thrombophilia: Secondary | ICD-10-CM | POA: Diagnosis not present

## 2020-07-09 ENCOUNTER — Telehealth: Payer: Self-pay | Admitting: Interventional Cardiology

## 2020-07-09 NOTE — Telephone Encounter (Signed)
Cleon Gustin, RN  07/09/2020 12:31 PM EST Back to Top    The patient has been notified of the result and verbalized understanding. Patient states that her husband says that she does not snore and she has not had any observed apnea. All questions (if any) were answered. Cleon Gustin, RN 07/09/2020 12:30 PM    Cleon Gustin, RN  07/09/2020 9:21 AM EST     Left message for patient to call back.   Jettie Booze, MD  07/05/2020 10:05 PM EST     If she has observed apnea and snoring noted by someone who has seen her sleep, then can check sleep study as it could be related to AFib.    Michaelyn Barter, RN  06/25/2020 1:27 PM EST     The patient has been notified of the result and verbalized understanding. All questions (if any) were answered. [place], RN 06/25/2020 1:23 PM   Patient stated she takes a 15 minute nap daily and she has done this all her life. Patient stated her brother does the same thing. Patient stated she does not know if she has sleep apnea, but she has no problem going to sleep. Patient stated she can sleep sitting up. Donneta Romberg, MD  06/25/2020 12:52 PM EST     Normal LV function and normal valvular function. WOuld ask about any observed apnea or daytime somnolence as sleep apnea could be related to AFib and mildly increased pulmonary artery pressures.

## 2020-07-09 NOTE — Telephone Encounter (Signed)
Patient returning Brittany's call about her Echo results

## 2020-07-16 ENCOUNTER — Ambulatory Visit: Payer: PPO

## 2020-07-19 ENCOUNTER — Ambulatory Visit
Admission: RE | Admit: 2020-07-19 | Discharge: 2020-07-19 | Disposition: A | Discharge status: Home / Self Care | Payer: PPO | Source: Ambulatory Visit | Attending: Internal Medicine | Admitting: Internal Medicine

## 2020-07-19 ENCOUNTER — Other Ambulatory Visit: Payer: Self-pay

## 2020-07-19 DIAGNOSIS — Z1231 Encounter for screening mammogram for malignant neoplasm of breast: Secondary | ICD-10-CM

## 2020-07-20 ENCOUNTER — Ambulatory Visit: Payer: PPO | Admitting: Obstetrics & Gynecology

## 2020-08-02 DIAGNOSIS — Z1212 Encounter for screening for malignant neoplasm of rectum: Secondary | ICD-10-CM | POA: Diagnosis not present

## 2020-08-22 DIAGNOSIS — D1722 Benign lipomatous neoplasm of skin and subcutaneous tissue of left arm: Secondary | ICD-10-CM | POA: Diagnosis not present

## 2020-08-22 DIAGNOSIS — D2262 Melanocytic nevi of left upper limb, including shoulder: Secondary | ICD-10-CM | POA: Diagnosis not present

## 2020-08-22 DIAGNOSIS — D485 Neoplasm of uncertain behavior of skin: Secondary | ICD-10-CM | POA: Diagnosis not present

## 2020-08-22 DIAGNOSIS — Z86018 Personal history of other benign neoplasm: Secondary | ICD-10-CM | POA: Diagnosis not present

## 2020-08-22 DIAGNOSIS — L578 Other skin changes due to chronic exposure to nonionizing radiation: Secondary | ICD-10-CM | POA: Diagnosis not present

## 2020-08-22 DIAGNOSIS — D225 Melanocytic nevi of trunk: Secondary | ICD-10-CM | POA: Diagnosis not present

## 2020-08-22 DIAGNOSIS — L821 Other seborrheic keratosis: Secondary | ICD-10-CM | POA: Diagnosis not present

## 2020-08-22 DIAGNOSIS — Z808 Family history of malignant neoplasm of other organs or systems: Secondary | ICD-10-CM | POA: Diagnosis not present

## 2020-08-29 DIAGNOSIS — Z6826 Body mass index (BMI) 26.0-26.9, adult: Secondary | ICD-10-CM | POA: Diagnosis not present

## 2020-08-29 DIAGNOSIS — N952 Postmenopausal atrophic vaginitis: Secondary | ICD-10-CM | POA: Diagnosis not present

## 2020-08-29 DIAGNOSIS — Z124 Encounter for screening for malignant neoplasm of cervix: Secondary | ICD-10-CM | POA: Diagnosis not present

## 2020-08-29 DIAGNOSIS — N950 Postmenopausal bleeding: Secondary | ICD-10-CM | POA: Diagnosis not present

## 2020-10-01 ENCOUNTER — Telehealth: Payer: Self-pay | Admitting: *Deleted

## 2020-10-01 DIAGNOSIS — I4891 Unspecified atrial fibrillation: Secondary | ICD-10-CM

## 2020-10-01 NOTE — Telephone Encounter (Signed)
   [facility] Pre-operative Risk Assessment    HEARTCARE STAFF: - Please ensure there is not already an duplicate clearance open for this procedure. - Under Visit Info/Reason for Call, type in Other and utilize the format Clearance MM/DD/YY or Clearance TBD. Do not use dashes or single digits. - If request is for dental extraction, please clarify the # of teeth to be extracted.  Request for surgical clearance:  1. What type of surgery is being performed? Hysteroscopy dilation and curettage   2. When is this surgery scheduled? 10/11/2020   3. What type of clearance is required (medical clearance vs. Pharmacy clearance to hold med vs. Both)? Both  4. Are there any medications that need to be held prior to surgery and how long?Xarelto   5. Practice name and name of physician performing surgery? [facility], Dian Queen, MD   6. What is the office phone number? [phone]   7.   What is the office fax number? [phone] Attn surgery scheduler  8.   Anesthesia type (None, local, MAC, general) ? Propofol   Ann Vang L 10/01/2020, 3:27 PM  _________________________________________________________________   (provider comments below)

## 2020-10-02 DIAGNOSIS — I4891 Unspecified atrial fibrillation: Secondary | ICD-10-CM | POA: Insufficient documentation

## 2020-10-02 DIAGNOSIS — I480 Paroxysmal atrial fibrillation: Secondary | ICD-10-CM | POA: Insufficient documentation

## 2020-10-02 NOTE — Telephone Encounter (Signed)
Patient with diagnosis of afib on Xarelto for anticoagulation.    Procedure: Hysteroscopy dilation and curettage   Date of procedure: 10/11/20  CHA2DS2-VASc Score = 3  This indicates a 3.2% annual risk of stroke. The patient's score is based upon: CHF History: No HTN History: Yes Diabetes History: No Stroke History: No Vascular Disease History: No Age Score: 1 Gender Score: 1  CrCl 49mL/min Platelet count 194K  Per office protocol, patient can hold Xarelto for 2 days prior to procedure.

## 2020-10-02 NOTE — Telephone Encounter (Signed)
Patient returning call.

## 2020-10-02 NOTE — Telephone Encounter (Signed)
   Primary Cardiologist: [name], MD  Chart reviewed as part of pre-operative protocol coverage.  Hx: Paroxysmal AFib, thyroid nodules, osteoporosis s/p prior thoracic and lumbar vertebral compression fractures, HRT.  She established with Dr. [name] in 05/2020.    Echocardiogram 11/21: EF 65-70, RVSP 41.2.  No sig valve dz.  RCRI:  Perioperative Risk of Major Cardiac Event is (%): 0.4 (low risk)  Patient was contacted 10/02/2020 in reference to pre-operative risk assessment for pending surgery as outlined below. Since last seen, she has done well without chest pain, shortness of breath.  She is very active and exercises 6 times a week without difficulty.  Recommendations:  Therefore, based on ACC/AHA guidelines, the patient would be at acceptable risk for the planned procedure without further cardiovascular testing.    She may hold her rivaroxaban (Xarelto) for 2 days prior to the procedure.  This should be resumed postoperatively as soon as it is felt to be safe from a bleeding perspective.  Please call with questions. Richardson Dopp, PA-C 10/02/2020, 9:07 AM

## 2020-10-02 NOTE — Telephone Encounter (Signed)
Notes faxed to surgeon. This phone note will be removed from the preop pool. Richardson Dopp, PA-C  10/02/2020 11:01 AM

## 2020-10-11 ENCOUNTER — Telehealth: Payer: Self-pay | Admitting: Interventional Cardiology

## 2020-10-11 DIAGNOSIS — Z79899 Other long term (current) drug therapy: Secondary | ICD-10-CM

## 2020-10-11 DIAGNOSIS — I4891 Unspecified atrial fibrillation: Secondary | ICD-10-CM

## 2020-10-11 MED ORDER — AMIODARONE HCL 400 MG PO TABS: ORAL_TABLET | ORAL | 6 refills | Status: DC

## 2020-10-11 NOTE — Telephone Encounter (Signed)
Could start Amiodarone 400 g BID x 2 weeks and the 400 mg daily to maintain NSR.  If she is ok with this, will need TSH and LFTs in 6 months.  JV

## 2020-10-11 NOTE — Telephone Encounter (Signed)
I spoke with patient.  She went for procedure today and heart rate was elevated up to 130 so procedure was canceled.  Patient does not know if it was afib.  She reports her watch showed one episode of afib last night but then she was back in SR this morning.  Currently heart rate is 68 and regular.  Patient reports they are going to try procedure again in a few weeks.  Patient concerned about not being able to have procedure due to possible recurrence of afib. Patient reports Dr [name] discussed possibility of ablation and patient is now interested in this.

## 2020-10-11 NOTE — Telephone Encounter (Signed)
Patient c/o Palpitations:  High priority if patient c/o lightheadedness, shortness of breath, or chest pain  1) How long have you had palpitations/irregular HR/ Afib? Are you having the symptoms now? started today earlier, no longer having symptoms  2) Are you currently experiencing lightheadedness, SOB or CP? no  3) Do you have a history of afib (atrial fibrillation) or irregular heart rhythm? afib  4) Have you checked your BP or HR? (document readings if available): during anesthesia HR went to 100-110 then jumped to 130, now it is around 68  5) Are you experiencing any other symptoms? No   Patient states she was supposed to have a procedure today, but when she was given anesthesia her HR got up to 130. She states she did not feel her HR racing and that she held her xarelto like she was told to for 2 days prior. She states her HR is now 68. She is concerned about whether she will be able to have any procedures and anesthesia in the future.

## 2020-10-11 NOTE — Telephone Encounter (Signed)
Patient notified. She would like to start amiodarone.  Prescription sent to [facility].  She will come in for lab work on August 24,2022

## 2020-11-07 NOTE — Telephone Encounter (Signed)
Ann Vang from [facility] called and needed cardiac clearance  Faxed again [facility]

## 2020-11-07 NOTE — Telephone Encounter (Signed)
Ann Vang from [facility] states she still has not received the clearance. Fax: [phone]

## 2020-11-07 NOTE — Telephone Encounter (Signed)
Faxed again

## 2020-11-08 DIAGNOSIS — N8501 Benign endometrial hyperplasia: Secondary | ICD-10-CM | POA: Diagnosis not present

## 2020-11-08 DIAGNOSIS — N950 Postmenopausal bleeding: Secondary | ICD-10-CM | POA: Diagnosis not present

## 2020-11-08 DIAGNOSIS — N840 Polyp of corpus uteri: Secondary | ICD-10-CM | POA: Diagnosis not present

## 2020-11-08 NOTE — Telephone Encounter (Signed)
   Primary Cardiologist: [name], MD  Chart reviewed as part of pre-operative protocol coverage. Given past medical history and time since last visit, based on ACC/AHA guidelines, Ann Vang would be at acceptable risk for the planned procedure without further cardiovascular testing.  Patient was contacted by my colleague previously for preop clearance.  She was cleared from cardiac perspective to proceed with GYN procedure on anesthesia.  She does not have a prior history of CAD.  She does however has a history of atrial fibrillation. Echocardiogram obtained on 06/25/2020 showed normal ejection fraction without significant valve disease.  We have previously cleared the patient to hold Xarelto for 2 days prior to the procedure.  Cardiology service was contacted again this morning to resend cardiac clearance, from the cardiac perspective, we recommend continue with the procedure.  Please verify with the patient that she has held the medication for 2 days.  I will route this recommendation to the requesting party via Epic fax function and remove from pre-op pool.  Please call with questions.  [place], [place] 11/08/2020, 11:09 AM

## 2020-11-15 MED ORDER — AMIODARONE HCL 400 MG PO TABS: 400.0000 mg | ORAL_TABLET | Freq: Every day | ORAL | 0 refills | Status: DC

## 2020-11-27 NOTE — Progress Notes (Signed)
Cardiology Office Note   Date:  11/29/2020   ID:  Ann Vang, DOB 1946-03-14, MRN [id number]  PCP:  Ann Hatchet, MD    No chief complaint on file.  PAF  Wt Readings from Last 3 Encounters:  11/29/20 163 lb (73.9 kg)  05/29/20 162 lb 3.2 oz (73.6 kg)  05/10/19 157 lb 12.8 oz (71.6 kg)       History of Present Illness: Ann Vang is a 75 y.o. female   who is being seen today for the evaluation of AFib at the request of Ann Hatchet, MD.  She has had palpitations since May 2021.  Apple watch showed AFib.  Confirmed by monitor in July 2021.  She was started on Xarelto at that time.  No bleeding issues on Xarelto.  No bruising issues.  In early 2022, she needed surgery but she was in atrial fibrillation when she showed up for the procedure.  It was canceled.  We started amiodarone in February 2022 to help her maintain normal sinus rhythm.  She was able to have her D&C procedure done.  Due to some bleeding, she held her Xarelto for an extra couple of days.  Initially, we considered using the amiodarone just around the time of surgery.  Since her symptoms were better controlled,  she decided to continue the medication.    She had been doing well.  She had been on the Xarelto for a few weeks and started bleeding yesterday.  She had amio since the procedure.  She wants to decrease Xarelto and amio.    Denies : Chest pain. Dizziness. Leg edema. Nitroglycerin use. Orthopnea. Palpitations. Paroxysmal nocturnal dyspnea. Shortness of breath. Syncope.   Past Medical History:  Diagnosis Date  . Abnormal breast finding 12/1998   left breast abnl  . Arthritis    --basal joint--right hand  . Carpal tunnel syndrome, right   . Compression fracture of T2 vertebra ([place]) 05/10/2019  . Diverticulosis 2003   mild  . Lumbar compression fracture ([place]) 07/13/2012  . Lumbar disc disorder 2000   bulging  . Melena   . Menopause   . Osteoporosis    Fx L4 age 87 water skiing  accident  . Palpitations   . Thyroid disease 10/2014   thyroid nodules--bx'd large nodule and was benign--sees Gen. surgeon with [facility]    Past Surgical History:  Procedure Laterality Date  . AUGMENTATION MAMMAPLASTY  1983   Implants have been removed  . BREAST SURGERY  2010   implants removed ruptured  . CERVICAL BIOPSY  W/ LOOP ELECTRODE EXCISION  1996   [facility]  . IR EMBO TUMOR ORGAN ISCHEMIA INFARCT INC GUIDE ROADMAPPING  05/13/2017  . IR RADIOLOGIST EVAL & MGMT  04/15/2017  . IR RADIOLOGIST EVAL & MGMT  05/27/2017  . IR RADIOLOGIST EVAL & MGMT  01/21/2018  . IR RADIOLOGIST EVAL & MGMT  09/28/2019  . IR RENAL SUPRASEL UNI S&I MOD SED  05/13/2017  . IR US GUIDE VASC ACCESS LEFT  05/13/2017  . KNEE SURGERY  2012   meniscus rt knee (both)  . L-2 fracture  2009   fall in aerobics  . L-4 fracture   water skiing    . TENOSYNOVECTOMY Right 03/11/2016   Procedure: TENOSYNOVECTOMY FLEXOR RIGHT RING FINGER;  Surgeon: Daryll Brod, MD;  Location: [place];  Service: Orthopedics;  Laterality: Right;  . thyroid nodule biopsy  10/2014   --[facility], [place]--  . TONSILLECTOMY AND ADENOIDECTOMY  1964     Current Outpatient Medications  Medication Sig Dispense Refill  . amiodarone (PACERONE) 400 MG tablet Take 1 tablet (400 mg total) by mouth daily. 90 tablet 0  . Bacillus Coagulans-Inulin (PROBIOTIC) 1-250 BILLION-MG CAPS Take by mouth.    . Cholecalciferol (VITAMIN D) 2000 UNITS tablet Take 2,000 Units by mouth daily.    Marland Kitchen estradiol (VIVELLE-DOT) 0.05 MG/24HR patch PLACE ONE PATCH ONTO THE SKIN TWO TIMES A WEEK 24 patch 0  . fluticasone (FLONASE) 50 MCG/ACT nasal spray Place 2 sprays into the nose daily. 48 g 1  . magnesium gluconate (MAGONATE) 500 MG tablet Take 500 mg by mouth 2 (two) times daily.    . milk thistle 175 MG tablet Take 175 mg by mouth daily.    . progesterone (PROMETRIUM) 100 MG capsule TAKE ONE CAPSULE BY MOUTH DAILY 90 capsule 0  . vitamin k 100 MCG  tablet Take 300 mcg by mouth daily.    Alveda Reasons 20 MG TABS tablet Take 1 tablet (20 mg total) by mouth daily with supper. 90 tablet 1   No current facility-administered medications for this visit.    Allergies:   Patient has no known allergies.    Social History:  The patient  reports that she quit smoking about 47 years ago. She has never used smokeless tobacco. She reports current alcohol use of about 14.0 standard drinks of alcohol per week. She reports that she does not use drugs.   Family History:  The patient's family history includes Alzheimer's disease in her mother; Colon cancer in her maternal grandmother and another family member; Heart attack in her father; Hypertension in her father; Osteoporosis in her maternal grandmother and mother.    ROS:  Please see the history of present illness.   Otherwise, review of systems are positive for bleeding after D&C when she initially started Xarelto again.   All other systems are reviewed and negative.    PHYSICAL EXAM: VS:  BP (!) 148/68   Pulse 65   Ht 5\' 6"  (1.676 m)   Wt 163 lb (73.9 kg)   LMP 08/18/1994 (Approximate)   BMI 26.31 kg/m  , BMI Body mass index is 26.31 kg/m. GEN: Well nourished, well developed, in no acute distress  HEENT: normal  Neck: no JVD, carotid bruits, or masses Cardiac: RRR; no murmurs, rubs, or gallops,no edema  Respiratory:  clear to auscultation bilaterally, normal work of breathing GI: soft, nontender, nondistended, + BS MS: no deformity or atrophy  Skin: warm and dry, no rash Neuro:  Strength and sensation are intact Psych: euthymic mood, full affect   EKG:   The ekg ordered today demonstrates NSR, no ST changes   Recent Labs: No results found for requested labs within last 8760 hours.   Lipid Panel No results found for: CHOL, TRIG, HDL, CHOLHDL, VLDL, LDLCALC, LDLDIRECT   Other studies Reviewed: Additional studies/ records that were reviewed today with results demonstrating: prior  labs reviewed, LDL 77 in 11/21.   ASSESSMENT AND PLAN:  1. PAF: In NSR.  Amiodarone is maintaining the normal rhythm.  No symptoms at all of any palpitations.  Decrease Amio to 200 mg daily. Plan for labs in May 2022. Questions about Amio answered.   Chest x-ray in May 2022 as well.  Routine monitoring will be important to make sure she does not develop any side effects from the amiodarone. 2. Anticoagulated: No bleeding problems, other than light GYN bleeding in the last 2 days.  Not heavy.  She will call her GYN MD. We will see based on Dr. Dwyane Dee recommendations whether we need to hold Xarelto for a few days. 3. Was On estrogen for for osteoporosis.     Current medicines are reviewed at length with the patient today.  The patient concerns regarding her medicines were addressed.  The following changes have been made:  No change  Labs/ tests ordered today include: Labs and CXR. No orders of the defined types were placed in this encounter.   Recommend 150 minutes/week of aerobic exercise Low fat, low carb, high fiber diet recommended  Disposition:   FU in 6 months   Signed, [name], MD  11/29/2020 9:16 AM    [facility] [address], [place], [place]  [address] Phone: [phone]; Fax: [phone]

## 2020-11-29 ENCOUNTER — Encounter: Payer: Self-pay | Admitting: Interventional Cardiology

## 2020-11-29 ENCOUNTER — Ambulatory Visit: Payer: PPO | Admitting: Interventional Cardiology

## 2020-11-29 ENCOUNTER — Telehealth: Payer: Self-pay | Admitting: *Deleted

## 2020-11-29 ENCOUNTER — Other Ambulatory Visit: Payer: Self-pay

## 2020-11-29 VITALS — BP 148/68 | HR 65 | Ht 66.0 in | Wt 163.0 lb

## 2020-11-29 DIAGNOSIS — Z7901 Long term (current) use of anticoagulants: Secondary | ICD-10-CM | POA: Diagnosis not present

## 2020-11-29 DIAGNOSIS — Z79899 Other long term (current) drug therapy: Secondary | ICD-10-CM | POA: Diagnosis not present

## 2020-11-29 DIAGNOSIS — I4891 Unspecified atrial fibrillation: Secondary | ICD-10-CM | POA: Diagnosis not present

## 2020-11-29 MED ORDER — AMIODARONE HCL 200 MG PO TABS: 200.0000 mg | ORAL_TABLET | Freq: Every day | ORAL | 3 refills | Status: DC

## 2020-11-29 NOTE — Telephone Encounter (Signed)
I spoke with patient and gave her information regarding amiodarone.  She is taking Vitamin K2 MK-7 for osteoporosis.   She would like to know if it is OK for her to take Calcium

## 2020-11-29 NOTE — Telephone Encounter (Signed)
-----   Message from Jettie Booze, MD sent at 11/29/2020 12:00 PM EDT ----- Fraser Din, Please inform patient re: Amio and ask about Vit K.  Thanks.  JV ----- Message ----- From: Rollen Sox, [facility] Sent: 11/29/2020  10:42 AM EDT To: Dian Queen, MD, Jettie Booze, MD  There shouldn't be any significant interactions with her amiodarone and her other medications.  Do we know why she is taking Vitamin K every day however?  ----- Message ----- From: Jettie Booze, MD Sent: 11/29/2020  10:00 AM EDT To: Dian Queen, MD, Rollen Sox, [facility]  Gerald Stabs, Any issues with her meds and amiodarone.  Dr. Helane Rima, Please let us know if you think Xarelto needs to be stopped for a few days due to bleedig.  I think it would be ok. Thanks. Ulice Dash

## 2020-11-29 NOTE — Patient Instructions (Addendum)
Medication Instructions:  Your physician has recommended you make the following change in your medication: Decrease amiodarone to 200 mg by mouth daily  *If you need a refill on your cardiac medications before your next appointment, please call your pharmacy*   Lab Work: Your physician recommends that you return for lab work on May 2,2022.  This is not fasting.  CBC, CMET and TSH.  The lab is open from 7:30 to 4:15  If you have labs (blood work) drawn today and your tests are completely normal, you will receive your results only by: Marland Kitchen MyChart Message (if you have MyChart) OR . A paper copy in the mail If you have any lab test that is abnormal or we need to change your treatment, we will call you to review the results.   Testing/Procedures: A chest x-ray takes a picture of the organs and structures inside the chest, including the heart, lungs, and blood vessels. This test can show several things, including, whether the heart is enlarges; whether fluid is building up in the lungs; and whether pacemaker / defibrillator leads are still in place.  Have done in early May at [place]: At [facility], you and your health needs are our priority.  As part of our continuing mission to provide you with exceptional heart care, we have created designated Provider Care Teams.  These Care Teams include your primary Cardiologist (physician) and Advanced Practice Providers (APPs -  Physician Assistants and Nurse Practitioners) who all work together to provide you with the care you need, when you need it.  We recommend signing up for the patient portal called "MyChart".  Sign up information is provided on this After Visit Summary.  MyChart is used to connect with patients for Virtual Visits (Telemedicine).  Patients are able to view lab/test results, encounter notes, upcoming appointments, etc.  Non-urgent messages can be sent to your provider as well.   To learn more  about what you can do with MyChart, go to [email or web address].    Your next appointment:   6 month(s)  The format for your next appointment:   In Person  Provider:   You may see [name], MD or one of the following Advanced Practice Providers on your designated Care Team:    Melina Copa, PA-C  Ermalinda Barrios, PA-C    Other Instructions

## 2020-11-30 NOTE — Telephone Encounter (Signed)
Yes, should not be a problem

## 2020-12-03 ENCOUNTER — Ambulatory Visit: Payer: PPO | Admitting: Podiatry

## 2020-12-03 ENCOUNTER — Ambulatory Visit: Payer: Self-pay

## 2020-12-03 ENCOUNTER — Other Ambulatory Visit: Payer: Self-pay

## 2020-12-03 DIAGNOSIS — M2042 Other hammer toe(s) (acquired), left foot: Secondary | ICD-10-CM | POA: Diagnosis not present

## 2020-12-03 DIAGNOSIS — G5762 Lesion of plantar nerve, left lower limb: Secondary | ICD-10-CM

## 2020-12-03 DIAGNOSIS — M2041 Other hammer toe(s) (acquired), right foot: Secondary | ICD-10-CM | POA: Diagnosis not present

## 2020-12-03 DIAGNOSIS — L84 Corns and callosities: Secondary | ICD-10-CM

## 2020-12-03 DIAGNOSIS — Z7901 Long term (current) use of anticoagulants: Secondary | ICD-10-CM

## 2020-12-03 NOTE — Progress Notes (Signed)
Subjective:   Patient ID: Ann Vang, female   DOB: 75 y.o.   MRN: [id number]   HPI 75 year old female presents the office today for concerns of a painful lesion on her left fourth toe.  She has noticed a small "knot" or callus to the area.  No recent injury or trauma.  No swelling or redness.  Also she has hammertoes most on her right second toe but she has not want to consider surgery at this time.  No significant discomfort to the toe at this time.  No recent treatment otherwise.  No other concerns.  She is on Xeralto for A-fib.   Review of Systems  All other systems reviewed and are negative.  Past Medical History:  Diagnosis Date  . Abnormal breast finding 12/1998   left breast abnl  . Arthritis    --basal joint--right hand  . Carpal tunnel syndrome, right   . Compression fracture of T2 vertebra ([place]) 05/10/2019  . Diverticulosis 2003   mild  . Lumbar compression fracture ([place]) 07/13/2012  . Lumbar disc disorder 2000   bulging  . Melena   . Menopause   . Osteoporosis    Fx L4 age 71 water skiing accident  . Palpitations   . Thyroid disease 10/2014   thyroid nodules--bx'd large nodule and was benign--sees Gen. surgeon with [facility]    Past Surgical History:  Procedure Laterality Date  . AUGMENTATION MAMMAPLASTY  1983   Implants have been removed  . BREAST SURGERY  2010   implants removed ruptured  . CERVICAL BIOPSY  W/ LOOP ELECTRODE EXCISION  1996   CIN  . IR EMBO TUMOR ORGAN ISCHEMIA INFARCT INC GUIDE ROADMAPPING  05/13/2017  . IR RADIOLOGIST EVAL & MGMT  04/15/2017  . IR RADIOLOGIST EVAL & MGMT  05/27/2017  . IR RADIOLOGIST EVAL & MGMT  01/21/2018  . IR RADIOLOGIST EVAL & MGMT  09/28/2019  . IR RENAL SUPRASEL UNI S&I MOD SED  05/13/2017  . IR US GUIDE VASC ACCESS LEFT  05/13/2017  . KNEE SURGERY  2012   meniscus rt knee (both)  . L-2 fracture  2009   fall in aerobics  . L-4 fracture   water skiing    . TENOSYNOVECTOMY Right 03/11/2016   Procedure:  TENOSYNOVECTOMY FLEXOR RIGHT RING FINGER;  Surgeon: Daryll Brod, MD;  Location: [place];  Service: Orthopedics;  Laterality: Right;  . thyroid nodule biopsy  10/2014   --[facility], [place]--  . TONSILLECTOMY AND ADENOIDECTOMY  1964     Current Outpatient Medications:  .  amiodarone (PACERONE) 200 MG tablet, Take 1 tablet (200 mg total) by mouth daily., Disp: 90 tablet, Rfl: 3 .  Bacillus Coagulans-Inulin (PROBIOTIC) 1-250 BILLION-MG CAPS, Take by mouth., Disp: , Rfl:  .  Cholecalciferol (VITAMIN D) 2000 UNITS tablet, Take 2,000 Units by mouth daily., Disp: , Rfl:  .  estradiol (VIVELLE-DOT) 0.05 MG/24HR patch, PLACE ONE PATCH ONTO THE SKIN TWO TIMES A WEEK, Disp: 24 patch, Rfl: 0 .  fluticasone (FLONASE) 50 MCG/ACT nasal spray, Place 2 sprays into the nose daily., Disp: 48 g, Rfl: 1 .  magnesium gluconate (MAGONATE) 500 MG tablet, Take 500 mg by mouth 2 (two) times daily., Disp: , Rfl:  .  milk thistle 175 MG tablet, Take 175 mg by mouth daily., Disp: , Rfl:  .  progesterone (PROMETRIUM) 100 MG capsule, TAKE ONE CAPSULE BY MOUTH DAILY, Disp: 90 capsule, Rfl: 0 .  vitamin k 100 MCG tablet, Take 300  mcg by mouth daily., Disp: , Rfl:  .  XARELTO 20 MG TABS tablet, Take 1 tablet (20 mg total) by mouth daily with supper., Disp: 90 tablet, Rfl: 1  No Known Allergies       Objective:  Physical Exam  General: AAO x3, NAD  Dermatological: Hyperkeratotic lesion of the distal aspect of the right plantar lateral fourth toe.  No underlying ulceration drainage or any signs of infection.  There is no open lesions.  No edema, erythema.  Vascular: Dorsalis Pedis artery and Posterior Tibial artery pedal pulses are 2/4 bilateral with immedate capillary fill time. There is no pain with calf compression, swelling, warmth, erythema.   Neruologic: Grossly intact via light touch bilateral.   Musculoskeletal: Semirigid hammertoe contracture present on the right second toe but no  discomfort identified at the toe today.  Adductovarus present left fourth toe.  Muscular strength 5/5 in all groups tested bilateral.  Gait: Unassisted, Nonantalgic.       Assessment:   Hyperkeratotic lesion due to digital deformity left foot, hammertoes     Plan:  -Treatment options discussed including all alternatives, risks, and complications -Etiology of symptoms were discussed -Sharply debrided the hyperkeratotic lesion left fourth toe without any complications or bleeding. -Dispensed various offloading pads for the hammertoe as well as the adductovarus of the left fourth toe.  Also discussed shoe modifications.  Trula Slade DPM

## 2020-12-03 NOTE — Patient Instructions (Signed)
https://orthoinfo.aaos.org/en/diseases--conditions/hammer-toe">  Hammer Toe Hammer toe is a change in the shape, or a deformity, of the toe. The deformity causes the middle joint of the toe to stay bent. Hammer toe starts gradually. At first, the toe can be straightened. Then over time, the toe deformity becomes stiff, inflexible, and permanently bent. Hammer toe usually affects the second, third, or fourth toe. A hammer toe causes pain, especially when wearing shoes. Corns and calluses can result from the toe rubbing against the inside of the shoe. Early treatments to keep the toe straight may relieve pain. As the deformity of the toe becomes stiff and permanent, surgery may be needed to straighten the toe. What are the causes? This condition is caused by abnormal bending of the toe joint that is closest to your foot. Over time, the toe bending downward pulls on the muscles and connections (tendons) of the toe joint, making them weak and stiff. Wearing shoes that are too narrow in the toe box and do not allow toes to fully straighten can cause this condition. What increases the risk? You are more likely to develop this condition if you:  Are an older female.  Wear shoes that are too small, or wear high-heeled shoes that pinch your toes.  Have a second toe that is longer than your big toe (first toe).  Injure your foot or toe.  Have arthritis, or have a nerve or muscle disorder.  Have diabetes or a condition known as Charcot joint, which may cause you to walk abnormally.  Have a family history of hammer toe.  Are a Engineer, mining. What are the signs or symptoms? Pain and deformity of the toe are the main symptoms of this condition. The pain is worse when wearing shoes, walking, or running. Other symptoms may include:  A thickened patch of skin, called a corn or callus, that forms over the top of the bent part of the toe or between the toes.  Redness and a burning feeling on the bent  toe.  An open sore that forms on the top of the bent toe.  Not being able to straighten the affected toe.   How is this diagnosed? This condition is diagnosed based on your symptoms and a physical exam. During the exam, your health care provider will try to straighten your toe to see how stiff the deformity is. You may also have tests, such as:  A blood test to check for rheumatoid arthritis or diabetes.  An X-ray to show how severe the toe deformity is. How is this treated? Treatment for this condition depends on whether the toe is flexible or deformed and no longer moveable. In less severe cases, a hammer toe can be straightened without surgery. These treatments include:  Taping the toe into a straightened position.  Using pads and cushions to protect the bent toe.  Wearing shoes that provide enough room for the toes.  Doing toe-stretching exercises at home.  Taking an NSAID, such as ibuprofen, to reduce pain and swelling.  Using special orthotics or insoles for pain relief and to improve walking. If these treatments do not help or the toe has a severe deformity and cannot be straightened, surgery is the next option. The most common surgeries used to straighten a hammer toe include:  Arthroplasty or osteotomy. Part of the toe joint is reconstructed or removed, which allows the toe to straighten.  Fusion. Cartilage between the two bones of the joint is taken out, and the bones are fused together  into one longer bone.  Implantation. Part of the bone is removed and replaced with an implant to allow the toe to move again.  Flexor tendon transfer. The tendons that curl the toes down (flexor tendons) are repositioned. Follow these instructions at home:  Take over-the-counter and prescription medicines only as told by your health care provider.  Do toe-straightening and stretching exercises as told by your health care provider.  Keep all follow-up visits. This is important. How is  this prevented?  Wear shoes that fit properly and give your toes enough room. Shoes should not cause pain.  Buy shoes at the end of the day to make sure they fit well, since your foot may swell during the day. Make sure they are comfortable before you buy them.  As you age, your shoe size might change, including the width. Measure both feet and buy shoes for the larger foot.  A shoe repair store might be able to stretch shoes that feel tight in spots.  Do not wear high-heeled shoes or shoes with pointed toes. Contact a health care provider if:  Your pain gets worse.  Your toe becomes red or swollen.  You develop an open sore on your toe. Summary  Hammer toe is a condition that gradually causes your toe to become bent and stiff.  Hammer toe can be treated by taping the toe into a straightened position and doing toe-stretching exercises. If these treatments do not help, surgery may be needed.  To prevent this condition, wear shoes that fit properly, give your toes enough room, and do not cause pain. This information is not intended to replace advice given to you by your health care provider. Make sure you discuss any questions you have with your health care provider. Document Revised: 11/10/2019 Document Reviewed: 11/10/2019 Elsevier Patient Education  2021 [place] are small areas of thickened skin that form on the top, sides, or tip of a toe. Corns have a cone-shaped core with a point that can press on a nerve below. This causes pain. Calluses are areas of thickened skin that can form anywhere on the body, including the hands, fingers, palms, soles of the feet, and heels. Calluses are usually larger than corns. What are the causes? Corns and calluses are caused by rubbing (friction) or pressure, such as from shoes that are too tight or do not fit properly. What increases the risk? Corns are more likely to develop in people who have misshapen toes  (toe deformities), such as hammer toes. Calluses can form with friction to any area of the skin. They are more likely to develop in people who:  Work with their hands.  Wear shoes that fit poorly, are too tight, or are high-heeled.  Have toe deformities. What are the signs or symptoms? Symptoms of a corn or callus include:  A hard growth on the skin.  Pain or tenderness under the skin.  Redness and swelling.  Increased discomfort while wearing tight-fitting shoes, if your feet are affected. If a corn or callus becomes infected, symptoms may include:  Redness and swelling that gets worse.  Pain.  Fluid, blood, or pus draining from the corn or callus.   How is this diagnosed? Corns and calluses may be diagnosed based on your symptoms, your medical history, and a physical exam. How is this treated? Treatment for corns and calluses may include:  Removing the cause of the friction or pressure. This may involve: ? Changing your  shoes. ? Wearing shoe inserts (orthotics) or other protective layers in your shoes, such as a corn pad. ? Wearing gloves.  Applying medicine to the skin (topical medicine) to help soften skin in the hardened, thickened areas.  Removing layers of dead skin with a file to reduce the size of the corn or callus.  Removing the corn or callus with a scalpel or laser.  Taking antibiotic medicines, if your corn or callus is infected.  Having surgery, if a toe deformity is the cause. Follow these instructions at home:  Take over-the-counter and prescription medicines only as told by your health care provider.  If you were prescribed an antibiotic medicine, take it as told by your health care provider. Do not stop taking it even if your condition improves.  Wear shoes that fit well. Avoid wearing high-heeled shoes and shoes that are too tight or too loose.  Wear any padding, protective layers, gloves, or orthotics as told by your health care  provider.  Soak your hands or feet. Then use a file or pumice stone to soften your corn or callus. Do this as told by your health care provider.  Check your corn or callus every day for signs of infection.   Contact a health care provider if:  Your symptoms do not improve with treatment.  You have redness or swelling that gets worse.  Your corn or callus becomes painful.  You have fluid, blood, or pus coming from your corn or callus.  You have new symptoms. Get help right away if:  You develop severe pain with redness. Summary  Corns are small areas of thickened skin that form on the top, sides, or tip of a toe. These can be painful.  Calluses are areas of thickened skin that can form anywhere on the body, including the hands, fingers, palms, and soles of the feet. Calluses are usually larger than corns.  Corns and calluses are caused by rubbing (friction) or pressure, such as from shoes that are too tight or do not fit properly.  Treatment may include wearing padding, protective layers, gloves, or orthotics as told by your health care provider. This information is not intended to replace advice given to you by your health care provider. Make sure you discuss any questions you have with your health care provider. Document Revised: 12/01/2019 Document Reviewed: 12/01/2019 Elsevier Patient Education  2021 [employer].

## 2020-12-04 DIAGNOSIS — N950 Postmenopausal bleeding: Secondary | ICD-10-CM | POA: Diagnosis not present

## 2020-12-04 NOTE — Telephone Encounter (Signed)
Ok to take calcium supplement.  JV

## 2020-12-04 NOTE — Telephone Encounter (Signed)
Ann Vang is returning [employer]. Please advise.

## 2020-12-04 NOTE — Telephone Encounter (Signed)
Left message to call back  

## 2020-12-04 NOTE — Telephone Encounter (Signed)
Spoke with pt and made her aware ok to take a Calcium supplement.    While on the phone, pt mentioned that the Amiodarone is doing great and keeping her out of AF.  Pt inquired about stopping Xarelto and only using if she goes back into AF.  Explained why this would not be appropriate.  Pt concerned due to the bleeding risks.  Pt verbalized understanding of the need to stay on Xarelto.

## 2020-12-10 ENCOUNTER — Telehealth: Payer: Self-pay | Admitting: Interventional Cardiology

## 2020-12-10 NOTE — Telephone Encounter (Signed)
Received mychart message from Pt advising she had not heard back from GYN.  Discussed with DOD.  No history of stroke.  Per DOD hold Xarelto tonight and tomorrow night.  Pt needs to follow with GYN to determine long term plan.  Advised Pt to hold Xarelto for 2 nights.  Asked her to follow up with [place] once she touches base with GYN.  Will forward to Dr. [name] and nurse for follow up.

## 2020-12-10 NOTE — Telephone Encounter (Signed)
Returned call to Pt.  Per Pt she has had increased vaginal bleeding.  She has bleed through her pads and blood in her bed when she wakes up in the morning.  Per Pt bleeding seems to be heavier at night (takes Xarelto with dinner).  She had called GYN last week and they had suggested reducing her estradiol.  She states this has not helped.  She has a call out to Dr. Dwyane Dee office.  She is awaiting call back.  She will send this nurse a mychart message when she receives advisement from Dr. Candelaria Celeste regarding her [place].  Will continue to  Monitor.

## 2020-12-10 NOTE — Telephone Encounter (Signed)
I agree with holding Xarelto for 2 days.   JV

## 2020-12-10 NOTE — Telephone Encounter (Signed)
Pt is calling with concerns she is bleeding uncontrollably.She stated it has happened before.Please advise

## 2020-12-12 ENCOUNTER — Telehealth: Payer: Self-pay | Admitting: *Deleted

## 2020-12-12 NOTE — Telephone Encounter (Signed)
Received call from Dr Christen Butter nurse.  She reports Dr Helane Rima is asking if patient could stay off Xarelto permanently or if dose could be lowered or changed to a different medication due to bleeding.  No structural reason has been found for bleeding. Xarelto was on hold 4/25 and 4/26.  Patient due to resume today  (see my chart messages)

## 2020-12-12 NOTE — Telephone Encounter (Signed)
Opened in error

## 2020-12-12 NOTE — Telephone Encounter (Signed)
We can try switching to Eliquis 5 mg BID as this may have a lower bleeding risk for her.  Avoid NSAIDs, aspirin products also.    She can hold Xarelto another two days before starting the Eliquis.    JV

## 2020-12-13 MED ORDER — ELIQUIS 5 MG PO TABS: 5.0000 mg | ORAL_TABLET | Freq: Two times a day (BID) | ORAL | 0 refills | Status: DC

## 2020-12-13 MED ORDER — ELIQUIS 5 MG PO TABS: 5.0000 mg | ORAL_TABLET | Freq: Two times a day (BID) | ORAL | 1 refills | Status: DC

## 2020-12-13 NOTE — Telephone Encounter (Signed)
Agree with rec to change to Eliquis 5mg  BID for lower bleed risk. She'll have cost savings on her insurance plan if Eliquis is sent in as a 3 month supply ($90) instead of 1 month ($45). She can also use 1 month free copay card. I have called pt to discuss this transition after 2 day Xarelto hold, she is in agreement with plan and new rx/copay card (ID [id number]) have been sent to pharmacy.

## 2020-12-17 ENCOUNTER — Other Ambulatory Visit: Payer: Self-pay

## 2020-12-17 ENCOUNTER — Ambulatory Visit
Admission: RE | Admit: 2020-12-17 | Discharge: 2020-12-17 | Disposition: A | Discharge status: Home / Self Care | Payer: PPO | Source: Ambulatory Visit | Attending: Interventional Cardiology | Admitting: Interventional Cardiology

## 2020-12-17 ENCOUNTER — Other Ambulatory Visit: Payer: PPO | Admitting: *Deleted

## 2020-12-17 DIAGNOSIS — I4891 Unspecified atrial fibrillation: Secondary | ICD-10-CM | POA: Diagnosis not present

## 2020-12-17 DIAGNOSIS — Z79899 Other long term (current) drug therapy: Secondary | ICD-10-CM

## 2020-12-17 DIAGNOSIS — R911 Solitary pulmonary nodule: Secondary | ICD-10-CM | POA: Diagnosis not present

## 2020-12-18 LAB — COMPREHENSIVE METABOLIC PANEL
ALT: 19 IU/L (ref 0–32)
AST: 25 IU/L (ref 0–40)
Albumin/Globulin Ratio: 2 (ref 1.2–2.2)
Albumin: 4.6 g/dL (ref 3.7–4.7)
Alkaline Phosphatase: 60 IU/L (ref 44–121)
BUN/Creatinine Ratio: 24 (ref 12–28)
BUN: 19 mg/dL (ref 8–27)
Bilirubin Total: 0.5 mg/dL (ref 0.0–1.2)
CO2: 24 mmol/L (ref 20–29)
Calcium: 9.4 mg/dL (ref 8.7–10.3)
Chloride: 101 mmol/L (ref 96–106)
Creatinine, Ser: 0.8 mg/dL (ref 0.57–1.00)
Globulin, Total: 2.3 g/dL (ref 1.5–4.5)
Glucose: 101 mg/dL — ABNORMAL HIGH (ref 65–99)
Potassium: 4 mmol/L (ref 3.5–5.2)
Sodium: 139 mmol/L (ref 134–144)
Total Protein: 6.9 g/dL (ref 6.0–8.5)
eGFR: 77 mL/min/{1.73_m2} (ref >59)

## 2020-12-18 LAB — CBC
Hematocrit: 42.9 % (ref 34.0–46.6)
Hemoglobin: 14.6 g/dL (ref 11.1–15.9)
MCH: 31.9 pg (ref 26.6–33.0)
MCHC: 34 g/dL (ref 31.5–35.7)
MCV: 94 fL (ref 79–97)
Platelets: 233 10*3/uL (ref 150–450)
RBC: 4.57 x10E6/uL (ref 3.77–5.28)
RDW: 13.1 % (ref 11.7–15.4)
WBC: 6.9 10*3/uL (ref 3.4–10.8)

## 2020-12-18 LAB — TSH: TSH: 2.56 u[IU]/mL (ref 0.450–4.500)

## 2021-02-01 DIAGNOSIS — H35373 Puckering of macula, bilateral: Secondary | ICD-10-CM | POA: Diagnosis not present

## 2021-02-01 DIAGNOSIS — H524 Presbyopia: Secondary | ICD-10-CM | POA: Diagnosis not present

## 2021-02-01 DIAGNOSIS — D3131 Benign neoplasm of right choroid: Secondary | ICD-10-CM | POA: Diagnosis not present

## 2021-02-01 DIAGNOSIS — H2513 Age-related nuclear cataract, bilateral: Secondary | ICD-10-CM | POA: Diagnosis not present

## 2021-04-10 ENCOUNTER — Other Ambulatory Visit: Payer: PPO

## 2021-05-25 DIAGNOSIS — Z23 Encounter for immunization: Secondary | ICD-10-CM | POA: Diagnosis not present

## 2021-05-27 DIAGNOSIS — N390 Urinary tract infection, site not specified: Secondary | ICD-10-CM | POA: Diagnosis not present

## 2021-05-27 DIAGNOSIS — R319 Hematuria, unspecified: Secondary | ICD-10-CM | POA: Diagnosis not present

## 2021-05-29 DIAGNOSIS — M170 Bilateral primary osteoarthritis of knee: Secondary | ICD-10-CM | POA: Diagnosis not present

## 2021-05-29 DIAGNOSIS — M1711 Unilateral primary osteoarthritis, right knee: Secondary | ICD-10-CM | POA: Diagnosis not present

## 2021-06-03 DIAGNOSIS — R319 Hematuria, unspecified: Secondary | ICD-10-CM | POA: Diagnosis not present

## 2021-06-19 ENCOUNTER — Other Ambulatory Visit: Payer: Self-pay | Admitting: Internal Medicine

## 2021-06-19 DIAGNOSIS — Z1231 Encounter for screening mammogram for malignant neoplasm of breast: Secondary | ICD-10-CM

## 2021-06-24 NOTE — Progress Notes (Signed)
Cardiology Office Note   Date:  06/25/2021   ID:  DELCENIA INMAN, DOB 1946/02/22, MRN [id number]  PCP:  Velna Hatchet, MD    No chief complaint on file.    Wt Readings from Last 3 Encounters:  06/25/21 162 lb 12.8 oz (73.8 kg)  11/29/20 163 lb (73.9 kg)  05/29/20 162 lb 3.2 oz (73.6 kg)       History of Present Illness: Ann Vang is a 75 y.o. female   who is being seen today for the evaluation of AFib at the request of Velna Hatchet, MD.   She has had palpitations since May 2021.  Apple watch showed AFib.  Confirmed by monitor in July 2021.  She was started on Xarelto at that time.  No bleeding issues on Xarelto.  No bruising issues.   In early 2022, she needed surgery but she was in atrial fibrillation when she showed up for the procedure.  It was canceled.  We started amiodarone in February 2022 to help her maintain normal sinus rhythm.  She was able to have her D&C procedure done.  Due to some bleeding, she held her Xarelto for an extra couple of days.  Initially, we considered using the amiodarone just around the time of surgery.  Since her symptoms were better controlled,  she decided to continue the medication.     She had been doing well on the Xarelto but started bleeding in April 2022.   She wanted to decrease Xarelto and amio.    In April 2022, we decreased her amiodarone to 200 mg daily.  We stressed the importance of routine monitoring in terms of blood work as well as chest x-ray.  We were going to see based on Dr. Felix Ahmadi recommendations whether we need to hold Xarelto.   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    She has been taking Eliquis without any bleeding issues. No AFib.  She wants to decrease Amio.  Denies : exertional Chest pain. Dizziness. Leg edema. Nitroglycerin use. Orthopnea. Palpitations. Paroxysmal nocturnal dyspnea. Shortness of breath. Syncope.    She walks, goes to gym and does weights and  elliptical.     Past Medical History:  Diagnosis Date   Abnormal breast finding 12/1998   left breast abnl   Arthritis    --basal joint--right hand   Carpal tunnel syndrome, right    Compression fracture of T2 vertebra ([place]) 05/10/2019   Diverticulosis 2003   mild   Lumbar compression fracture ([place]) 07/13/2012   Lumbar disc disorder 2000   bulging   Melena    Menopause    Osteoporosis    Fx L4 age 35 water skiing accident   Palpitations    Thyroid disease 10/2014   thyroid nodules--bx'd large nodule and was benign--sees Gen. Psychologist, sport and exercise with [facility]    Past Surgical History:  Procedure Laterality Date   [place]   Implants have been removed   BREAST SURGERY  2010   implants removed ruptured   CERVICAL BIOPSY  W/ LOOP ELECTRODE EXCISION  1996   CIN   IR EMBO TUMOR ORGAN ISCHEMIA INFARCT INC GUIDE ROADMAPPING  05/13/2017   IR RADIOLOGIST EVAL & MGMT  04/15/2017   IR RADIOLOGIST EVAL & MGMT  05/27/2017   IR RADIOLOGIST EVAL & MGMT  01/21/2018   IR RADIOLOGIST EVAL & MGMT  09/28/2019   IR RENAL SUPRASEL UNI S&I MOD SED  05/13/2017  IR US GUIDE VASC ACCESS LEFT  05/13/2017   KNEE SURGERY  2012   meniscus rt knee (both)   L-2 fracture  2009   fall in aerobics   L-4 fracture   water skiing     TENOSYNOVECTOMY Right 03/11/2016   Procedure: TENOSYNOVECTOMY FLEXOR RIGHT RING FINGER;  Surgeon: Daryll Brod, MD;  Location: [facility];  Service: Orthopedics;  Laterality: Right;   thyroid nodule biopsy  10/2014   --[facility], [place]--   TONSILLECTOMY AND ADENOIDECTOMY  1964     Current Outpatient Medications  Medication Sig Dispense Refill   amiodarone (PACERONE) 200 MG tablet Take 1 tablet (200 mg total) by mouth daily. 90 tablet 3   apixaban (ELIQUIS) 5 MG TABS tablet Take 1 tablet (5 mg total) by mouth 2 (two) times daily. 180 tablet 1   Bacillus Coagulans-Inulin (PROBIOTIC) 1-250 BILLION-MG CAPS Take by mouth.     Cholecalciferol (VITAMIN  D) 2000 UNITS tablet Take 2,000 Units by mouth daily.     estradiol (VIVELLE-DOT) 0.0375 MG/24HR Place onto the skin.     fluticasone (FLONASE) 50 MCG/ACT nasal spray Place 2 sprays into the nose daily. 48 g 1   magnesium gluconate (MAGONATE) 500 MG tablet Take 500 mg by mouth 2 (two) times daily.     milk thistle 175 MG tablet Take 175 mg by mouth daily.     progesterone (PROMETRIUM) 100 MG capsule TAKE ONE CAPSULE BY MOUTH DAILY 90 capsule 0   vitamin k 100 MCG tablet Take 300 mcg by mouth daily.     No current facility-administered medications for this visit.    Allergies:   Patient has no known allergies.    Social History:  The patient  reports that she quit smoking about 48 years ago. Her smoking use included cigarettes. She has never used smokeless tobacco. She reports current alcohol use of about 14.0 standard drinks per week. She reports that she does not use drugs.   Family History:  The patient's family history includes Alzheimer's disease in her mother; Colon cancer in her maternal grandmother and another family member; Heart attack in her father; Hypertension in her father; Osteoporosis in her maternal grandmother and mother.    ROS:  Please see the history of present illness.   Otherwise, review of systems are positive for rare hematuria.   All other systems are reviewed and negative.    PHYSICAL EXAM: VS:  BP (!) 142/80   Pulse 66   Ht 5\' 6"  (1.676 m)   Wt 162 lb 12.8 oz (73.8 kg)   LMP 08/18/1994 (Approximate)   SpO2 97%   BMI 26.28 kg/m  , BMI Body mass index is 26.28 kg/m. GEN: Well nourished, well developed, in no acute distress HEENT: normal Neck: no JVD, carotid bruits, or masses Cardiac: RRR; no murmurs, rubs, or gallops,no edema  Respiratory:  clear to auscultation bilaterally, normal work of breathing GI: soft, nontender, nondistended, + BS MS: no deformity or atrophy Skin: warm and dry, no rash Neuro:  Strength and sensation are intact Psych: euthymic  mood, full affect   EKG:   The ekg ordered today demonstrates NSR, nonspecific ST changes   Recent Labs: 12/17/2020: ALT 19; BUN 19; Creatinine, Ser 0.80; Hemoglobin 14.6; Platelets 233; Potassium 4.0; Sodium 139; TSH 2.560   Lipid Panel No results found for: CHOL, TRIG, HDL, CHOLHDL, VLDL, LDLCALC, LDLDIRECT   Other studies Reviewed: Additional studies/ records that were reviewed today with results demonstrating: labs reviewed.  ASSESSMENT AND PLAN:  PAF: Maintaining NSR.  Ok to decrease Amio to 100 mg daily.  Monitor liver, thyroid and CBC with current meds.  Anticoagulated:  Stable in Eliquis.  Hbg 14.6.  No significant bleeding noted.   Atypical chest pain: Just lasted seconds.  Not related to exertion.  She remains very active physically.  Continue to monitor.  Would not proceed with any cardiac testing at this point. Was previously on estrogen for osteoporosis.   Current medicines are reviewed at length with the patient today.  The patient concerns regarding her medicines were addressed.  The following changes have been made:  decrease Amio  Labs/ tests ordered today include:  No orders of the defined types were placed in this encounter.   Recommend 150 minutes/week of aerobic exercise Low fat, low carb, high fiber diet recommended  Disposition:   FU in 6 months   Signed, [name], MD  06/25/2021 1:40 PM    [facility] [place], [place], [place]  [address] Phone: [phone]; Fax: [phone]

## 2021-06-25 ENCOUNTER — Encounter: Payer: Self-pay | Admitting: Interventional Cardiology

## 2021-06-25 ENCOUNTER — Ambulatory Visit: Payer: PPO | Admitting: Interventional Cardiology

## 2021-06-25 ENCOUNTER — Other Ambulatory Visit: Payer: Self-pay

## 2021-06-25 VITALS — BP 142/80 | HR 66 | Ht 66.0 in | Wt 162.8 lb

## 2021-06-25 DIAGNOSIS — R0789 Other chest pain: Secondary | ICD-10-CM

## 2021-06-25 DIAGNOSIS — I4891 Unspecified atrial fibrillation: Secondary | ICD-10-CM | POA: Diagnosis not present

## 2021-06-25 DIAGNOSIS — Z7901 Long term (current) use of anticoagulants: Secondary | ICD-10-CM

## 2021-06-25 MED ORDER — AMIODARONE HCL 200 MG PO TABS: 100.0000 mg | ORAL_TABLET | Freq: Every day | ORAL | 3 refills | Status: DC

## 2021-06-25 NOTE — Patient Instructions (Signed)
Medication Instructions:  Your physician has recommended you make the following change in your medication: Decrease amiodarone to 100 mg by mouth daily (half of a 200 mg tablet)  *If you need a refill on your cardiac medications before your next appointment, please call your pharmacy*   Lab Work: Lab work to be done today--CBC, CMET and TSH If you have labs (blood work) drawn today and your tests are completely normal, you will receive your results only by: Ann Vang (if you have MyChart) OR A paper copy in the mail If you have any lab test that is abnormal or we need to change your treatment, we will call you to review the results.   Testing/Procedures: none   Follow-Up: At [facility], you and your health needs are our priority.  As part of our continuing mission to provide you with exceptional heart care, we have created designated Provider Care Teams.  These Care Teams include your primary Cardiologist (physician) and Advanced Practice Providers (APPs -  Physician Assistants and Nurse Practitioners) who all work together to provide you with the care you need, when you need it.  We recommend signing up for the patient portal called "MyChart".  Sign up information is provided on this After Visit Summary.  MyChart is used to connect with patients for Virtual Visits (Telemedicine).  Patients are able to view lab/test results, encounter notes, upcoming appointments, etc.  Non-urgent messages can be sent to your provider as well.   To learn more about what you can do with MyChart, go to [email or web address].    Your next appointment:   6 month(s)  The format for your next appointment:   In Person  Provider:   [name], MD     Other Instructions

## 2021-06-26 LAB — COMPREHENSIVE METABOLIC PANEL
ALT: 16 IU/L (ref 0–32)
AST: 19 IU/L (ref 0–40)
Albumin/Globulin Ratio: 1.9 (ref 1.2–2.2)
Albumin: 4.3 g/dL (ref 3.7–4.7)
Alkaline Phosphatase: 59 IU/L (ref 44–121)
BUN/Creatinine Ratio: 25 (ref 12–28)
BUN: 23 mg/dL (ref 8–27)
Bilirubin Total: 0.3 mg/dL (ref 0.0–1.2)
CO2: 24 mmol/L (ref 20–29)
Calcium: 9.1 mg/dL (ref 8.7–10.3)
Chloride: 101 mmol/L (ref 96–106)
Creatinine, Ser: 0.93 mg/dL (ref 0.57–1.00)
Globulin, Total: 2.3 g/dL (ref 1.5–4.5)
Glucose: 84 mg/dL (ref 70–99)
Potassium: 4 mmol/L (ref 3.5–5.2)
Sodium: 138 mmol/L (ref 134–144)
Total Protein: 6.6 g/dL (ref 6.0–8.5)
eGFR: 64 mL/min/{1.73_m2} (ref >59)

## 2021-06-26 LAB — CBC
Hematocrit: 42.8 % (ref 34.0–46.6)
Hemoglobin: 13.8 g/dL (ref 11.1–15.9)
MCH: 31.8 pg (ref 26.6–33.0)
MCHC: 32.2 g/dL (ref 31.5–35.7)
MCV: 99 fL — ABNORMAL HIGH (ref 79–97)
Platelets: 239 10*3/uL (ref 150–450)
RBC: 4.34 x10E6/uL (ref 3.77–5.28)
RDW: 12.2 % (ref 11.7–15.4)
WBC: 7.5 10*3/uL (ref 3.4–10.8)

## 2021-06-26 LAB — TSH: TSH: 1.39 u[IU]/mL (ref 0.450–4.500)

## 2021-07-10 ENCOUNTER — Other Ambulatory Visit: Payer: Self-pay | Admitting: Interventional Cardiology

## 2021-07-10 NOTE — Telephone Encounter (Signed)
Prescription refill request for Eliquis received. Indication: Afib  Last office visit:06/25/21 [name])  Scr:0.93 (06/25/21)  Age: 75 Weight: 73.8kg  Appropriate dose and refill sent to requested pharmacy.

## 2021-07-17 DIAGNOSIS — E041 Nontoxic single thyroid nodule: Secondary | ICD-10-CM | POA: Diagnosis not present

## 2021-07-17 DIAGNOSIS — M810 Age-related osteoporosis without current pathological fracture: Secondary | ICD-10-CM | POA: Diagnosis not present

## 2021-07-17 DIAGNOSIS — D649 Anemia, unspecified: Secondary | ICD-10-CM | POA: Diagnosis not present

## 2021-07-24 DIAGNOSIS — D692 Other nonthrombocytopenic purpura: Secondary | ICD-10-CM | POA: Diagnosis not present

## 2021-07-24 DIAGNOSIS — R0789 Other chest pain: Secondary | ICD-10-CM | POA: Diagnosis not present

## 2021-07-24 DIAGNOSIS — D6869 Other thrombophilia: Secondary | ICD-10-CM | POA: Diagnosis not present

## 2021-07-24 DIAGNOSIS — Z1331 Encounter for screening for depression: Secondary | ICD-10-CM | POA: Diagnosis not present

## 2021-07-24 DIAGNOSIS — I4891 Unspecified atrial fibrillation: Secondary | ICD-10-CM | POA: Diagnosis not present

## 2021-07-24 DIAGNOSIS — D1771 Benign lipomatous neoplasm of kidney: Secondary | ICD-10-CM | POA: Diagnosis not present

## 2021-07-24 DIAGNOSIS — E785 Hyperlipidemia, unspecified: Secondary | ICD-10-CM | POA: Diagnosis not present

## 2021-07-24 DIAGNOSIS — Z1339 Encounter for screening examination for other mental health and behavioral disorders: Secondary | ICD-10-CM | POA: Diagnosis not present

## 2021-07-24 DIAGNOSIS — I700 Atherosclerosis of aorta: Secondary | ICD-10-CM | POA: Diagnosis not present

## 2021-07-24 DIAGNOSIS — Z0000 Encounter for general adult medical examination without abnormal findings: Secondary | ICD-10-CM | POA: Diagnosis not present

## 2021-07-24 DIAGNOSIS — M810 Age-related osteoporosis without current pathological fracture: Secondary | ICD-10-CM | POA: Diagnosis not present

## 2021-07-26 ENCOUNTER — Ambulatory Visit
Admission: RE | Admit: 2021-07-26 | Discharge: 2021-07-26 | Disposition: A | Discharge status: Home / Self Care | Payer: PPO | Source: Ambulatory Visit | Attending: Internal Medicine | Admitting: Internal Medicine

## 2021-07-26 DIAGNOSIS — Z1231 Encounter for screening mammogram for malignant neoplasm of breast: Secondary | ICD-10-CM | POA: Diagnosis not present

## 2021-07-30 ENCOUNTER — Other Ambulatory Visit: Payer: Self-pay | Admitting: Internal Medicine

## 2021-07-30 DIAGNOSIS — R928 Other abnormal and inconclusive findings on diagnostic imaging of breast: Secondary | ICD-10-CM

## 2021-07-31 DIAGNOSIS — M1711 Unilateral primary osteoarthritis, right knee: Secondary | ICD-10-CM | POA: Diagnosis not present

## 2021-08-01 ENCOUNTER — Ambulatory Visit
Admission: RE | Admit: 2021-08-01 | Discharge: 2021-08-01 | Disposition: A | Discharge status: Home / Self Care | Payer: PPO | Source: Ambulatory Visit | Attending: Internal Medicine | Admitting: Internal Medicine

## 2021-08-01 DIAGNOSIS — R928 Other abnormal and inconclusive findings on diagnostic imaging of breast: Secondary | ICD-10-CM | POA: Diagnosis not present

## 2021-08-01 DIAGNOSIS — R922 Inconclusive mammogram: Secondary | ICD-10-CM | POA: Diagnosis not present

## 2021-08-26 DIAGNOSIS — L578 Other skin changes due to chronic exposure to nonionizing radiation: Secondary | ICD-10-CM | POA: Diagnosis not present

## 2021-08-26 DIAGNOSIS — D2262 Melanocytic nevi of left upper limb, including shoulder: Secondary | ICD-10-CM | POA: Diagnosis not present

## 2021-08-26 DIAGNOSIS — Z86018 Personal history of other benign neoplasm: Secondary | ICD-10-CM | POA: Diagnosis not present

## 2021-08-26 DIAGNOSIS — Z23 Encounter for immunization: Secondary | ICD-10-CM | POA: Diagnosis not present

## 2021-08-26 DIAGNOSIS — Z808 Family history of malignant neoplasm of other organs or systems: Secondary | ICD-10-CM | POA: Diagnosis not present

## 2021-08-26 DIAGNOSIS — L821 Other seborrheic keratosis: Secondary | ICD-10-CM | POA: Diagnosis not present

## 2021-08-26 DIAGNOSIS — D225 Melanocytic nevi of trunk: Secondary | ICD-10-CM | POA: Diagnosis not present

## 2021-08-26 DIAGNOSIS — D1722 Benign lipomatous neoplasm of skin and subcutaneous tissue of left arm: Secondary | ICD-10-CM | POA: Diagnosis not present

## 2021-09-09 ENCOUNTER — Other Ambulatory Visit: Payer: Self-pay | Admitting: *Deleted

## 2021-09-09 ENCOUNTER — Other Ambulatory Visit: Payer: Self-pay | Admitting: Interventional Radiology

## 2021-09-09 DIAGNOSIS — D1771 Benign lipomatous neoplasm of kidney: Secondary | ICD-10-CM

## 2021-09-25 DIAGNOSIS — D3002 Benign neoplasm of left kidney: Secondary | ICD-10-CM | POA: Diagnosis not present

## 2021-10-01 ENCOUNTER — Encounter (HOSPITAL_COMMUNITY): Payer: Self-pay

## 2021-10-01 ENCOUNTER — Ambulatory Visit (HOSPITAL_COMMUNITY)
Admission: RE | Admit: 2021-10-01 | Discharge: 2021-10-01 | Disposition: A | Discharge status: Home / Self Care | Payer: PPO | Source: Ambulatory Visit | Attending: Interventional Radiology | Admitting: Interventional Radiology

## 2021-10-01 ENCOUNTER — Other Ambulatory Visit: Payer: Self-pay

## 2021-10-01 DIAGNOSIS — D1771 Benign lipomatous neoplasm of kidney: Secondary | ICD-10-CM

## 2021-10-01 DIAGNOSIS — N200 Calculus of kidney: Secondary | ICD-10-CM | POA: Diagnosis not present

## 2021-10-01 LAB — POCT I-STAT CREATININE: Creatinine, Ser: 0.7 mg/dL (ref 0.44–1.00)

## 2021-10-01 MED ORDER — SODIUM CHLORIDE (PF) 0.9 % IJ SOLN
INTRAMUSCULAR | Status: AC
  Filled 2021-10-01: qty 50

## 2021-10-01 MED ORDER — IOHEXOL 350 MG/ML SOLN
100.0000 mL | Freq: Once | INTRAVENOUS | Status: AC | PRN
  Administered 2021-10-01: 100 mL via INTRAVENOUS

## 2021-10-03 DIAGNOSIS — N959 Unspecified menopausal and perimenopausal disorder: Secondary | ICD-10-CM | POA: Diagnosis not present

## 2021-10-03 DIAGNOSIS — Z01419 Encounter for gynecological examination (general) (routine) without abnormal findings: Secondary | ICD-10-CM | POA: Diagnosis not present

## 2021-10-03 DIAGNOSIS — Z6826 Body mass index (BMI) 26.0-26.9, adult: Secondary | ICD-10-CM | POA: Diagnosis not present

## 2021-10-07 ENCOUNTER — Other Ambulatory Visit: Payer: Self-pay

## 2021-10-07 ENCOUNTER — Encounter: Payer: Self-pay | Admitting: *Deleted

## 2021-10-07 ENCOUNTER — Ambulatory Visit
Admission: RE | Admit: 2021-10-07 | Discharge: 2021-10-07 | Disposition: A | Discharge status: Home / Self Care | Payer: PPO | Source: Ambulatory Visit | Attending: Interventional Radiology | Admitting: Interventional Radiology

## 2021-10-07 DIAGNOSIS — D1771 Benign lipomatous neoplasm of kidney: Secondary | ICD-10-CM | POA: Diagnosis not present

## 2021-10-07 DIAGNOSIS — Z9889 Other specified postprocedural states: Secondary | ICD-10-CM | POA: Diagnosis not present

## 2021-10-07 HISTORY — PX: IR RADIOLOGIST EVAL & MGMT: IMG5224

## 2021-10-07 NOTE — Progress Notes (Signed)
Chief Complaint: Patient was consulted remotely today (TeleHealth) for left renal AML at the request of Braxtyn Dorff K.    Referring Physician(s): Dr. Denyce Robert  History of Present Illness: Ann Vang is a 76 y.o. female  with a history of a large 7.2 cm left renal angiomyolipoma.  She underwent transarterial embolization via a left radial approach on 05/13/2017 and presents today for scheduled follow-up evaluation.   Clinically, [name] is doing exceptionally well.  She is asymptomatic.  She denies flank pain, hematuria, dysuria, fever, chills or other systemic symptoms.   CT imaging dated 09/22/2019 demonstrates continued slight interval involution of the treated renal angiomyolipoma which now measures 4.9 x 3.2 x 5.2 cm.  CT imaging 10/01/21 - Post embolization changes with similar size and appearance of exophytic LEFT renal fat-containing mass, consistent with known AML.   Past Medical History:  Diagnosis Date   Abnormal breast finding 12/1998   left breast abnl   Arthritis    --basal joint--right hand   Carpal tunnel syndrome, right    Compression fracture of T2 vertebra ([place]) 05/10/2019   Diverticulosis 2003   mild   Lumbar compression fracture ([place]) 07/13/2012   Lumbar disc disorder 2000   bulging   Melena    Menopause    Osteoporosis    Fx L4 age 8 water skiing accident   Palpitations    Thyroid disease 10/2014   thyroid nodules--bx'd large nodule and was benign--sees Gen. Psychologist, sport and exercise with [facility]    Past Surgical History:  Procedure Laterality Date   [place]   Implants have been removed   BREAST SURGERY  2010   implants removed ruptured   CERVICAL BIOPSY  W/ LOOP ELECTRODE EXCISION  1996   CIN   IR EMBO TUMOR ORGAN ISCHEMIA INFARCT INC GUIDE ROADMAPPING  05/13/2017   IR RADIOLOGIST EVAL & MGMT  04/15/2017   IR RADIOLOGIST EVAL & MGMT  05/27/2017   IR RADIOLOGIST EVAL & MGMT  01/21/2018   IR RADIOLOGIST EVAL & MGMT   09/28/2019   IR RENAL SUPRASEL UNI S&I MOD SED  05/13/2017   IR US GUIDE VASC ACCESS LEFT  05/13/2017   KNEE SURGERY  2012   meniscus rt knee (both)   L-2 fracture  2009   fall in aerobics   L-4 fracture   water skiing     TENOSYNOVECTOMY Right 03/11/2016   Procedure: TENOSYNOVECTOMY FLEXOR RIGHT RING FINGER;  Surgeon: Daryll Brod, MD;  Location: [facility];  Service: Orthopedics;  Laterality: Right;   thyroid nodule biopsy  10/2014   --[facility], [place]--   TONSILLECTOMY AND ADENOIDECTOMY  1964    Allergies: Patient has no known allergies.  Medications: Prior to Admission medications   Medication Sig Start Date End Date Taking? Authorizing Provider  amiodarone (PACERONE) 200 MG tablet Take 0.5 tablets (100 mg total) by mouth daily. 06/25/21   Jettie Booze, MD  Bacillus Coagulans-Inulin (PROBIOTIC) 1-250 BILLION-MG CAPS Take by mouth.    [provider]  Cholecalciferol (VITAMIN D) 2000 UNITS tablet Take 2,000 Units by mouth daily.    [provider]  ELIQUIS 5 MG TABS tablet Take 1 tablet (5 mg total) by mouth 2 (two) times daily. 07/10/21   Jettie Booze, MD  estradiol (VIVELLE-DOT) 0.0375 MG/24HR Place onto the skin. 04/16/21   [provider]  fluticasone (FLONASE) 50 MCG/ACT nasal spray Place 2 sprays into the nose daily. 07/13/12   Burnice Logan, MD  magnesium gluconate (MAGONATE) 500 MG tablet Take 500 mg by mouth 2 (two) times daily.    [provider]  milk thistle 175 MG tablet Take 175 mg by mouth daily.    [provider]  progesterone (PROMETRIUM) 100 MG capsule TAKE ONE CAPSULE BY MOUTH DAILY 02/23/19   Megan Salon, MD  vitamin k 100 MCG tablet Take 300 mcg by mouth daily.    [provider]     Family History  Problem Relation Age of Onset   Osteoporosis Mother    Alzheimer's disease Mother    Hypertension Father    Heart attack Father    Osteoporosis Maternal Grandmother     Colon cancer Maternal Grandmother    Colon cancer Other     Social History   Socioeconomic History   Marital status: Married    Spouse name: Not on file   Number of children: Not on file   Years of education: Not on file   Highest education level: Not on file  Occupational History   Not on file  Tobacco Use   Smoking status: Former    Types: Cigarettes    Quit date: 12/21/1972    Years since quitting: 48.8   Smokeless tobacco: Never  Vaping Use   Vaping Use: Never used  Substance and Sexual Activity   Alcohol use: Yes    Alcohol/week: 14.0 standard drinks    Types: 14 Glasses of wine per week   Drug use: No   Sexual activity: Yes    Partners: Male    Birth control/protection: Post-menopausal  Other Topics Concern   Not on file  Social History Narrative   Not on file   Social Determinants of Health   Financial Resource Strain: Not on file  Food Insecurity: Not on file  Transportation Needs: Not on file  Physical Activity: Not on file  Stress: Not on file  Social Connections: Not on file    Review of Systems  Review of Systems: A 12 point ROS discussed and pertinent positives are indicated in the HPI above.  All other systems are negative.  Physical Exam No direct physical exam was performed (except for noted visual exam findings with Video Visits).    Vital Signs: LMP 08/18/1994 (Approximate)   Imaging: CT ANGIO ABDOMEN W &/OR WO CONTRAST  Result Date: 10/01/2021 CLINICAL DATA:  History of renal embolization in 2018, follow up renal angiomyolipoma EXAM: CT ANGIOGRAPHY ABDOMEN TECHNIQUE: Multidetector CT imaging of the abdomen was performed using the standard protocol during bolus administration of intravenous contrast. Multiplanar reconstructed images and MIPs were obtained and reviewed to evaluate the vascular anatomy. RADIATION DOSE REDUCTION: This exam was performed according to the departmental dose-optimization program which includes automated exposure  control, adjustment of the mA and/or kV according to patient size and/or use of iterative reconstruction technique. CONTRAST:  13mL OMNIPAQUE IOHEXOL 350 MG/ML SOLN COMPARISON:  CTA abdomen, most recently 09/22/2019 FINDINGS: Suboptimal evaluation, secondary to motion degradation. VASCULAR Aorta: Minimal aortic atherosclerosis. Normal caliber aorta without aneurysm, dissection, vasculitis or significant stenosis. Celiac: Widely patent without evidence of aneurysm, dissection, vasculitis or significant stenosis. SMA: Diminutive. Widely patent without evidence of aneurysm, dissection, vasculitis or significant stenosis. Renals: Single renal arteries are present bilaterally. Both renal arteries are patent without evidence of aneurysm, dissection, vasculitis, fibromuscular dysplasia or significant stenosis. IMA: Widely patent without evidence of aneurysm, dissection, vasculitis or significant stenosis. Imaged pelvis: Normal appearance of the aortic bifurcation. Imaged portions of the common iliac arteries  are patent without evidence of aneurysm, dissection, vasculitis or significant stenosis. Veins: No obvious venous abnormality within the limitations of this arterial phase study. Review of the MIP images confirms the above findings. NON-VASCULAR Lower chest: No acute abnormality. Hepatobiliary: Multifocal hepatic hypodensities, largest within RIGHT hepatic lobe and measuring up to 1.5 cm, and consistent with a simple cyst. Smaller subcentimeter hypodense lesions are too small to adequately characterize though likely additional cysts. No gallstones, gallbladder wall thickening, or biliary dilatation. Pancreas: No pancreatic ductal dilatation or surrounding inflammatory changes. Spleen: Normal in size without focal abnormality. Adrenals/Urinary Tract: *Adrenal glands are unremarkable. *Punctate bilateral nephroliths.  No hydronephrosis. *Changes of LEFT renal angiomyolipoma embolization with dense coil pack. *Similar  size, appearance and enhancement of superior anterior exophytic LEFT upper pole renal mass consistent with known mL measuring up to 4.6 x 3.2 cm on today's evaluation (previously 4.9 x 3.2 cm, 09/2019). *Bladder is unremarkable. Stomach/Bowel: Stomach is within normal limits. Imaged small bowel is nonobstructed and colon is nondilated. Transverse colon diverticula. No evidence of bowel wall thickening, distention, or inflammatory changes. Lymphatic: No enlarged abdominal or pelvic lymph nodes. Other: No abdominal wall hernia or abnormality. No abdominopelvic ascites. Musculoskeletal: Multilevel degenerative changes of imaged spine, including chronic superior endplate compression deformities of T12, L2, and L4. No acute osseous findings. IMPRESSION: VASCULAR 1. Minimal aortic atherosclerosis. No aneurysm or hemodynamically significant stenosis. Aortic Atherosclerosis (ICD10-I70.0). NON-VASCULAR 1. Post embolization changes with similar size and appearance of exophytic LEFT renal fat-containing mass, consistent with known AML. 2. Bilateral nonobstructing nephrolithiasis. Additional incidental, chronic and senescent findings as above. Michaelle Birks, MD [facility] Radiology Electronically Signed   By: Michaelle Birks M.D.   On: 10/01/2021 15:01    Labs:  CBC: Recent Labs    12/17/20 1200 06/25/21 1357  WBC 6.9 7.5  HGB 14.6 13.8  HCT 42.9 42.8  PLT 233 239    COAGS: No results for input(s): INR, APTT in the last 8760 hours.  BMP: Recent Labs    12/17/20 1200 06/25/21 1357 10/01/21 1044  NA 139 138  --   K 4.0 4.0  --   CL 101 101  --   CO2 24 24  --   GLUCOSE 101* 84  --   BUN 19 23  --   CALCIUM 9.4 9.1  --   CREATININE 0.80 0.93 0.70    LIVER FUNCTION TESTS: Recent Labs    12/17/20 1200 06/25/21 1357  BILITOT 0.5 0.3  AST 25 19  ALT 19 16  ALKPHOS 60 59  PROT 6.9 6.6  ALBUMIN 4.6 4.3    TUMOR MARKERS: No results for input(s):  AFPTM, CEA, CA199, CHROMGRNA in the last 8760 hours.  Assessment and Plan:  76 year old female continuing to do well 4-1/2 years status post transarterial embolization of left renal angiomyolipoma.  She remains clinically asymptomatic and imaging surveillance demonstrates a stable appearance of the lesion without evidence of growth or other evolution.  Continue surveillance imaging every other year.  1.)  Follow-up CT arteriogram of the abdomen and accompanying clinic visit in 2 years (February/March 2025).    Electronically Signed: Criselda Peaches 10/07/2021, 11:14 AM   I spent a total of 15 Minutes in remote  clinical consultation, greater than 50% of which was counseling/coordinating care for left renal AML.    Visit type: Audio only (telephone). Audio (no video) only due to patient preference. Alternative for in-person consultation at [facility], [address] [place],  [place], [place]. This visit type was conducted due to national recommendations for restrictions regarding the COVID-19 Pandemic (e.g. social distancing).  This format is felt to be most appropriate for this patient at this time.  All issues noted in this document were discussed and addressed.

## 2021-10-16 ENCOUNTER — Telehealth: Payer: Self-pay | Admitting: Interventional Cardiology

## 2021-10-16 DIAGNOSIS — I4891 Unspecified atrial fibrillation: Secondary | ICD-10-CM

## 2021-10-16 MED ORDER — APIXABAN 5 MG PO TABS: 5.0000 mg | ORAL_TABLET | Freq: Two times a day (BID) | ORAL | 1 refills | Status: DC

## 2021-10-16 NOTE — Telephone Encounter (Signed)
Eliquis 5mg  refill request received. Patient is 76 years old, weight-73.8kg, Crea-0.70 on 10/01/2021, Diagnosis-Afib, and last seen by Dr. [name] on 06/25/2021. Dose is appropriate based on dosing criteria. Will send in refill to requested pharmacy.   ?

## 2021-10-16 NOTE — Telephone Encounter (Signed)
?*  STAT* If patient is at the pharmacy, call can be transferred to refill team. ? ? ?1. Which medications need to be refilled? (please list name of each medication and dose if known) ELIQUIS 5 MG TABS tablet ? ?2. Which pharmacy/location (including street and city if local pharmacy) is medication to be sent to? [place], [place] ? ?3. Do they need a 30 day or 90 day supply? 90 day ? ? ? ?

## 2022-01-03 ENCOUNTER — Encounter: Payer: Self-pay | Admitting: Interventional Cardiology

## 2022-01-23 DIAGNOSIS — S61011A Laceration without foreign body of right thumb without damage to nail, initial encounter: Secondary | ICD-10-CM | POA: Diagnosis not present

## 2022-01-23 DIAGNOSIS — S60221A Contusion of right hand, initial encounter: Secondary | ICD-10-CM | POA: Diagnosis not present

## 2022-02-03 DIAGNOSIS — Z4802 Encounter for removal of sutures: Secondary | ICD-10-CM | POA: Diagnosis not present

## 2022-02-05 DIAGNOSIS — H2513 Age-related nuclear cataract, bilateral: Secondary | ICD-10-CM | POA: Diagnosis not present

## 2022-02-05 DIAGNOSIS — H35373 Puckering of macula, bilateral: Secondary | ICD-10-CM | POA: Diagnosis not present

## 2022-02-05 DIAGNOSIS — D3131 Benign neoplasm of right choroid: Secondary | ICD-10-CM | POA: Diagnosis not present

## 2022-02-05 DIAGNOSIS — H52203 Unspecified astigmatism, bilateral: Secondary | ICD-10-CM | POA: Diagnosis not present

## 2022-02-11 ENCOUNTER — Encounter: Payer: Self-pay | Admitting: Interventional Cardiology

## 2022-02-11 DIAGNOSIS — Z79899 Other long term (current) drug therapy: Secondary | ICD-10-CM

## 2022-03-10 DIAGNOSIS — D225 Melanocytic nevi of trunk: Secondary | ICD-10-CM | POA: Diagnosis not present

## 2022-03-11 ENCOUNTER — Ambulatory Visit
Admission: RE | Admit: 2022-03-11 | Discharge: 2022-03-11 | Disposition: A | Discharge status: Home / Self Care | Payer: PPO | Source: Ambulatory Visit | Attending: Interventional Cardiology | Admitting: Interventional Cardiology

## 2022-03-11 ENCOUNTER — Other Ambulatory Visit: Payer: PPO

## 2022-03-11 DIAGNOSIS — Z79899 Other long term (current) drug therapy: Secondary | ICD-10-CM

## 2022-03-11 DIAGNOSIS — S22080A Wedge compression fracture of T11-T12 vertebra, initial encounter for closed fracture: Secondary | ICD-10-CM | POA: Diagnosis not present

## 2022-03-12 LAB — COMPREHENSIVE METABOLIC PANEL
ALT: 10 IU/L (ref 0–32)
AST: 21 IU/L (ref 0–40)
Albumin/Globulin Ratio: 1.8 (ref 1.2–2.2)
Albumin: 4.2 g/dL (ref 3.8–4.8)
Alkaline Phosphatase: 54 IU/L (ref 44–121)
BUN/Creatinine Ratio: 30 — ABNORMAL HIGH (ref 12–28)
BUN: 22 mg/dL (ref 8–27)
Bilirubin Total: 0.5 mg/dL (ref 0.0–1.2)
CO2: 21 mmol/L (ref 20–29)
Calcium: 9.2 mg/dL (ref 8.7–10.3)
Chloride: 102 mmol/L (ref 96–106)
Creatinine, Ser: 0.74 mg/dL (ref 0.57–1.00)
Globulin, Total: 2.4 g/dL (ref 1.5–4.5)
Glucose: 95 mg/dL (ref 70–99)
Potassium: 4.1 mmol/L (ref 3.5–5.2)
Sodium: 138 mmol/L (ref 134–144)
Total Protein: 6.6 g/dL (ref 6.0–8.5)
eGFR: 84 mL/min/{1.73_m2} (ref >59)

## 2022-03-12 LAB — TSH: TSH: 1.12 u[IU]/mL (ref 0.450–4.500)

## 2022-03-26 NOTE — Progress Notes (Unsigned)
Cardiology Office Note   Date:  03/26/2022   ID:  TKAI LARGE, DOB 27-Sep-1945, MRN [id number]  PCP:  Velna Hatchet, MD    No chief complaint on file.  AFib  Wt Readings from Last 3 Encounters:  06/25/21 162 lb 12.8 oz (73.8 kg)  11/29/20 163 lb (73.9 kg)  05/29/20 162 lb 3.2 oz (73.6 kg)       History of Present Illness: Ann Vang is a 76 y.o. female  has had palpitations since May 2021.  Apple watch showed AFib.  Confirmed by monitor in July 2021.  She was started on Xarelto at that time.  No bleeding issues on Xarelto.  No bruising issues.   In early 2022, she needed surgery but she was in atrial fibrillation when she showed up for the procedure.  It was canceled.  We started amiodarone in February 2022 to help her maintain normal sinus rhythm.  She was able to have her D&C procedure done.  Due to some bleeding, she held her Xarelto for an extra couple of days.  Initially, we considered using the amiodarone just around the time of surgery.  Since her symptoms were better controlled,  she decided to continue the medication.     She had been doing well on the Xarelto but started bleeding in April 2022.   She wanted to decrease Xarelto and amio.  Was switched to Eliquis.    In April 2022, we decreased her amiodarone to 200 mg daily.  We stressed the importance of routine monitoring in terms of blood work as well as chest x-ray.  We were going to see based on Dr. Felix Ahmadi recommendations whether we need to hold Xarelto.    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   We have been trying to minimize Amio dose.       Past Medical History:  Diagnosis Date   Abnormal breast finding 12/1998   left breast abnl   Arthritis    --basal joint--right hand   Carpal tunnel syndrome, right    Compression fracture of T2 vertebra ([place]) 05/10/2019   Diverticulosis 2003   mild   Lumbar compression fracture ([place]) 07/13/2012   Lumbar disc  disorder 2000   bulging   Melena    Menopause    Osteoporosis    Fx L4 age 72 water skiing accident   Palpitations    Thyroid disease 10/2014   thyroid nodules--bx'd large nodule and was benign--sees Gen. Psychologist, sport and exercise with [facility]    Past Surgical History:  Procedure Laterality Date   [place]   Implants have been removed   BREAST SURGERY  2010   implants removed ruptured   CERVICAL BIOPSY  W/ LOOP ELECTRODE EXCISION  1996   CIN   IR EMBO TUMOR ORGAN ISCHEMIA INFARCT INC GUIDE ROADMAPPING  05/13/2017   IR RADIOLOGIST EVAL & MGMT  04/15/2017   IR RADIOLOGIST EVAL & MGMT  05/27/2017   IR RADIOLOGIST EVAL & MGMT  01/21/2018   IR RADIOLOGIST EVAL & MGMT  09/28/2019   IR RADIOLOGIST EVAL & MGMT  10/07/2021   IR RENAL SUPRASEL UNI S&I MOD SED  05/13/2017   IR US GUIDE VASC ACCESS LEFT  05/13/2017   KNEE SURGERY  2012   meniscus rt knee (both)   L-2 fracture  2009   fall in aerobics   L-4 fracture   water skiing     TENOSYNOVECTOMY Right 03/11/2016  Procedure: TENOSYNOVECTOMY FLEXOR RIGHT RING FINGER;  Surgeon: Daryll Brod, MD;  Location: [facility];  Service: Orthopedics;  Laterality: Right;   thyroid nodule biopsy  10/2014   --[facility], [place]--   TONSILLECTOMY AND ADENOIDECTOMY  1964     Current Outpatient Medications  Medication Sig Dispense Refill   amiodarone (PACERONE) 200 MG tablet Take 0.5 tablets (100 mg total) by mouth daily. 45 tablet 3   apixaban (ELIQUIS) 5 MG TABS tablet Take 1 tablet (5 mg total) by mouth 2 (two) times daily. 180 tablet 1   Bacillus Coagulans-Inulin (PROBIOTIC) 1-250 BILLION-MG CAPS Take by mouth.     Cholecalciferol (VITAMIN D) 2000 UNITS tablet Take 2,000 Units by mouth daily.     estradiol (VIVELLE-DOT) 0.0375 MG/24HR Place onto the skin.     fluticasone (FLONASE) 50 MCG/ACT nasal spray Place 2 sprays into the nose daily. 48 g 1   magnesium gluconate (MAGONATE) 500 MG tablet Take 500 mg by mouth 2 (two) times  daily.     milk thistle 175 MG tablet Take 175 mg by mouth daily.     progesterone (PROMETRIUM) 100 MG capsule TAKE ONE CAPSULE BY MOUTH DAILY 90 capsule 0   vitamin k 100 MCG tablet Take 300 mcg by mouth daily.     No current facility-administered medications for this visit.    Allergies:   Patient has no known allergies.    Social History:  The patient  reports that she quit smoking about 49 years ago. Her smoking use included cigarettes. She has never used smokeless tobacco. She reports current alcohol use of about 14.0 standard drinks of alcohol per week. She reports that she does not use drugs.   Family History:  The patient's ***family history includes Alzheimer's disease in her mother; Colon cancer in her maternal grandmother and another family member; Heart attack in her father; Hypertension in her father; Osteoporosis in her maternal grandmother and mother.    ROS:  Please see the history of present illness.   Otherwise, review of systems are positive for ***.   All other systems are reviewed and negative.    PHYSICAL EXAM: VS:  LMP 08/18/1994 (Approximate)  , BMI There is no height or weight on file to calculate BMI. GEN: Well nourished, well developed, in no acute distress HEENT: normal Neck: no JVD, carotid bruits, or masses Cardiac: ***RRR; no murmurs, rubs, or gallops,no edema  Respiratory:  clear to auscultation bilaterally, normal work of breathing GI: soft, nontender, nondistended, + BS MS: no deformity or atrophy Skin: warm and dry, no rash Neuro:  Strength and sensation are intact Psych: euthymic mood, full affect   EKG:   The ekg ordered today demonstrates ***   Recent Labs: 06/25/2021: Hemoglobin 13.8; Platelets 239 03/11/2022: ALT 10; BUN 22; Creatinine, Ser 0.74; Potassium 4.1; Sodium 138; TSH 1.120   Lipid Panel No results found for: "CHOL", "TRIG", "HDL", "CHOLHDL", "VLDL", "LDLCALC", "LDLDIRECT"   Other studies Reviewed: Additional studies/ records  that were reviewed today with results demonstrating: ***.   ASSESSMENT AND PLAN:  PAF:  Anticoagulated: Prior atypical chest pain:   Current medicines are reviewed at length with the patient today.  The patient concerns regarding her medicines were addressed.  The following changes have been made:  No change***  Labs/ tests ordered today include: *** No orders of the defined types were placed in this encounter.   Recommend 150 minutes/week of aerobic exercise Low fat, low carb, high fiber diet recommended  Disposition:  FU in ***   Signed, [name], MD  03/26/2022 10:58 AM    [facility] [address] [place], [place], [place]  [address] Phone: [phone]; Fax: [phone]

## 2022-03-27 ENCOUNTER — Ambulatory Visit: Payer: PPO | Admitting: Interventional Cardiology

## 2022-03-27 ENCOUNTER — Encounter: Payer: Self-pay | Admitting: Interventional Cardiology

## 2022-03-27 VITALS — BP 128/80 | HR 63 | Ht 66.0 in | Wt 164.0 lb

## 2022-03-27 DIAGNOSIS — Z79899 Other long term (current) drug therapy: Secondary | ICD-10-CM | POA: Diagnosis not present

## 2022-03-27 DIAGNOSIS — Z7901 Long term (current) use of anticoagulants: Secondary | ICD-10-CM | POA: Diagnosis not present

## 2022-03-27 DIAGNOSIS — I4891 Unspecified atrial fibrillation: Secondary | ICD-10-CM

## 2022-03-27 NOTE — Patient Instructions (Addendum)
Medication Instructions:  Your physician recommends that you continue on your current medications as directed. Please refer to the Current Medication list given to you today.  *If you need a refill on your cardiac medications before your next appointment, please call your pharmacy*   Lab Work: Your physician recommends that you return for lab work on September 08 2022.  TSH and CMET.  The lab opens at 7:15 AM.  This is not fasting If you have labs (blood work) drawn today and your tests are completely normal, you will receive your results only by: [place] (if you have MyChart) OR A paper copy in the mail If you have any lab test that is abnormal or we need to change your treatment, we will call you to review the results.   Testing/Procedures: Have Chest X ray done around September 08, 2022.  To be done at [facility].  [place]: At [facility], you and your health needs are our priority.  As part of our continuing mission to provide you with exceptional heart care, we have created designated Provider Care Teams.  These Care Teams include your primary Cardiologist (physician) and Advanced Practice Providers (APPs -  Physician Assistants and Nurse Practitioners) who all work together to provide you with the care you need, when you need it.  We recommend signing up for the patient portal called "MyChart".  Sign up information is provided on this After Visit Summary.  MyChart is used to connect with patients for Virtual Visits (Telemedicine).  Patients are able to view lab/test results, encounter notes, upcoming appointments, etc.  Non-urgent messages can be sent to your provider as well.   To learn more about what you can do with MyChart, go to [email or web address].    Your next appointment:   12 month(s)  The format for your next appointment:   In Person  Provider:   [name], MD     Other Instructions    Important Information About  [place]

## 2022-04-16 ENCOUNTER — Telehealth: Payer: Self-pay | Admitting: *Deleted

## 2022-04-16 NOTE — Telephone Encounter (Signed)
-----   Message from Jettie Booze, MD sent at 04/08/2022  1:36 PM EDT ----- Looks like no trials to enroll in at this time as noted below.  If she wants to try the strategy of using Eliquis only when she feels AFib, it is associated with higher risk of stroke.  ----- Message ----- From: Vickie Epley, MD Sent: 03/29/2022   4:38 PM EDT To: Jettie Booze, MD; #  No trial yet, several are ongoing for a 'pill in the pocket' approach to anticoagulation. The data and guidelines don't support that strategy. Some patients will elect to still pursue this which I think is reasonable as long as they understand this is off label without data. I usually document a lot saying that the patient elects to pursue and accepts the higher stroke risk. CL   ----- Message ----- From: Jettie Booze, MD Sent: 03/27/2022  12:12 PM EDT To: Constance Haw, MD; Vickie Epley, MD  Is there a trial with Apple watch with taking anticoagulation only when in AFib?  THis patient has had 0 AFib according to her watch on Amio 100 mg daily and wants to come off of Eliquis.    CHADs vasc score is 3 for age and gender.  Prior to [facility], she had a 9% AFib burden on a monitor.  Thanks.

## 2022-04-16 NOTE — Telephone Encounter (Signed)
Called patient and informed her of advisement and the risk of not taking eliquis all the time. Patient stated she wears her apple watch while she sleeps and she has had no A. FIB since March 2022. Patient has a new question and she is wondering if she could just take one eliquis a day instead of two. Informed patient that we would ask Dr. [name] and get back with her.

## 2022-04-17 NOTE — Telephone Encounter (Signed)
Left message for patient to call back  

## 2022-04-17 NOTE — Telephone Encounter (Signed)
That dose has not been studied.  If she tries it, she would be at higher risk of stroke as the blood thinning may not last 24 hours/day.

## 2022-04-17 NOTE — Telephone Encounter (Signed)
Patient called back informed her of Dr. Hassell Done advisement. Per Dr. [name], that dose has not been studied.  If she tries it, she would be at higher risk of stroke as the blood thinning may not last 24 hours/day. Patient verbalized understanding and did not have any more questions.

## 2022-04-23 ENCOUNTER — Telehealth: Payer: Self-pay | Admitting: Interventional Cardiology

## 2022-04-23 ENCOUNTER — Other Ambulatory Visit: Payer: Self-pay | Admitting: Interventional Cardiology

## 2022-04-23 DIAGNOSIS — I4891 Unspecified atrial fibrillation: Secondary | ICD-10-CM

## 2022-04-23 MED ORDER — APIXABAN 5 MG PO TABS: 5.0000 mg | ORAL_TABLET | Freq: Two times a day (BID) | ORAL | 1 refills | Status: DC

## 2022-04-23 NOTE — Telephone Encounter (Signed)
*  STAT* If patient is at the pharmacy, call can be transferred to refill team.   1. Which medications need to be refilled? (please list name of each medication and dose if known)   apixaban (ELIQUIS) 5 MG TABS tablet  2. Which pharmacy/location (including street and city if local pharmacy) is medication to be sent to?  [place], [place]  3. Do they need a 30 day or 90 day supply?  90 day  Patient stated she has enough medication for 4 days.  Patient stated she is going out of town on 9/8.

## 2022-04-23 NOTE — Telephone Encounter (Signed)
Prescription refill request for Eliquis received. Indication: AF Last office visit: 03/27/22  Lendell Caprice MD Scr: 0.74 on 03/11/22 Age:  76 Weight: 74.4kg  Based on above findings Eliquis '5mg'$  twice daily is the appropriate dose.  Refill approved.

## 2022-06-05 DIAGNOSIS — Z23 Encounter for immunization: Secondary | ICD-10-CM | POA: Diagnosis not present

## 2022-06-09 DIAGNOSIS — M1711 Unilateral primary osteoarthritis, right knee: Secondary | ICD-10-CM | POA: Diagnosis not present

## 2022-06-20 ENCOUNTER — Other Ambulatory Visit: Payer: Self-pay | Admitting: Internal Medicine

## 2022-06-20 DIAGNOSIS — Z1231 Encounter for screening mammogram for malignant neoplasm of breast: Secondary | ICD-10-CM

## 2022-07-15 ENCOUNTER — Other Ambulatory Visit: Payer: Self-pay | Admitting: Interventional Cardiology

## 2022-07-15 ENCOUNTER — Telehealth: Payer: Self-pay | Admitting: Interventional Cardiology

## 2022-07-15 NOTE — Telephone Encounter (Signed)
*  STAT* If patient is at the pharmacy, call can be transferred to refill team.   1. Which medications need to be refilled? (please list name of each medication and dose if known) amiodarone (PACERONE) 100 MG tablet apixaban (ELIQUIS) 5 MG TABS tablet  2. Which pharmacy/location (including street and city if local pharmacy) is medication to be sent to? [place], [place]   3. Do they need a 30 day or 90 day supply? [place]

## 2022-07-15 NOTE — Telephone Encounter (Signed)
Pt's medications were already sent to pt's pharmacy as requested. Confirmation received.  

## 2022-07-28 DIAGNOSIS — I700 Atherosclerosis of aorta: Secondary | ICD-10-CM | POA: Diagnosis not present

## 2022-07-28 DIAGNOSIS — R7989 Other specified abnormal findings of blood chemistry: Secondary | ICD-10-CM | POA: Diagnosis not present

## 2022-07-28 DIAGNOSIS — M810 Age-related osteoporosis without current pathological fracture: Secondary | ICD-10-CM | POA: Diagnosis not present

## 2022-07-28 DIAGNOSIS — D649 Anemia, unspecified: Secondary | ICD-10-CM | POA: Diagnosis not present

## 2022-07-28 DIAGNOSIS — E041 Nontoxic single thyroid nodule: Secondary | ICD-10-CM | POA: Diagnosis not present

## 2022-07-28 DIAGNOSIS — E785 Hyperlipidemia, unspecified: Secondary | ICD-10-CM | POA: Diagnosis not present

## 2022-08-04 DIAGNOSIS — D1720 Benign lipomatous neoplasm of skin and subcutaneous tissue of unspecified limb: Secondary | ICD-10-CM | POA: Diagnosis not present

## 2022-08-04 DIAGNOSIS — R319 Hematuria, unspecified: Secondary | ICD-10-CM | POA: Diagnosis not present

## 2022-08-04 DIAGNOSIS — Z0000 Encounter for general adult medical examination without abnormal findings: Secondary | ICD-10-CM | POA: Diagnosis not present

## 2022-08-04 DIAGNOSIS — Z1339 Encounter for screening examination for other mental health and behavioral disorders: Secondary | ICD-10-CM | POA: Diagnosis not present

## 2022-08-04 DIAGNOSIS — Z1331 Encounter for screening for depression: Secondary | ICD-10-CM | POA: Diagnosis not present

## 2022-08-04 DIAGNOSIS — I700 Atherosclerosis of aorta: Secondary | ICD-10-CM | POA: Diagnosis not present

## 2022-08-04 DIAGNOSIS — E785 Hyperlipidemia, unspecified: Secondary | ICD-10-CM | POA: Diagnosis not present

## 2022-08-04 DIAGNOSIS — D6869 Other thrombophilia: Secondary | ICD-10-CM | POA: Diagnosis not present

## 2022-08-04 DIAGNOSIS — I4891 Unspecified atrial fibrillation: Secondary | ICD-10-CM | POA: Diagnosis not present

## 2022-08-04 DIAGNOSIS — M810 Age-related osteoporosis without current pathological fracture: Secondary | ICD-10-CM | POA: Diagnosis not present

## 2022-08-04 DIAGNOSIS — D692 Other nonthrombocytopenic purpura: Secondary | ICD-10-CM | POA: Diagnosis not present

## 2022-08-04 DIAGNOSIS — D1771 Benign lipomatous neoplasm of kidney: Secondary | ICD-10-CM | POA: Diagnosis not present

## 2022-08-20 ENCOUNTER — Ambulatory Visit
Admission: RE | Admit: 2022-08-20 | Discharge: 2022-08-20 | Disposition: A | Discharge status: Home / Self Care | Payer: BLUE CROSS/BLUE SHIELD | Source: Ambulatory Visit | Attending: Internal Medicine | Admitting: Internal Medicine

## 2022-08-20 DIAGNOSIS — Z1231 Encounter for screening mammogram for malignant neoplasm of breast: Secondary | ICD-10-CM

## 2022-09-03 DIAGNOSIS — D2261 Melanocytic nevi of right upper limb, including shoulder: Secondary | ICD-10-CM | POA: Diagnosis not present

## 2022-09-03 DIAGNOSIS — D225 Melanocytic nevi of trunk: Secondary | ICD-10-CM | POA: Diagnosis not present

## 2022-09-03 DIAGNOSIS — D1721 Benign lipomatous neoplasm of skin and subcutaneous tissue of right arm: Secondary | ICD-10-CM | POA: Diagnosis not present

## 2022-09-03 DIAGNOSIS — L578 Other skin changes due to chronic exposure to nonionizing radiation: Secondary | ICD-10-CM | POA: Diagnosis not present

## 2022-09-07 DIAGNOSIS — H524 Presbyopia: Secondary | ICD-10-CM | POA: Diagnosis not present

## 2022-09-08 ENCOUNTER — Telehealth: Payer: Self-pay | Admitting: *Deleted

## 2022-09-08 ENCOUNTER — Ambulatory Visit: Payer: Medicare Other | Attending: Interventional Cardiology

## 2022-09-08 ENCOUNTER — Ambulatory Visit
Admission: RE | Admit: 2022-09-08 | Discharge: 2022-09-08 | Disposition: A | Discharge status: Home / Self Care | Payer: Medicare Other | Source: Ambulatory Visit | Attending: Interventional Cardiology | Admitting: Interventional Cardiology

## 2022-09-08 ENCOUNTER — Ambulatory Visit: Payer: Self-pay | Admitting: General Surgery

## 2022-09-08 DIAGNOSIS — I4891 Unspecified atrial fibrillation: Secondary | ICD-10-CM | POA: Diagnosis not present

## 2022-09-08 DIAGNOSIS — D1721 Benign lipomatous neoplasm of skin and subcutaneous tissue of right arm: Secondary | ICD-10-CM | POA: Diagnosis not present

## 2022-09-08 DIAGNOSIS — Z79899 Other long term (current) drug therapy: Secondary | ICD-10-CM

## 2022-09-08 NOTE — Telephone Encounter (Signed)
   Pre-operative Risk Assessment    Patient Name: Ann Vang  DOB: 1946/01/26 MRN: [id number]      Request for Surgical Clearance    Procedure:   E/O LIPOMA SURGERY  Date of Surgery:  Clearance TBD                                 Surgeon:  Autumn Messing, MD Surgeon's Group or Practice Name:  [facility] Phone number:  [phone] Fax number:  [phone]   Type of Clearance Requested:   - Pharmacy:  Hold Apixaban (Eliquis) NOT INDICATED HOW LONG   Type of Anesthesia:  General    Additional requests/questions:    Astrid Divine   09/08/2022, 2:13 PM

## 2022-09-08 NOTE — Telephone Encounter (Signed)
   Patient Name: Ann Vang  DOB: 03-22-1946 MRN: [id number]  Primary Cardiologist: [name], MD  Clinical pharmacists have reviewed the patient's past medical history, labs, and current medications as part of preoperative protocol coverage. The following recommendations have been made:   Patient with diagnosis of afib on Eliquis for anticoagulation.     Procedure: E/O LIPOMA SURGERY  Date of procedure: TBD  CHA2DS2-VASc Score = 3  This indicates a 3.2% annual risk of stroke. The patient's score is based upon: CHF History: 0 HTN History: 0 Diabetes History: 0 Stroke History: 0 Vascular Disease History: 0 Age Score: 2 Gender Score: 1       CrCl 76 ml/min   Per office protocol, patient can hold Eliquis for 2 days prior to procedure. Please resume Eliquis as soon as possible postprocedure, at the discretion of the surgeon.     I will route this recommendation to the requesting party via Epic fax function and remove from pre-op pool.  Please call with questions.  Lenna Sciara, NP 09/08/2022, 4:26 PM

## 2022-09-08 NOTE — Telephone Encounter (Signed)
Patient with diagnosis of afib on Eliquis for anticoagulation.    Procedure: E/O LIPOMA SURGERY  Date of procedure: TBD   CHA2DS2-VASc Score = 3   This indicates a 3.2% annual risk of stroke. The patient's score is based upon: CHF History: 0 HTN History: 0 Diabetes History: 0 Stroke History: 0 Vascular Disease History: 0 Age Score: 2 Gender Score: 1      CrCl 76 ml/min  Per office protocol, patient can hold Eliquis for 2 days prior to procedure.    **This guidance is not considered finalized until pre-operative APP has relayed final recommendations.**

## 2022-09-09 DIAGNOSIS — R311 Benign essential microscopic hematuria: Secondary | ICD-10-CM | POA: Diagnosis not present

## 2022-09-09 DIAGNOSIS — D3002 Benign neoplasm of left kidney: Secondary | ICD-10-CM | POA: Diagnosis not present

## 2022-09-09 DIAGNOSIS — R310 Gross hematuria: Secondary | ICD-10-CM | POA: Diagnosis not present

## 2022-09-09 LAB — COMPREHENSIVE METABOLIC PANEL
ALT: 14 IU/L (ref 0–32)
AST: 20 IU/L (ref 0–40)
Albumin/Globulin Ratio: 2.1 (ref 1.2–2.2)
Albumin: 4.2 g/dL (ref 3.8–4.8)
Alkaline Phosphatase: 53 IU/L (ref 44–121)
BUN/Creatinine Ratio: 26 (ref 12–28)
BUN: 19 mg/dL (ref 8–27)
Bilirubin Total: 0.6 mg/dL (ref 0.0–1.2)
CO2: 23 mmol/L (ref 20–29)
Calcium: 9.5 mg/dL (ref 8.7–10.3)
Chloride: 105 mmol/L (ref 96–106)
Creatinine, Ser: 0.72 mg/dL (ref 0.57–1.00)
Globulin, Total: 2 g/dL (ref 1.5–4.5)
Glucose: 115 mg/dL — ABNORMAL HIGH (ref 70–99)
Potassium: 3.9 mmol/L (ref 3.5–5.2)
Sodium: 145 mmol/L — ABNORMAL HIGH (ref 134–144)
Total Protein: 6.2 g/dL (ref 6.0–8.5)
eGFR: 87 mL/min/{1.73_m2} (ref >59)

## 2022-09-09 LAB — TSH: TSH: 1.33 u[IU]/mL (ref 0.450–4.500)

## 2022-09-15 DIAGNOSIS — K5730 Diverticulosis of large intestine without perforation or abscess without bleeding: Secondary | ICD-10-CM | POA: Diagnosis not present

## 2022-09-15 DIAGNOSIS — R310 Gross hematuria: Secondary | ICD-10-CM | POA: Diagnosis not present

## 2022-09-15 DIAGNOSIS — N200 Calculus of kidney: Secondary | ICD-10-CM | POA: Diagnosis not present

## 2022-09-18 DIAGNOSIS — N200 Calculus of kidney: Secondary | ICD-10-CM | POA: Diagnosis not present

## 2022-09-18 DIAGNOSIS — R310 Gross hematuria: Secondary | ICD-10-CM | POA: Diagnosis not present

## 2022-09-18 DIAGNOSIS — D3002 Benign neoplasm of left kidney: Secondary | ICD-10-CM | POA: Diagnosis not present

## 2022-10-06 DIAGNOSIS — Z124 Encounter for screening for malignant neoplasm of cervix: Secondary | ICD-10-CM | POA: Diagnosis not present

## 2022-10-06 DIAGNOSIS — Z01419 Encounter for gynecological examination (general) (routine) without abnormal findings: Secondary | ICD-10-CM | POA: Diagnosis not present

## 2022-10-10 ENCOUNTER — Other Ambulatory Visit: Payer: Self-pay

## 2022-10-10 DIAGNOSIS — I4891 Unspecified atrial fibrillation: Secondary | ICD-10-CM

## 2022-10-10 MED ORDER — APIXABAN 5 MG PO TABS: 5.0000 mg | ORAL_TABLET | Freq: Two times a day (BID) | ORAL | 1 refills | Status: DC

## 2022-10-10 MED ORDER — AMIODARONE HCL 200 MG PO TABS: 100.0000 mg | ORAL_TABLET | Freq: Every day | ORAL | 2 refills | Status: DC

## 2022-10-10 NOTE — Telephone Encounter (Signed)
Prescription refill request for Eliquis received. Indication: Afib  Last office visit: 03/27/22 [name])  Scr: 0.72 (09/08/22)  Age: 77 Weight: 74.4kg  Appropriate dose. Refill sent.

## 2022-10-10 NOTE — Telephone Encounter (Signed)
Pt's medication was sent to pt's pharmacy as requested. Confirmation received.  °

## 2022-10-14 DIAGNOSIS — K080 Exfoliation of teeth due to systemic causes: Secondary | ICD-10-CM | POA: Diagnosis not present

## 2022-10-20 DIAGNOSIS — M1711 Unilateral primary osteoarthritis, right knee: Secondary | ICD-10-CM | POA: Diagnosis not present

## 2023-01-01 DIAGNOSIS — Z1212 Encounter for screening for malignant neoplasm of rectum: Secondary | ICD-10-CM | POA: Diagnosis not present

## 2023-01-08 DIAGNOSIS — R310 Gross hematuria: Secondary | ICD-10-CM | POA: Diagnosis not present

## 2023-01-08 DIAGNOSIS — D1771 Benign lipomatous neoplasm of kidney: Secondary | ICD-10-CM | POA: Diagnosis not present

## 2023-01-21 DIAGNOSIS — D1771 Benign lipomatous neoplasm of kidney: Secondary | ICD-10-CM | POA: Diagnosis not present

## 2023-01-22 ENCOUNTER — Ambulatory Visit: Payer: Medicare Other | Admitting: Podiatry

## 2023-01-22 DIAGNOSIS — L84 Corns and callosities: Secondary | ICD-10-CM

## 2023-01-22 DIAGNOSIS — M205X2 Other deformities of toe(s) (acquired), left foot: Secondary | ICD-10-CM

## 2023-01-22 NOTE — Progress Notes (Signed)
Subjective: Chief Complaint  Patient presents with   Callouses    Left foot 3rd toe callus or corn causing some discomfort    77 year old female presents for above concerns.  She was last seen in April 2022 for the same issue but over the last month or so the calluses come back causing discomfort.  No injuries or changes otherwise.  No drainage or pus or any open lesions.  Objective: AAO x3, NAD DP/PT pulses palpable bilaterally, CRT less than 3 seconds Adductovarus present lesser digits.  Hyperkeratotic lesion along the left fourth toe without any underlying ulceration drainage or signs of infection but is preulcerative. No pain with calf compression, swelling, warmth, erythema  Assessment: Preulcerative lesion left fourth toe  Plan: -All treatment options discussed with the patient including all alternatives, risks, complications.  -Sharp debrided hyperkeratotic lesion x 1 without any complications or bleeding.  The area is preulcerative that she needs to offload this and discussed the modifications as well. -Monitor for any clinical signs or symptoms of infection and directed to call the office immediately should any occur or go to the ER. -Patient encouraged to call the office with any questions, concerns, change in symptoms.   Ann Vang DPM

## 2023-02-10 DIAGNOSIS — D3131 Benign neoplasm of right choroid: Secondary | ICD-10-CM | POA: Diagnosis not present

## 2023-02-10 DIAGNOSIS — H52203 Unspecified astigmatism, bilateral: Secondary | ICD-10-CM | POA: Diagnosis not present

## 2023-02-10 DIAGNOSIS — H35373 Puckering of macula, bilateral: Secondary | ICD-10-CM | POA: Diagnosis not present

## 2023-02-10 DIAGNOSIS — H2513 Age-related nuclear cataract, bilateral: Secondary | ICD-10-CM | POA: Diagnosis not present

## 2023-03-09 DIAGNOSIS — M1711 Unilateral primary osteoarthritis, right knee: Secondary | ICD-10-CM | POA: Diagnosis not present

## 2023-03-10 ENCOUNTER — Emergency Department (HOSPITAL_COMMUNITY): Payer: Medicare Other

## 2023-03-10 ENCOUNTER — Inpatient Hospital Stay (HOSPITAL_BASED_OUTPATIENT_CLINIC_OR_DEPARTMENT_OTHER)
Admission: EM | Admit: 2023-03-10 | Discharge: 2023-03-13 | DRG: 674 | Disposition: A | Discharge status: Home / Self Care | Payer: Medicare Other | Attending: Internal Medicine | Admitting: Internal Medicine

## 2023-03-10 ENCOUNTER — Other Ambulatory Visit: Payer: Self-pay

## 2023-03-10 ENCOUNTER — Emergency Department (HOSPITAL_BASED_OUTPATIENT_CLINIC_OR_DEPARTMENT_OTHER): Payer: Medicare Other

## 2023-03-10 ENCOUNTER — Encounter (HOSPITAL_BASED_OUTPATIENT_CLINIC_OR_DEPARTMENT_OTHER): Payer: Self-pay | Admitting: Emergency Medicine

## 2023-03-10 DIAGNOSIS — Z79890 Hormone replacement therapy (postmenopausal): Secondary | ICD-10-CM | POA: Diagnosis not present

## 2023-03-10 DIAGNOSIS — M1990 Unspecified osteoarthritis, unspecified site: Secondary | ICD-10-CM | POA: Diagnosis not present

## 2023-03-10 DIAGNOSIS — Z8249 Family history of ischemic heart disease and other diseases of the circulatory system: Secondary | ICD-10-CM | POA: Diagnosis not present

## 2023-03-10 DIAGNOSIS — T45515A Adverse effect of anticoagulants, initial encounter: Secondary | ICD-10-CM | POA: Diagnosis present

## 2023-03-10 DIAGNOSIS — I480 Paroxysmal atrial fibrillation: Secondary | ICD-10-CM | POA: Diagnosis present

## 2023-03-10 DIAGNOSIS — Z87891 Personal history of nicotine dependence: Secondary | ICD-10-CM | POA: Diagnosis not present

## 2023-03-10 DIAGNOSIS — Z79899 Other long term (current) drug therapy: Secondary | ICD-10-CM

## 2023-03-10 DIAGNOSIS — Z7901 Long term (current) use of anticoagulants: Secondary | ICD-10-CM

## 2023-03-10 DIAGNOSIS — R7989 Other specified abnormal findings of blood chemistry: Secondary | ICD-10-CM | POA: Diagnosis present

## 2023-03-10 DIAGNOSIS — S37012A Minor contusion of left kidney, initial encounter: Secondary | ICD-10-CM | POA: Diagnosis not present

## 2023-03-10 DIAGNOSIS — M810 Age-related osteoporosis without current pathological fracture: Secondary | ICD-10-CM | POA: Diagnosis not present

## 2023-03-10 DIAGNOSIS — E876 Hypokalemia: Secondary | ICD-10-CM | POA: Diagnosis present

## 2023-03-10 DIAGNOSIS — D1771 Benign lipomatous neoplasm of kidney: Secondary | ICD-10-CM | POA: Diagnosis present

## 2023-03-10 DIAGNOSIS — Z800 Family history of malignant neoplasm of digestive organs: Secondary | ICD-10-CM

## 2023-03-10 DIAGNOSIS — K5730 Diverticulosis of large intestine without perforation or abscess without bleeding: Secondary | ICD-10-CM | POA: Diagnosis present

## 2023-03-10 DIAGNOSIS — E871 Hypo-osmolality and hyponatremia: Secondary | ICD-10-CM | POA: Diagnosis not present

## 2023-03-10 DIAGNOSIS — N200 Calculus of kidney: Secondary | ICD-10-CM | POA: Diagnosis not present

## 2023-03-10 DIAGNOSIS — E872 Acidosis, unspecified: Secondary | ICD-10-CM | POA: Diagnosis not present

## 2023-03-10 DIAGNOSIS — I10 Essential (primary) hypertension: Secondary | ICD-10-CM | POA: Insufficient documentation

## 2023-03-10 DIAGNOSIS — R109 Unspecified abdominal pain: Secondary | ICD-10-CM | POA: Diagnosis not present

## 2023-03-10 DIAGNOSIS — T148XXA Other injury of unspecified body region, initial encounter: Secondary | ICD-10-CM | POA: Diagnosis present

## 2023-03-10 DIAGNOSIS — Z8262 Family history of osteoporosis: Secondary | ICD-10-CM | POA: Diagnosis not present

## 2023-03-10 DIAGNOSIS — D6832 Hemorrhagic disorder due to extrinsic circulating anticoagulants: Secondary | ICD-10-CM | POA: Diagnosis present

## 2023-03-10 DIAGNOSIS — K5790 Diverticulosis of intestine, part unspecified, without perforation or abscess without bleeding: Secondary | ICD-10-CM

## 2023-03-10 DIAGNOSIS — Z820 Family history of epilepsy and other diseases of the nervous system: Secondary | ICD-10-CM | POA: Diagnosis not present

## 2023-03-10 DIAGNOSIS — R110 Nausea: Secondary | ICD-10-CM | POA: Diagnosis not present

## 2023-03-10 DIAGNOSIS — K7689 Other specified diseases of liver: Secondary | ICD-10-CM | POA: Diagnosis not present

## 2023-03-10 DIAGNOSIS — N2889 Other specified disorders of kidney and ureter: Secondary | ICD-10-CM | POA: Diagnosis not present

## 2023-03-10 DIAGNOSIS — E875 Hyperkalemia: Secondary | ICD-10-CM | POA: Diagnosis not present

## 2023-03-10 DIAGNOSIS — I4891 Unspecified atrial fibrillation: Secondary | ICD-10-CM

## 2023-03-10 HISTORY — PX: IR EMBO ART  VEN HEMORR LYMPH EXTRAV  INC GUIDE ROADMAPPING: IMG5450

## 2023-03-10 HISTORY — PX: IR RENAL SELECTIVE  UNI INC S&I MOD SED: IMG654

## 2023-03-10 HISTORY — PX: IR US GUIDE VASC ACCESS RIGHT: IMG2390

## 2023-03-10 HISTORY — PX: IR ANGIOGRAM SELECTIVE EACH ADDITIONAL VESSEL: IMG667

## 2023-03-10 LAB — BPAM RBC
Blood Product Expiration Date: 202408222359
Unit Type and Rh: 5100
Unit Type and Rh: 5100

## 2023-03-10 LAB — LIPASE, BLOOD: Lipase: 26 U/L (ref 11–51)

## 2023-03-10 LAB — CBC
HCT: 41.5 % (ref 36.0–46.0)
Hemoglobin: 14 g/dL (ref 12.0–15.0)
MCH: 31.9 pg (ref 26.0–34.0)
MCHC: 33.7 g/dL (ref 30.0–36.0)
MCV: 94.5 fL (ref 80.0–100.0)
Platelets: 185 10*3/uL (ref 150–400)
RBC: 4.39 MIL/uL (ref 3.87–5.11)
RDW: 12.3 % (ref 11.5–15.5)
WBC: 10.9 10*3/uL — ABNORMAL HIGH (ref 4.0–10.5)
nRBC: 0 % (ref 0.0–0.2)

## 2023-03-10 LAB — HEMOGLOBIN AND HEMATOCRIT, BLOOD
HCT: 38.3 % (ref 36.0–46.0)
HCT: 38.5 % (ref 36.0–46.0)
Hemoglobin: 12.4 g/dL (ref 12.0–15.0)
Hemoglobin: 12.6 g/dL (ref 12.0–15.0)

## 2023-03-10 LAB — COMPREHENSIVE METABOLIC PANEL
ALT: 17 U/L (ref 0–44)
ALT: 14 U/L (ref 0–44)
AST: 22 U/L (ref 15–41)
AST: 21 U/L (ref 15–41)
Albumin: 4.4 g/dL (ref 3.5–5.0)
Albumin: 3.8 g/dL (ref 3.5–5.0)
Alkaline Phosphatase: 49 U/L (ref 38–126)
Alkaline Phosphatase: 43 U/L (ref 38–126)
Anion gap: 13 (ref 5–15)
Anion gap: 12 (ref 5–15)
BUN: 27 mg/dL — ABNORMAL HIGH (ref 8–23)
BUN: 30 mg/dL — ABNORMAL HIGH (ref 8–23)
CO2: 21 mmol/L — ABNORMAL LOW (ref 22–32)
CO2: 18 mmol/L — ABNORMAL LOW (ref 22–32)
Calcium: 9.2 mg/dL (ref 8.9–10.3)
Calcium: 8.7 mg/dL — ABNORMAL LOW (ref 8.9–10.3)
Chloride: 100 mmol/L (ref 98–111)
Chloride: 106 mmol/L (ref 98–111)
Creatinine, Ser: 0.77 mg/dL (ref 0.44–1.00)
Creatinine, Ser: 1.17 mg/dL — ABNORMAL HIGH (ref 0.44–1.00)
GFR, Estimated: >60 mL/min (ref >60)
GFR, Estimated: 48 mL/min — ABNORMAL LOW (ref >60)
Glucose, Bld: 145 mg/dL — ABNORMAL HIGH (ref 70–99)
Glucose, Bld: 197 mg/dL — ABNORMAL HIGH (ref 70–99)
Potassium: 3.4 mmol/L — ABNORMAL LOW (ref 3.5–5.1)
Potassium: 5.2 mmol/L — ABNORMAL HIGH (ref 3.5–5.1)
Sodium: 134 mmol/L — ABNORMAL LOW (ref 135–145)
Sodium: 136 mmol/L (ref 135–145)
Total Bilirubin: 1.1 mg/dL (ref 0.3–1.2)
Total Bilirubin: 0.8 mg/dL (ref 0.3–1.2)
Total Protein: 7.2 g/dL (ref 6.5–8.1)
Total Protein: 6.1 g/dL — ABNORMAL LOW (ref 6.5–8.1)

## 2023-03-10 LAB — TYPE AND SCREEN
ABO/RH(D): B NEG
Antibody Screen: NEGATIVE
Unit division: 0
Unit division: 0

## 2023-03-10 LAB — PROTIME-INR
INR: 1.2 (ref 0.8–1.2)
Prothrombin Time: 15.9 seconds — ABNORMAL HIGH (ref 11.4–15.2)

## 2023-03-10 LAB — PREPARE RBC (CROSSMATCH)

## 2023-03-10 LAB — APTT: aPTT: 27 seconds (ref 24–36)

## 2023-03-10 MED ORDER — DOCUSATE SODIUM 100 MG PO CAPS
100.0000 mg | ORAL_CAPSULE | Freq: Two times a day (BID) | ORAL | Status: DC
  Administered 2023-03-10 – 2023-03-13 (×6): 100 mg via ORAL
  Filled 2023-03-10 (×6): qty 1

## 2023-03-10 MED ORDER — FENTANYL CITRATE (PF) 100 MCG/2ML IJ SOLN
INTRAMUSCULAR | Status: AC
  Filled 2023-03-10: qty 2

## 2023-03-10 MED ORDER — MIDAZOLAM HCL 2 MG/2ML IJ SOLN
INTRAMUSCULAR | Status: AC | PRN
  Administered 2023-03-10 (×2): 1 mg via INTRAVENOUS

## 2023-03-10 MED ORDER — MIDAZOLAM HCL 2 MG/2ML IJ SOLN
INTRAMUSCULAR | Status: AC
  Filled 2023-03-10: qty 2

## 2023-03-10 MED ORDER — PROTHROMBIN COMPLEX CONC HUMAN 500 UNITS IV KIT
3805.0000 [IU] | PACK | Status: AC
  Administered 2023-03-10: 3805 [IU] via INTRAVENOUS
  Filled 2023-03-10: qty 3805

## 2023-03-10 MED ORDER — HYDRALAZINE HCL 20 MG/ML IJ SOLN: 10.0000 mg | Freq: Four times a day (QID) | INTRAMUSCULAR | Status: DC | PRN

## 2023-03-10 MED ORDER — IOHEXOL 300 MG/ML  SOLN
100.0000 mL | Freq: Once | INTRAMUSCULAR | Status: AC | PRN
  Administered 2023-03-10: 100 mL via INTRAVENOUS

## 2023-03-10 MED ORDER — MORPHINE SULFATE (PF) 4 MG/ML IV SOLN
4.0000 mg | Freq: Once | INTRAVENOUS | Status: AC
  Administered 2023-03-10: 4 mg via INTRAVENOUS
  Filled 2023-03-10: qty 1

## 2023-03-10 MED ORDER — SODIUM CHLORIDE 0.9 % IV SOLN: 250.0000 mL | INTRAVENOUS | Status: DC | PRN

## 2023-03-10 MED ORDER — SODIUM CHLORIDE 0.9% IV SOLUTION: Freq: Once | INTRAVENOUS | Status: DC

## 2023-03-10 MED ORDER — SODIUM CHLORIDE 0.9% FLUSH
3.0000 mL | Freq: Two times a day (BID) | INTRAVENOUS | Status: DC
  Administered 2023-03-11 – 2023-03-12 (×3): 3 mL via INTRAVENOUS

## 2023-03-10 MED ORDER — ACETAMINOPHEN 325 MG PO TABS
650.0000 mg | ORAL_TABLET | Freq: Four times a day (QID) | ORAL | Status: DC | PRN
  Administered 2023-03-12 (×2): 650 mg via ORAL
  Filled 2023-03-10 (×2): qty 2

## 2023-03-10 MED ORDER — PROTHROMBIN COMPLEX CONC HUMAN 500 UNITS IV KIT
PACK | INTRAVENOUS | Status: AC
  Filled 2023-03-10: qty 4000

## 2023-03-10 MED ORDER — IOHEXOL 300 MG/ML  SOLN
150.0000 mL | Freq: Once | INTRAMUSCULAR | Status: AC | PRN
  Administered 2023-03-10: 56 mL via INTRA_ARTERIAL

## 2023-03-10 MED ORDER — LIDOCAINE HCL 1 % IJ SOLN
INTRAMUSCULAR | Status: AC
  Filled 2023-03-10: qty 20

## 2023-03-10 MED ORDER — FENTANYL CITRATE PF 50 MCG/ML IJ SOSY
25.0000 ug | PREFILLED_SYRINGE | Freq: Once | INTRAMUSCULAR | Status: AC
  Administered 2023-03-10: 25 ug via INTRAVENOUS
  Filled 2023-03-10: qty 1

## 2023-03-10 MED ORDER — SODIUM CHLORIDE 0.9% FLUSH: 3.0000 mL | INTRAVENOUS | Status: DC | PRN

## 2023-03-10 MED ORDER — AMIODARONE HCL 200 MG PO TABS
100.0000 mg | ORAL_TABLET | Freq: Every day | ORAL | Status: DC
  Administered 2023-03-11 – 2023-03-13 (×3): 100 mg via ORAL
  Filled 2023-03-10 (×3): qty 1

## 2023-03-10 MED ORDER — IOHEXOL 300 MG/ML  SOLN
100.0000 mL | Freq: Once | INTRAMUSCULAR | Status: AC | PRN
  Administered 2023-03-10: 20 mL via INTRA_ARTERIAL

## 2023-03-10 MED ORDER — POTASSIUM CHLORIDE CRYS ER 20 MEQ PO TBCR
40.0000 meq | EXTENDED_RELEASE_TABLET | Freq: Once | ORAL | Status: AC
  Administered 2023-03-10: 40 meq via ORAL
  Filled 2023-03-10: qty 2

## 2023-03-10 MED ORDER — FENTANYL CITRATE (PF) 100 MCG/2ML IJ SOLN
INTRAMUSCULAR | Status: AC | PRN
  Administered 2023-03-10 (×2): 50 ug via INTRAVENOUS

## 2023-03-10 MED ORDER — IOHEXOL 300 MG/ML  SOLN: 125.0000 mL | Freq: Once | INTRAMUSCULAR | Status: DC | PRN

## 2023-03-10 MED ORDER — ONDANSETRON 4 MG PO TBDP
4.0000 mg | ORAL_TABLET | Freq: Once | ORAL | Status: AC | PRN
  Administered 2023-03-10: 4 mg via ORAL
  Filled 2023-03-10: qty 1

## 2023-03-10 MED ORDER — ACETAMINOPHEN 650 MG RE SUPP: 650.0000 mg | Freq: Four times a day (QID) | RECTAL | Status: DC | PRN

## 2023-03-10 MED ORDER — HYDROMORPHONE HCL 1 MG/ML IJ SOLN
1.0000 mg | Freq: Once | INTRAMUSCULAR | Status: AC
  Administered 2023-03-10: 1 mg via INTRAVENOUS
  Filled 2023-03-10: qty 1

## 2023-03-10 MED ORDER — MORPHINE SULFATE (PF) 2 MG/ML IV SOLN
2.0000 mg | INTRAVENOUS | Status: DC | PRN
  Administered 2023-03-11 (×3): 2 mg via INTRAVENOUS
  Filled 2023-03-10 (×3): qty 1

## 2023-03-10 MED ORDER — LIDOCAINE HCL 1 % IJ SOLN
20.0000 mL | Freq: Once | INTRAMUSCULAR | Status: AC
  Administered 2023-03-10: 10 mL via INTRADERMAL

## 2023-03-10 NOTE — ED Notes (Signed)
Pt arrives via [facility] for generalized abd pain and nausea. Here for IR.

## 2023-03-10 NOTE — ED Notes (Signed)
Hot packs for hands  02 reading low applied hot packs for cold hands

## 2023-03-10 NOTE — Sedation Documentation (Signed)
Assumed care of patient.

## 2023-03-10 NOTE — H&P (Incomplete)
History and Physical    Ann Vang ZDG:[id number] DOB: February 10, 1946 DOA: 03/10/2023  PCP: Alysia Penna, MD   Patient coming from: Home   Chief Complaint:  Chief Complaint  Patient presents with   Abdominal Pain    HPI:  Ann Vang is a 77 y.o. female with medical history significant of angiomyolipoma of the kidney, atrial fibrillation on on Eliquis and amiodarone, essential hypertension, diverticulosis and chronic arthritis presented to emergency department at [facility] around 2 PM on 03/10/2023 with complaining of abdominal pain.Patient states she started to have abdominal pain around noon today.  Pain is located in the lower abdomen and epigastric area.  She describes as sharp, constant, nonradiating.  She endorses nausea without vomiting.  States he ate an egg this morning that she peeled 4 days ago. Last bowel movement was this morning. She denies any fever, chest pain, shortness of breath, constipation, diarrhea, blood in her stool. She states she saw blood in her urine but that is not new for her, states she was found to have a lesion tumor in her kidney.  Workup in the ED CT abdomen pelvis showed left renal hematoma with active extravasation.  Interventional radiology consulted Dr. Joanne Gavel, radiology recommended Kcentra for Eliquis reversal and patient was transfer from ED to ED at [facility].  Patient underwent left renal angiogram and peripheral branch embolization and 3 mm coil placement by interventional radiologist Dr. Miles Costain around 7:44 PM 7/23.   ED Course:  At initial presentation to ED patient is hemodynamically stable. Per chart review.  Hemodynamically stable-heart rate 81, respiratory 16, blood pressure 130s/68 and O2 sat 96% at room air.  Initial lab work at 1 PM CBC WBC 10.9, hemoglobin 14, hematocrit 41 and platelet 185.  aPTT 27 and INR 1.2 WNL.  Elevated pro time 15.9.  EDP physician has been consulted hospitalist to admit for  continuation of further care.  Review of Systems:  Review of Systems  Constitutional:  Negative for chills and fever.  Respiratory:  Negative for cough.   Cardiovascular:  Negative for chest pain and palpitations.  Gastrointestinal:  Negative for heartburn and nausea.  Skin:  Negative for itching and rash.  Psychiatric/Behavioral:  Positive for suicidal ideas.     Past Medical History:  Diagnosis Date   Abnormal breast finding 12/1998   left breast abnl   Arthritis    --basal joint--right hand   Carpal tunnel syndrome, right    Compression fracture of T2 vertebra (HCC) 05/10/2019   Diverticulosis 2003   mild   Lumbar compression fracture (HCC) 07/13/2012   Lumbar disc disorder 2000   bulging   Melena    Menopause    Osteoporosis    Fx L4 age 58 water skiing accident   Palpitations    Thyroid disease 10/2014   thyroid nodules--bx'd large nodule and was benign--sees Gen. Careers adviser with [facility]    Past Surgical History:  Procedure Laterality Date   AUGMENTATION MAMMAPLASTY  1983   Implants have been removed   BREAST SURGERY  2010   implants removed ruptured   CERVICAL BIOPSY  W/ LOOP ELECTRODE EXCISION  1996   CIN   IR EMBO TUMOR ORGAN ISCHEMIA INFARCT INC GUIDE ROADMAPPING  05/13/2017   IR RADIOLOGIST EVAL & MGMT  04/15/2017   IR RADIOLOGIST EVAL & MGMT  05/27/2017   IR RADIOLOGIST EVAL & MGMT  01/21/2018   IR RADIOLOGIST EVAL & MGMT  09/28/2019   IR RADIOLOGIST EVAL & MGMT  10/07/2021   IR RENAL SUPRASEL UNI S&I MOD SED  05/13/2017   IR US GUIDE VASC ACCESS LEFT  05/13/2017   KNEE SURGERY  2012   meniscus rt knee (both)   L-2 fracture  2009   fall in aerobics   L-4 fracture   water skiing     TENOSYNOVECTOMY Right 03/11/2016   Procedure: TENOSYNOVECTOMY FLEXOR RIGHT RING FINGER;  Surgeon: Cindee Salt, MD;  Location: [facility];  Service: Orthopedics;  Laterality: Right;   thyroid nodule biopsy  10/2014   --[facility], [place]--   TONSILLECTOMY AND  ADENOIDECTOMY  1964     reports that she quit smoking about 50 years ago. Her smoking use included cigarettes. She has never used smokeless tobacco. She reports current alcohol use of about 14.0 standard drinks of alcohol per week. She reports that she does not use drugs.  No Known Allergies  Family History  Problem Relation Age of Onset   Osteoporosis Mother    Alzheimer's disease Mother    Hypertension Father    Heart attack Father    Osteoporosis Maternal Grandmother    Colon cancer Maternal Grandmother    Colon cancer Other     Prior to Admission medications   Medication Sig Start Date End Date Taking? Authorizing Provider  amiodarone (PACERONE) 200 MG tablet Take 0.5 tablets (100 mg total) by mouth daily. 10/10/22  Yes Corky Crafts, MD  apixaban (ELIQUIS) 5 MG TABS tablet Take 1 tablet (5 mg total) by mouth 2 (two) times daily. 10/10/22  Yes Corky Crafts, MD  Bacillus Coagulans-Inulin (PROBIOTIC) 1-250 BILLION-MG CAPS Take 1 capsule by mouth daily.   Yes [provider]  Cholecalciferol (VITAMIN D) 2000 UNITS tablet Take 2,000 Units by mouth daily.   Yes [provider]  estradiol (VIVELLE-DOT) 0.0375 MG/24HR Place 1 patch onto the skin 2 (two) times a week. 04/16/21  Yes [provider]  fluticasone (FLONASE) 50 MCG/ACT nasal spray Place 2 sprays into the nose daily. Patient taking differently: Place 2 sprays into the nose daily as needed for allergies. 07/13/12  Yes Hodgin, Acie Fredrickson, MD  magnesium gluconate (MAGONATE) 500 MG tablet Take 500 mg by mouth 2 (two) times daily.   Yes [provider]  milk thistle 175 MG tablet Take 175 mg by mouth daily.   Yes [provider]  OVER THE COUNTER MEDICATION Take 1 tablet by mouth daily at 6 (six) AM. Medication: Vitamin K7   Yes [provider]  progesterone (PROMETRIUM) 100 MG capsule TAKE ONE CAPSULE BY MOUTH DAILY Patient taking differently: Take 100 mg by mouth  daily. 02/23/19  Yes Jerene Bears, MD     Physical Exam: Vitals:   03/10/23 1925 03/10/23 1930 03/10/23 1935 03/10/23 2000  BP: 109/66 114/67 116/68 113/68  Pulse: 82 83 86 81  Resp: 14 11 19 16   Temp:      TempSrc:      SpO2: 100% 100% 98% 96%  Weight:      Height:        Physical Exam   Labs on Admission: I have personally reviewed following labs and imaging studies  CBC: Recent Labs  Lab 03/10/23 1359 03/10/23 2029  WBC 10.9*  --   HGB 14.0 12.4  HCT 41.5 38.3  MCV 94.5  --   PLT 185  --    Basic Metabolic Panel: Recent Labs  Lab 03/10/23 1359  NA 134*  K 3.4*  CL 100  CO2 21*  GLUCOSE 145*  BUN 27*  CREATININE 0.77  CALCIUM 9.2   GFR: Estimated Creatinine Clearance: 62.5 mL/min (by C-G formula based on SCr of 0.77 mg/dL). Liver Function Tests: Recent Labs  Lab 03/10/23 1359  AST 22  ALT 17  ALKPHOS 49  BILITOT 1.1  PROT 7.2  ALBUMIN 4.4   Recent Labs  Lab 03/10/23 1359  LIPASE 26   No results for input(s): "AMMONIA" in the last 168 hours. Coagulation Profile: Recent Labs  Lab 03/10/23 1627  INR 1.2   Cardiac Enzymes: No results for input(s): "CKTOTAL", "CKMB", "CKMBINDEX", "TROPONINI", "TROPONINIHS" in the last 168 hours. BNP (last 3 results) No results for input(s): "BNP" in the last 8760 hours. HbA1C: No results for input(s): "HGBA1C" in the last 72 hours. CBG: No results for input(s): "GLUCAP" in the last 168 hours. Lipid Profile: No results for input(s): "CHOL", "HDL", "LDLCALC", "TRIG", "CHOLHDL", "LDLDIRECT" in the last 72 hours. Thyroid Function Tests: No results for input(s): "TSH", "T4TOTAL", "FREET4", "T3FREE", "THYROIDAB" in the last 72 hours. Anemia Panel: No results for input(s): "VITAMINB12", "FOLATE", "FERRITIN", "TIBC", "IRON", "RETICCTPCT" in the last 72 hours. Urine analysis:    Component Value Date/Time   BILIRUBINUR ne 12/30/2013 1156   PROTEINUR neg 12/30/2013 1156   UROBILINOGEN negative 12/30/2013 1156    NITRITE neg 12/30/2013 1156   LEUKOCYTESUR Negative 12/30/2013 1156    Radiological Exams on Admission: I have personally reviewed images CT ABDOMEN PELVIS W CONTRAST  Result Date: 03/10/2023 CLINICAL DATA:  Acute generalized abdominal pain. EXAM: CT ABDOMEN AND PELVIS WITH CONTRAST TECHNIQUE: Multidetector CT imaging of the abdomen and pelvis was performed using the standard protocol following bolus administration of intravenous contrast. RADIATION DOSE REDUCTION: This exam was performed according to the departmental dose-optimization program which includes automated exposure control, adjustment of the mA and/or kV according to patient size and/or use of iterative reconstruction technique. CONTRAST:  OMNIPAQUE IOHEXOL 300 MG/ML  SOLN COMPARISON:  September 15, 2022. FINDINGS: Lower chest: No acute abnormality. Hepatobiliary: No cholelithiasis or biliary dilatation is noted. Multiple hepatic cysts are again noted. Pancreas: Unremarkable. No pancreatic ductal dilatation or surrounding inflammatory changes. Spleen: Normal in size without focal abnormality. Adrenals/Urinary Tract: Adrenal glands are unremarkable. Small nonobstructive right renal calculus is noted. No hydronephrosis. Urinary bladder is unremarkable. The patient is status post embolization of left renal angiomyolipoma according to prior exam. However, there is now interval development of large subcapsular hematoma and associated left perirenal hematoma superior to the left kidney measuring 7.3 x 5.3 cm. There does appear to be active extravasation of contrast suggesting active hemorrhage. Stomach/Bowel: Stomach is within normal limits. Appendix appears normal. No evidence of bowel wall thickening, distention, or inflammatory changes. Sigmoid diverticulosis without inflammation. Vascular/Lymphatic: Aortic atherosclerosis. No enlarged abdominal or pelvic lymph nodes. Reproductive: Uterus is unremarkable. Stable right adnexal mass is noted  with calcifications, most consistent with fibroid as noted on prior studies. Other: No ascites or hernia is noted. Musculoskeletal: Old T12, L2 and L4 fractures are noted. No acute osseous abnormality is noted. IMPRESSION: Patient is reportedly status post embolization of left renal angiomyolipoma according to prior exam. There is now interval development of large left renal subcapsular hematoma and associated left pararenal hematoma superior to the left kidney, active extravasation of contrast suggesting active hemorrhage. Critical Value/emergent results were called by telephone at the time of interpretation on 03/10/2023 at 4:08 pm to provider Dr. Charm Barges, who verbally acknowledged these results. Sigmoid diverticulosis without inflammation. Aortic Atherosclerosis (ICD10-I70.0).  Electronically Signed   By: Lupita Raider M.D.   On: 03/10/2023 16:10    EKG: My personal interpretation of EKG shows: ***    Assessment/Plan: Principal Problem:   Renal hematoma, left Active Problems:   Angiomyolipoma of kidney   Left renal hematoma with extravasation s/p embolectomy 03/10/2023   Paroxysmal atrial fibrillation on Eliquis [facility])   Diverticulosis   Essential hypertension    Assessment and Plan: No notes have been filed under this hospital service. Service: Hospitalist      DVT prophylaxis:  {Blank single:19197::"Lovenox","SQ Heparin","IV heparin gtts","Xarelto","Eliquis","Coumadin","SCDs","***"} Code Status:  {Blank single:19197::"Full Code","DNR with Intubation","DNR/DNI(Do NOT Intubate)","Comfort Care","***"} Diet:  Family Communication:  ***  Disposition Plan:  ***  Consults:  ***  Admission status:   {Blank single:19197::"Observation","Inpatient"}, {Blank single:19197::"Med-Surg","Telemetry bed","Step Down Unit"}  Severity of Illness: {Observation/Inpatient:21159}    Tereasa Coop, MD [facility]  How to contact the [facility] Attending or Consulting provider 7A - 7P or covering  provider during after hours 7P -7A, for this patient.  Check the care team in [facility] and look for a) attending/consulting [facility] provider listed and b) the [facility] team listed Log into [email or web address] and use [facility]'s universal password to access. If you do not have the password, please contact the hospital operator. Locate the [facility] provider you are looking for under [facility] and page to a number that you can be directly reached. If you still have difficulty reaching the provider, please page the [facility] (Director on Call) for the Hospitalists listed on amion for assistance.  03/10/2023, 9:46 PM

## 2023-03-10 NOTE — ED Notes (Signed)
Patient returned from IR

## 2023-03-10 NOTE — ED Triage Notes (Signed)
Pt endorses generalized sharp abd pain and nausea starting today. Last BM this morning that was smaller than normal. Pt did eat hard boiled egg that may have been peeled for a few days.

## 2023-03-10 NOTE — ED Provider Notes (Addendum)
  Physical Exam  BP 139/83   Pulse 90   Temp (!) 97.5 F (36.4 C) (Oral)   Resp 13   Ht 5' 6.5" (1.689 m)   Wt 74.8 kg   LMP 08/18/1994 (Approximate)   SpO2 97%   BMI 26.23 kg/m   Physical Exam  Procedures  Procedures  ED Course / MDM   Clinical Course as of 03/10/23 1749  Tue Mar 10, 2023  [id number] 77 year old female here with acute onset of abdominal pain and nausea.  CT showing left renal hematoma with active extravasation.  She is on Eliquis.  Interventional radiology would like her transferred to [facility] for procedure. Needs reversal of anticoagulation. [MB]    Clinical Course User Index [MB] Terrilee Files, MD   Medical Decision Making Amount and/or Complexity of Data Reviewed Labs: ordered. Radiology: ordered.  Risk Prescription drug management. Decision regarding hospitalization.   77 year old female presenting from an outside emergency department due to concern for GI bleed with a large left renal subcapsular hematoma and extravasation of contrast dye suggesting active bleeding.  The patient was status post Kcentra to reverse her Eliquis.  She arrived hemodynamically stable, emergently taken to IR for embolization.     She returned from IR HDS. Repeat H/H collected, Hgb dropped 1.5 pts to 12.4 from 14. She is currently hemodynamically stable.  Medicine consulted for admission for observation.   Ernie Avena, MD 03/10/23 Verna Czech    Ernie Avena, MD 03/10/23 2138

## 2023-03-10 NOTE — Procedures (Signed)
Interventional Radiology Procedure Note  Procedure: left renal angio and peripheral branch embo with 300-500 embospheres and 3mm coil    Complications: None  Estimated Blood Loss:  min  Findings: Successful embo of bleeding branch to the renal AML Full report in pacs     Sharen Counter, MD

## 2023-03-10 NOTE — ED Provider Notes (Cosign Needed Addendum)
[name] Provider Note   CSN: [id number] Arrival date & time: 03/10/23  1344     History  Chief Complaint  Patient presents with   Abdominal Pain    Ann Vang is a 77 y.o. female with a history of arthritis, diverticulosis, thyroid disease presents today for evaluation of abdominal pain.  Patient states she started to have abdominal pain around noon today.  Pain is located in the lower abdomen and epigastric area.  She describes as sharp, constant, nonradiating.  She endorses nausea without vomiting.  States he ate an egg this morning that she peeled 4 days ago. Last bowel movement was this morning. She denies any fever, chest pain, shortness of breath, constipation, diarrhea, blood in her stool. She states she saw blood in her urine but that is not new for her, states she was found to have a lesion/ tumor in her kidney.   Abdominal Pain     Past Medical History:  Diagnosis Date   Abnormal breast finding 12/1998   left breast abnl   Arthritis    --basal joint--right hand   Carpal tunnel syndrome, right    Compression fracture of T2 vertebra (HCC) 05/10/2019   Diverticulosis 2003   mild   Lumbar compression fracture (HCC) 07/13/2012   Lumbar disc disorder 2000   bulging   Melena    Menopause    Osteoporosis    Fx L4 age 72 water skiing accident   Palpitations    Thyroid disease 10/2014   thyroid nodules--bx'd large nodule and was benign--sees Gen. Careers adviser with [facility]   Past Surgical History:  Procedure Laterality Date   AUGMENTATION MAMMAPLASTY  1983   Implants have been removed   BREAST SURGERY  2010   implants removed ruptured   CERVICAL BIOPSY  W/ LOOP ELECTRODE EXCISION  1996   CIN   IR EMBO TUMOR ORGAN ISCHEMIA INFARCT INC GUIDE ROADMAPPING  05/13/2017   IR RADIOLOGIST EVAL & MGMT  04/15/2017   IR RADIOLOGIST EVAL & MGMT  05/27/2017   IR RADIOLOGIST EVAL & MGMT  01/21/2018   IR RADIOLOGIST EVAL & MGMT   09/28/2019   IR RADIOLOGIST EVAL & MGMT  10/07/2021   IR RENAL SUPRASEL UNI S&I MOD SED  05/13/2017   IR US GUIDE VASC ACCESS LEFT  05/13/2017   KNEE SURGERY  2012   meniscus rt knee (both)   L-2 fracture  2009   fall in aerobics   L-4 fracture   water skiing     TENOSYNOVECTOMY Right 03/11/2016   Procedure: TENOSYNOVECTOMY FLEXOR RIGHT RING FINGER;  Surgeon: Cindee Salt, MD;  Location: [facility];  Service: Orthopedics;  Laterality: Right;   thyroid nodule biopsy  10/2014   --[facility], [place]--   TONSILLECTOMY AND ADENOIDECTOMY  1964     Home Medications Prior to Admission medications   Medication Sig Start Date End Date Taking? Authorizing Provider  amiodarone (PACERONE) 200 MG tablet Take 0.5 tablets (100 mg total) by mouth daily. 10/10/22   Corky Crafts, MD  apixaban (ELIQUIS) 5 MG TABS tablet Take 1 tablet (5 mg total) by mouth 2 (two) times daily. 10/10/22   Corky Crafts, MD  Bacillus Coagulans-Inulin (PROBIOTIC) 1-250 BILLION-MG CAPS Take 1 capsule by mouth daily.    [provider]  Cholecalciferol (VITAMIN D) 2000 UNITS tablet Take 2,000 Units by mouth daily.    [provider]  estradiol (VIVELLE-DOT) 0.0375 MG/24HR Place 1 patch  onto the skin 2 (two) times a week. 04/16/21   [provider]  fluticasone (FLONASE) 50 MCG/ACT nasal spray Place 2 sprays into the nose daily. 07/13/12   Edwyna Perfect, MD  magnesium gluconate (MAGONATE) 500 MG tablet Take 500 mg by mouth 2 (two) times daily.    [provider]  milk thistle 175 MG tablet Take 175 mg by mouth daily.    [provider]  progesterone (PROMETRIUM) 100 MG capsule TAKE ONE CAPSULE BY MOUTH DAILY 02/23/19   Jerene Bears, MD  vitamin k 100 MCG tablet Take 300 mcg by mouth daily. Vitamin k7    [provider]      Allergies    Patient has no known allergies.    Review of Systems   Review of Systems  Gastrointestinal:  Positive for  abdominal pain.    Physical Exam Updated Vital Signs BP 136/73   Pulse 97   Temp (!) 97.5 F (36.4 C) (Oral)   Resp 20   Ht 5' 6.5" (1.689 m)   Wt 74.8 kg   LMP 08/18/1994 (Approximate)   SpO2 98%   BMI 26.23 kg/m  Physical Exam Vitals and nursing note reviewed.  Constitutional:      Appearance: Normal appearance.  HENT:     Head: Normocephalic and atraumatic.     Mouth/Throat:     Mouth: Mucous membranes are moist.  Eyes:     General: No scleral icterus. Cardiovascular:     Rate and Rhythm: Normal rate and regular rhythm.     Pulses: Normal pulses.     Heart sounds: Normal heart sounds.  Pulmonary:     Effort: Pulmonary effort is normal.     Breath sounds: Normal breath sounds.  Abdominal:     General: Abdomen is flat.     Palpations: Abdomen is soft.     Tenderness: There is abdominal tenderness in the right lower quadrant, epigastric area, suprapubic area and left lower quadrant.  Musculoskeletal:        General: No deformity.  Skin:    General: Skin is warm.     Findings: No rash.  Neurological:     General: No focal deficit present.     Mental Status: She is alert.  Psychiatric:        Mood and Affect: Mood normal.     ED Results / Procedures / Treatments   Labs (all labs ordered are listed, but only abnormal results are displayed) Labs Reviewed  COMPREHENSIVE METABOLIC PANEL - Abnormal; Notable for the following components:      Result Value   Sodium 134 (*)    Potassium 3.4 (*)    CO2 21 (*)    Glucose, Bld 145 (*)    BUN 27 (*)    All other components within normal limits  CBC - Abnormal; Notable for the following components:   WBC 10.9 (*)    All other components within normal limits  PROTIME-INR - Abnormal; Notable for the following components:   Prothrombin Time 15.9 (*)    All other components within normal limits  LIPASE, BLOOD  APTT  URINALYSIS, ROUTINE W REFLEX MICROSCOPIC  HEPARIN LEVEL (UNFRACTIONATED)     EKG None  Radiology CT ABDOMEN PELVIS W CONTRAST  Result Date: 03/10/2023 CLINICAL DATA:  Acute generalized abdominal pain. EXAM: CT ABDOMEN AND PELVIS WITH CONTRAST TECHNIQUE: Multidetector CT imaging of the abdomen and pelvis was performed using the standard protocol following bolus administration of intravenous contrast.  RADIATION DOSE REDUCTION: This exam was performed according to the departmental dose-optimization program which includes automated exposure control, adjustment of the mA and/or kV according to patient size and/or use of iterative reconstruction technique. CONTRAST:  OMNIPAQUE IOHEXOL 300 MG/ML  SOLN COMPARISON:  September 15, 2022. FINDINGS: Lower chest: No acute abnormality. Hepatobiliary: No cholelithiasis or biliary dilatation is noted. Multiple hepatic cysts are again noted. Pancreas: Unremarkable. No pancreatic ductal dilatation or surrounding inflammatory changes. Spleen: Normal in size without focal abnormality. Adrenals/Urinary Tract: Adrenal glands are unremarkable. Small nonobstructive right renal calculus is noted. No hydronephrosis. Urinary bladder is unremarkable. The patient is status post embolization of left renal angiomyolipoma according to prior exam. However, there is now interval development of large subcapsular hematoma and associated left perirenal hematoma superior to the left kidney measuring 7.3 x 5.3 cm. There does appear to be active extravasation of contrast suggesting active hemorrhage. Stomach/Bowel: Stomach is within normal limits. Appendix appears normal. No evidence of bowel wall thickening, distention, or inflammatory changes. Sigmoid diverticulosis without inflammation. Vascular/Lymphatic: Aortic atherosclerosis. No enlarged abdominal or pelvic lymph nodes. Reproductive: Uterus is unremarkable. Stable right adnexal mass is noted with calcifications, most consistent with fibroid as noted on prior studies. Other: No ascites or hernia is noted.  Musculoskeletal: Old T12, L2 and L4 fractures are noted. No acute osseous abnormality is noted. IMPRESSION: Patient is reportedly status post embolization of left renal angiomyolipoma according to prior exam. There is now interval development of large left renal subcapsular hematoma and associated left pararenal hematoma superior to the left kidney, active extravasation of contrast suggesting active hemorrhage. Critical Value/emergent results were called by telephone at the time of interpretation on 03/10/2023 at 4:08 pm to provider Dr. Charm Barges, who verbally acknowledged these results. Sigmoid diverticulosis without inflammation. Aortic Atherosclerosis (ICD10-I70.0). Electronically Signed   By: Lupita Raider M.D.   On: 03/10/2023 16:10    Procedures .Critical Care  Performed by: Jeanelle Malling, PA Authorized by: Jeanelle Malling, PA   Critical care provider statement:    Critical care time (minutes):  45   Critical care time was exclusive of:  Separately billable procedures and treating other patients   Critical care was necessary to treat or prevent imminent or life-threatening deterioration of the following conditions: active renal bleeding.   Critical care was time spent personally by me on the following activities:  Development of treatment plan with patient or surrogate, discussions with consultants, evaluation of patient's response to treatment, examination of patient, ordering and review of laboratory studies, ordering and review of radiographic studies, ordering and performing treatments and interventions, pulse oximetry, re-evaluation of patient's condition and review of old charts   Care discussed with: admitting provider and accepting provider at another facility       Medications Ordered in ED Medications  prothrombin complex conc human (KCENTRA) IVPB 3,805 Units (has no administration in time range)  prothrombin complex conc human (KCENTRA) injection (has no administration in time range)   ondansetron (ZOFRAN-ODT) disintegrating tablet 4 mg (4 mg Oral Given 03/10/23 1359)  morphine (PF) 4 MG/ML injection 4 mg (4 mg Intravenous Given 03/10/23 1500)  iohexol (OMNIPAQUE) 300 MG/ML solution 100 mL (100 mLs Intravenous Contrast Given 03/10/23 1530)  potassium chloride SA (KLOR-CON M) CR tablet 40 mEq (40 mEq Oral Given 03/10/23 1602)  HYDROmorphone (DILAUDID) injection 1 mg (1 mg Intravenous Given 03/10/23 1602)    ED Course/ Medical Decision Making/ A&P Clinical Course as of 03/10/23 1654  Tue Mar 10, 2023  2119 77 year old female here with acute onset of abdominal pain and nausea.  CT showing left renal hematoma with active extravasation.  She is on Eliquis.  Interventional radiology would like her transferred to [facility], [facility] for procedure. Needs reversal of anticoagulation. [MB]    Clinical Course User Index [MB] Terrilee Files, MD                             Medical Decision Making Amount and/or Complexity of Data Reviewed Labs: ordered. Radiology: ordered.  Risk Prescription drug management.   This patient presents to the ED for abdominal pain, this involves an extensive number of treatment options, and is a complaint that carries with a high risk of complications and morbidity.  The differential diagnosis includes aortic dissection, ACS/MI, enteritis, cholecystitis, GERD, pancreatitis, pneumonia, PUD.  This is not an exhaustive list.  Lab tests: I ordered and personally interpreted labs.  The pertinent results include: WBC 10.9. Hbg unremarkable. Platelets unremarkable. Electrolytes unremarkable. BUN, creatinine unremarkable.   Imaging studies: I ordered imaging studies, personally reviewed, interpreted imaging and agree with the radiologist's interpretations. The results include: CT abdomen pelvis showed a large left renal subcapsular hematoma and left pararenal hematoma superior to the left kidney, extravasation of contrast dye suggest active bleeding.  Problem list/  ED course/ Critical interventions/ Medical management: HPI: See above Vital signs within normal range and stable throughout visit. Laboratory/imaging studies significant for: See above. On physical examination, patient is afebrile and appears in no acute distress.  She does have tenderness to palpation to all 4 quadrants.  Labs with CBC showed white count of 10.9.  No evidence of anemia.  CMP with no acute electrolyte abnormalities.  CT abdomen pelvis showed a large left renal subcapsular hematoma and left pararenal hematoma superior to the left kidney, extravasation of contrast dye suggest active bleeding.  Given Percocet and Dilaudid for pain, Zofran for nausea.  Kcentra to reverse Eliquis.  Patient is hemodynamically stable at this point.  Last blood pressure was 136/73, respiration rate 20, O2 sat 98% on room air.  She continues to have pain in her abdomen. I have reviewed the patient home medicines and have made adjustments as needed.  Cardiac monitoring/EKG: The patient was maintained on a cardiac monitor.  I personally reviewed and interpreted the cardiac monitor which showed an underlying rhythm of: sinus rhythm.  Additional history obtained: External records from outside source obtained and reviewed including: Chart review including previous notes, labs, imaging.  Consultations obtained: I spoke to Dr. Joanne Gavel IR, discussed labs, imaging finding and pertinent plan. He recommended Kcentra for Eliquis. Dr. Miles Costain who took over the case notified on phone by the attending Dr. Charm Barges.  I spoke to Dr. Karene Fry EDP who agreed to admit the patient ED to ED.  Disposition Continued outpatient therapy. Follow-up with PCP recommended for reevaluation of symptoms. Treatment plan discussed with patient.  Pt acknowledged understanding was agreeable to the plan. Worrisome signs and symptoms were discussed with patient, and patient acknowledged understanding to return to the ED if they noticed these signs and  symptoms. Patient was stable upon discharge.   This chart was dictated using voice recognition software.  Despite best efforts to proofread,  errors can occur which can change the documentation meaning.          Final Clinical Impression(s) / ED Diagnoses Final diagnoses:  Hematoma of left kidney, initial encounter    Rx / DC Orders  ED Discharge Orders     None         Jeanelle Malling, [place] 03/10/23 1706    Jeanelle Malling, [place] 03/10/23 1730    Terrilee Files, MD 03/11/23 1058

## 2023-03-10 NOTE — ED Notes (Signed)
Patient transported to IR by IR staff

## 2023-03-10 NOTE — ED Notes (Signed)
Pt unable to urinate at this time. Aware that a specimen is needed.

## 2023-03-10 NOTE — Sedation Documentation (Signed)
Sheath removed. Celt deployed successfully

## 2023-03-11 DIAGNOSIS — K5790 Diverticulosis of intestine, part unspecified, without perforation or abscess without bleeding: Secondary | ICD-10-CM | POA: Diagnosis not present

## 2023-03-11 DIAGNOSIS — E871 Hypo-osmolality and hyponatremia: Secondary | ICD-10-CM | POA: Insufficient documentation

## 2023-03-11 DIAGNOSIS — I480 Paroxysmal atrial fibrillation: Secondary | ICD-10-CM | POA: Diagnosis not present

## 2023-03-11 DIAGNOSIS — S37012A Minor contusion of left kidney, initial encounter: Secondary | ICD-10-CM | POA: Diagnosis not present

## 2023-03-11 DIAGNOSIS — T148XXA Other injury of unspecified body region, initial encounter: Secondary | ICD-10-CM | POA: Diagnosis present

## 2023-03-11 DIAGNOSIS — E876 Hypokalemia: Secondary | ICD-10-CM | POA: Insufficient documentation

## 2023-03-11 DIAGNOSIS — D1771 Benign lipomatous neoplasm of kidney: Secondary | ICD-10-CM | POA: Diagnosis not present

## 2023-03-11 LAB — COMPREHENSIVE METABOLIC PANEL
ALT: 14 U/L (ref 0–44)
ALT: 60 U/L — ABNORMAL HIGH (ref 0–44)
AST: 22 U/L (ref 15–41)
AST: 82 U/L — ABNORMAL HIGH (ref 15–41)
Albumin: 3.6 g/dL (ref 3.5–5.0)
Albumin: 3.5 g/dL (ref 3.5–5.0)
Alkaline Phosphatase: 45 U/L (ref 38–126)
Alkaline Phosphatase: 41 U/L (ref 38–126)
Anion gap: 13 (ref 5–15)
Anion gap: 10 (ref 5–15)
BUN: 34 mg/dL — ABNORMAL HIGH (ref 8–23)
BUN: 34 mg/dL — ABNORMAL HIGH (ref 8–23)
CO2: 16 mmol/L — ABNORMAL LOW (ref 22–32)
CO2: 22 mmol/L (ref 22–32)
Calcium: 8.6 mg/dL — ABNORMAL LOW (ref 8.9–10.3)
Calcium: 8.4 mg/dL — ABNORMAL LOW (ref 8.9–10.3)
Chloride: 106 mmol/L (ref 98–111)
Chloride: 103 mmol/L (ref 98–111)
Creatinine, Ser: 0.97 mg/dL (ref 0.44–1.00)
Creatinine, Ser: 1.28 mg/dL — ABNORMAL HIGH (ref 0.44–1.00)
GFR, Estimated: >60 mL/min (ref >60)
GFR, Estimated: 43 mL/min — ABNORMAL LOW (ref >60)
Glucose, Bld: 158 mg/dL — ABNORMAL HIGH (ref 70–99)
Glucose, Bld: 157 mg/dL — ABNORMAL HIGH (ref 70–99)
Potassium: 5.3 mmol/L — ABNORMAL HIGH (ref 3.5–5.1)
Potassium: 3.9 mmol/L (ref 3.5–5.1)
Sodium: 135 mmol/L (ref 135–145)
Sodium: 135 mmol/L (ref 135–145)
Total Bilirubin: 0.9 mg/dL (ref 0.3–1.2)
Total Bilirubin: 0.5 mg/dL (ref 0.3–1.2)
Total Protein: 6 g/dL — ABNORMAL LOW (ref 6.5–8.1)
Total Protein: 5.9 g/dL — ABNORMAL LOW (ref 6.5–8.1)

## 2023-03-11 LAB — CBC WITH DIFFERENTIAL/PLATELET
Abs Immature Granulocytes: 0.08 10*3/uL — ABNORMAL HIGH (ref 0.00–0.07)
Abs Immature Granulocytes: 0.06 10*3/uL (ref 0.00–0.07)
Basophils Absolute: 0 10*3/uL (ref 0.0–0.1)
Basophils Absolute: 0 10*3/uL (ref 0.0–0.1)
Basophils Relative: 0 %
Basophils Relative: 0 %
Eosinophils Absolute: 0 10*3/uL (ref 0.0–0.5)
Eosinophils Absolute: 0 10*3/uL (ref 0.0–0.5)
Eosinophils Relative: 0 %
Eosinophils Relative: 0 %
HCT: 29.8 % — ABNORMAL LOW (ref 36.0–46.0)
HCT: 28.3 % — ABNORMAL LOW (ref 36.0–46.0)
Hemoglobin: 10.3 g/dL — ABNORMAL LOW (ref 12.0–15.0)
Hemoglobin: 9.6 g/dL — ABNORMAL LOW (ref 12.0–15.0)
Immature Granulocytes: 1 %
Immature Granulocytes: 1 %
Lymphocytes Relative: 8 %
Lymphocytes Relative: 7 %
Lymphs Abs: 1 10*3/uL (ref 0.7–4.0)
Lymphs Abs: 1 10*3/uL (ref 0.7–4.0)
MCH: 33.4 pg (ref 26.0–34.0)
MCH: 32.7 pg (ref 26.0–34.0)
MCHC: 34.6 g/dL (ref 30.0–36.0)
MCHC: 33.9 g/dL (ref 30.0–36.0)
MCV: 96.8 fL (ref 80.0–100.0)
MCV: 96.3 fL (ref 80.0–100.0)
Monocytes Absolute: 1.6 10*3/uL — ABNORMAL HIGH (ref 0.1–1.0)
Monocytes Absolute: 1.5 10*3/uL — ABNORMAL HIGH (ref 0.1–1.0)
Monocytes Relative: 11 %
Monocytes Relative: 11 %
Neutro Abs: 11.2 10*3/uL — ABNORMAL HIGH (ref 1.7–7.7)
Neutro Abs: 10.7 10*3/uL — ABNORMAL HIGH (ref 1.7–7.7)
Neutrophils Relative %: 80 %
Neutrophils Relative %: 81 %
Platelets: 154 10*3/uL (ref 150–400)
Platelets: 142 10*3/uL — ABNORMAL LOW (ref 150–400)
RBC: 3.08 MIL/uL — ABNORMAL LOW (ref 3.87–5.11)
RBC: 2.94 MIL/uL — ABNORMAL LOW (ref 3.87–5.11)
RDW: 12.6 % (ref 11.5–15.5)
RDW: 12.7 % (ref 11.5–15.5)
WBC: 13.9 10*3/uL — ABNORMAL HIGH (ref 4.0–10.5)
WBC: 13.2 10*3/uL — ABNORMAL HIGH (ref 4.0–10.5)
nRBC: 0 % (ref 0.0–0.2)
nRBC: 0 % (ref 0.0–0.2)

## 2023-03-11 LAB — BPAM RBC
Blood Product Expiration Date: 202408132359
Blood Product Expiration Date: 202408222359
Blood Product Expiration Date: 202408252359
ISSUE DATE / TIME: 202407232148
ISSUE DATE / TIME: 202407240143
Unit Type and Rh: 1700
Unit Type and Rh: 5100
Unit Type and Rh: 5100

## 2023-03-11 LAB — APTT: aPTT: 26 seconds (ref 24–36)

## 2023-03-11 LAB — TYPE AND SCREEN
Unit division: 0
Unit division: 0

## 2023-03-11 LAB — PHOSPHORUS: Phosphorus: 3.9 mg/dL (ref 2.5–4.6)

## 2023-03-11 LAB — MAGNESIUM: Magnesium: 2.1 mg/dL (ref 1.7–2.4)

## 2023-03-11 LAB — PROTIME-INR
INR: 1.2 (ref 0.8–1.2)
Prothrombin Time: 15.1 seconds (ref 11.4–15.2)

## 2023-03-11 LAB — HEMOGLOBIN AND HEMATOCRIT, BLOOD
HCT: 37.9 % (ref 36.0–46.0)
HCT: 26.7 % — ABNORMAL LOW (ref 36.0–46.0)
Hemoglobin: 12.1 g/dL (ref 12.0–15.0)
Hemoglobin: 9.1 g/dL — ABNORMAL LOW (ref 12.0–15.0)

## 2023-03-11 MED ORDER — SODIUM ZIRCONIUM CYCLOSILICATE 10 G PO PACK
10.0000 g | PACK | Freq: Once | ORAL | Status: AC
  Administered 2023-03-11: 10 g via ORAL
  Filled 2023-03-11: qty 1

## 2023-03-11 NOTE — ED Notes (Signed)
ED TO INPATIENT HANDOFF REPORT  ED Nurse Name and Phone #: Grover Canavan [phone]  S Name/Age/Gender Ann Vang 77 y.o. female Room/Bed: 006C/006C  Code Status   Code Status: Full Code  Home/SNF/Other Home Patient oriented to: self, place, time, and situation Is this baseline? Yes   Triage Complete: Triage complete  Chief Complaint Renal hematoma, left [S37.012A] Hematoma [T14.8XXA]  Triage Note Pt endorses generalized sharp abd pain and nausea starting today. Last BM this morning that was smaller than normal. Pt did eat hard boiled egg that may have been peeled for a few days.    Allergies No Known Allergies  Level of Care/Admitting Diagnosis ED Disposition     ED Disposition  Admit   Condition  --   Comment  Hospital Area: [facility] [[id number]]  Level of Care: Telemetry Medical [104]  May admit patient to [facility] or [facility] if equivalent level of care is available:: Yes  Covid Evaluation: Asymptomatic - no recent exposure (last 10 days) testing not required  Diagnosis: Hematoma [244362]  Admitting Physician: Marguerita Merles LATIF [[id number]]  Attending Physician: Marguerita Merles LATIF [phone]  Certification:: I certify this patient will need inpatient services for at least 2 midnights          B Medical/Surgery History Past Medical History:  Diagnosis Date   Abnormal breast finding 12/1998   left breast abnl   Arthritis    --basal joint--right hand   Carpal tunnel syndrome, right    Compression fracture of T2 vertebra (HCC) 05/10/2019   Diverticulosis 2003   mild   Lumbar compression fracture (HCC) 07/13/2012   Lumbar disc disorder 2000   bulging   Melena    Menopause    Osteoporosis    Fx L4 age 79 water skiing accident   Palpitations    Thyroid disease 10/2014   thyroid nodules--bx'd large nodule and was benign--sees Gen. surgeon with [facility]   Past Surgical History:  Procedure Laterality Date   AUGMENTATION  MAMMAPLASTY  1983   Implants have been removed   BREAST SURGERY  2010   implants removed ruptured   CERVICAL BIOPSY  W/ LOOP ELECTRODE EXCISION  1996   CIN   IR ANGIOGRAM SELECTIVE EACH ADDITIONAL VESSEL  03/10/2023   IR ANGIOGRAM SELECTIVE EACH ADDITIONAL VESSEL  03/10/2023   IR ANGIOGRAM SELECTIVE EACH ADDITIONAL VESSEL  03/10/2023   IR EMBO ART  VEN HEMORR LYMPH EXTRAV  INC GUIDE ROADMAPPING  03/10/2023   IR EMBO TUMOR ORGAN ISCHEMIA INFARCT INC GUIDE ROADMAPPING  05/13/2017   IR RADIOLOGIST EVAL & MGMT  04/15/2017   IR RADIOLOGIST EVAL & MGMT  05/27/2017   IR RADIOLOGIST EVAL & MGMT  01/21/2018   IR RADIOLOGIST EVAL & MGMT  09/28/2019   IR RADIOLOGIST EVAL & MGMT  10/07/2021   IR RENAL SELECTIVE  UNI INC S&I MOD SED  03/10/2023   IR RENAL SUPRASEL UNI S&I MOD SED  05/13/2017   IR US GUIDE VASC ACCESS LEFT  05/13/2017   IR US GUIDE VASC ACCESS RIGHT  03/10/2023   KNEE SURGERY  2012   meniscus rt knee (both)   L-2 fracture  2009   fall in aerobics   L-4 fracture   water skiing     TENOSYNOVECTOMY Right 03/11/2016   Procedure: TENOSYNOVECTOMY FLEXOR RIGHT RING FINGER;  Surgeon: Cindee Salt, MD;  Location: [facility];  Service: Orthopedics;  Laterality: Right;   thyroid nodule biopsy  10/2014   --[facility], [place]--   TONSILLECTOMY AND ADENOIDECTOMY  1964     A IV Location/Drains/Wounds Patient Lines/Drains/Airways Status     Active Line/Drains/Airways     Name Placement date Placement time Site Days   Peripheral IV 03/10/23 20 G Anterior;Right;Distal Forearm 03/10/23  1610  Forearm  1   Peripheral IV 03/10/23 20 G Anterior;Left Forearm 03/10/23  1958  Forearm  1            Intake/Output Last 24 hours No intake or output data in the 24 hours ending 03/11/23 1535  Labs/Imaging Results for orders placed or performed during the hospital encounter of 03/10/23 (from the past 48 hour(s))  Lipase, blood     Status: None   Collection Time: 03/10/23  1:59 PM  Result  Value Ref Range   Lipase 26 11 - 51 U/L    Comment: Performed at [facility], [address] [place], [place] [address]  Comprehensive metabolic panel     Status: Abnormal   Collection Time: 03/10/23  1:59 PM  Result Value Ref Range   Sodium 134 (L) 135 - 145 mmol/L   Potassium 3.4 (L) 3.5 - 5.1 mmol/L   Chloride 100 98 - 111 mmol/L   CO2 21 (L) 22 - 32 mmol/L   Glucose, Bld 145 (H) 70 - 99 mg/dL    Comment: Glucose reference range applies only to samples taken after fasting for at least 8 hours.   BUN 27 (H) 8 - 23 mg/dL   Creatinine, Ser 2.13 0.44 - 1.00 mg/dL   Calcium 9.2 8.9 - 08.6 mg/dL   Total Protein 7.2 6.5 - 8.1 g/dL   Albumin 4.4 3.5 - 5.0 g/dL   AST 22 15 - 41 U/L   ALT 17 0 - 44 U/L   Alkaline Phosphatase 49 38 - 126 U/L   Total Bilirubin 1.1 0.3 - 1.2 mg/dL   GFR, Estimated >57 >84 mL/min    Comment: (NOTE) Calculated using the CKD-EPI Creatinine Equation (2021)    Anion gap 13 5 - 15    Comment: Performed at [facility], [address] [place], [place] [address]  CBC     Status: Abnormal   Collection Time: 03/10/23  1:59 PM  Result Value Ref Range   WBC 10.9 (H) 4.0 - 10.5 K/uL   RBC 4.39 3.87 - 5.11 MIL/uL   Hemoglobin 14.0 12.0 - 15.0 g/dL   HCT 52.8 41.3 - 24.4 %   MCV 94.5 80.0 - 100.0 fL   MCH 31.9 26.0 - 34.0 pg   MCHC 33.7 30.0 - 36.0 g/dL   RDW 01.0 27.2 - 53.6 %   Platelets 185 150 - 400 K/uL   nRBC 0.0 0.0 - 0.2 %    Comment: Performed at [facility], [address] [place], [place] [address]  APTT     Status: None   Collection Time: 03/10/23  4:27 PM  Result Value Ref Range   aPTT 27 24 - 36 seconds    Comment: Performed at [facility], [address] [place], [place] [address]  Protime-INR     Status: Abnormal   Collection Time: 03/10/23  4:27 PM  Result Value Ref Range   Prothrombin Time 15.9 (H) 11.4 - 15.2 seconds   INR 1.2 0.8 - 1.2    Comment: (NOTE) INR goal varies based on device  and disease states. Performed at [facility], [address] [place], [place] [address]   Hemoglobin and hematocrit, blood     Status: None   Collection Time: 03/10/23  8:29 PM  Result Value Ref Range   Hemoglobin 12.4 12.0 - 15.0 g/dL   HCT 13.0 86.5 - 78.4 %    Comment: Performed at [facility], [address], [place], [place] [address]  Type and screen [facility]     Status: None (Preliminary result)   Collection Time: 03/10/23  9:58 PM  Result Value Ref Range   ABO/RH(D) B NEG    Antibody Screen NEG    Sample Expiration      03/13/2023,2359 Performed at [facility], [address] [place], [place] [address]    Unit Number [id number]    Blood Component Type RED CELLS,LR    Unit division 00    Status of Unit REL FROM [facility]    Unit tag comment EMERGENCY RELEASE    Transfusion Status OK TO TRANSFUSE    Crossmatch Result COMPATIBLE    Unit Number [id number]    Blood Component Type RED CELLS,LR    Unit division 00    Status of Unit REL FROM [facility]    Unit tag comment EMERGENCY RELEASE    Transfusion Status OK TO TRANSFUSE    Crossmatch Result COMPATIBLE    Unit Number [id number]    Blood Component Type RED CELLS,LR    Unit division 00    Status of Unit ALLOCATED    Transfusion Status OK TO TRANSFUSE    Crossmatch Result Compatible    Unit Number [id number]    Blood Component Type RED CELLS,LR    Unit division 00    Status of Unit ALLOCATED    Transfusion Status OK TO TRANSFUSE    Crossmatch Result Compatible   Comprehensive metabolic panel     Status: Abnormal   Collection Time: 03/10/23 10:00 PM  Result Value Ref Range   Sodium 136 135 - 145 mmol/L   Potassium 5.2 (H) 3.5 - 5.1 mmol/L   Chloride 106 98 - 111 mmol/L   CO2 18 (L) 22 - 32 mmol/L   Glucose, Bld 197 (H) 70 - 99 mg/dL    Comment: Glucose reference range applies only to samples taken after fasting for at least 8 hours.   BUN 30 (H) 8 - 23 mg/dL    Creatinine, Ser 0.93 (H) 0.44 - 1.00 mg/dL   Calcium 8.7 (L) 8.9 - 10.3 mg/dL   Total Protein 6.1 (L) 6.5 - 8.1 g/dL   Albumin 3.8 3.5 - 5.0 g/dL   AST 21 15 - 41 U/L   ALT 14 0 - 44 U/L   Alkaline Phosphatase 43 38 - 126 U/L   Total Bilirubin 0.8 0.3 - 1.2 mg/dL   GFR, Estimated 48 (L) >60 mL/min    Comment: (NOTE) Calculated using the CKD-EPI Creatinine Equation (2021)    Anion gap 12 5 - 15    Comment: Performed at [facility], [address] [place], [place] [address]  Hemoglobin and hematocrit, blood     Status: None   Collection Time: 03/10/23 10:02 PM  Result Value Ref Range   Hemoglobin 12.6 12.0 - 15.0 g/dL   HCT 32.2 02.5 - 42.7 %    Comment: Performed at [facility], [address] [place], [place] [address]  Prepare RBC (crossmatch)     Status: None   Collection Time: 03/10/23 10:10 PM  Result  Value Ref Range   Order Confirmation      ORDER PROCESSED BY BLOOD BANK Performed at [facility], [address], [place], [place] [address]   Hemoglobin and hematocrit, blood     Status: None   Collection Time: 03/11/23  4:00 AM  Result Value Ref Range   Hemoglobin 12.1 12.0 - 15.0 g/dL   HCT 32.4 40.1 - 02.7 %    Comment: Performed at [facility], [address], [place], [place] [address]  Comprehensive metabolic panel     Status: Abnormal   Collection Time: 03/11/23  4:00 AM  Result Value Ref Range   Sodium 135 135 - 145 mmol/L   Potassium 5.3 (H) 3.5 - 5.1 mmol/L   Chloride 106 98 - 111 mmol/L   CO2 16 (L) 22 - 32 mmol/L   Glucose, Bld 158 (H) 70 - 99 mg/dL    Comment: Glucose reference range applies only to samples taken after fasting for at least 8 hours.   BUN 34 (H) 8 - 23 mg/dL   Creatinine, Ser 4.40 0.44 - 1.00 mg/dL   Calcium 8.6 (L) 8.9 - 10.3 mg/dL   Total Protein 6.0 (L) 6.5 - 8.1 g/dL   Albumin 3.6 3.5 - 5.0 g/dL   AST 22 15 - 41 U/L   ALT 14 0 - 44 U/L   Alkaline Phosphatase 45 38 - 126 U/L   Total Bilirubin 0.9 0.3 -  1.2 mg/dL   GFR, Estimated >34 >74 mL/min    Comment: (NOTE) Calculated using the CKD-EPI Creatinine Equation (2021)    Anion gap 13 5 - 15    Comment: Performed at [facility], [address] [place], [place] [address]  APTT     Status: None   Collection Time: 03/11/23  4:00 AM  Result Value Ref Range   aPTT 26 24 - 36 seconds    Comment: Performed at [facility], [address] [place], [place] [address]  Protime-INR     Status: None   Collection Time: 03/11/23  4:00 AM  Result Value Ref Range   Prothrombin Time 15.1 11.4 - 15.2 seconds   INR 1.2 0.8 - 1.2    Comment: (NOTE) INR goal varies based on device and disease states. Performed at [facility], [address] [place], [place] [address]    IR US Guide Vasc Access Right  Result Date: 03/11/2023 INDICATION: Acute arterial bleeding from a known large left renal angiomyolipoma. Remote embolization 2018. EXAM: ULTRASOUND GUIDANCE FOR VASCULAR ACCESS LEFT RENAL ANGIOGRAM PERIPHERAL MICRO CATHETERIZATIONS AND ANGIOGRAMS OF 3 ADDITIONAL SEGMENTAL BRANCHES TO THE LEFT KIDNEY UPPER POLE PERIPHERAL EMBOLIZATION OF AN ABNORMAL LEFT INTERPOLE SEGMENTAL BRANCH SUPPLYING THE RENAL ANGIOMYOLIPOMA MEDICATIONS: 1% LIDOCAINE LOCAL. ANESTHESIA/SEDATION: Moderate (conscious) sedation was employed during this procedure. A total of Versed 2.0 mg and Fentanyl 100 mcg was administered intravenously by the radiology nurse. Total intra-service moderate Sedation Time: 56 minutes. The patient's level of consciousness and vital signs were monitored continuously by radiology nursing throughout the procedure under my direct supervision. CONTRAST:  76 cc omni 300 FLUOROSCOPY: Radiation Exposure Index (as provided by the fluoroscopic device): 1,414 MGy Kerma COMPLICATIONS: None immediate. PROCEDURE: Informed consent was obtained from the patient following explanation of the procedure, risks, benefits and alternatives. The patient understands,  agrees and consents for the procedure. All questions were addressed. A time out was performed prior to the initiation of the procedure. Maximal barrier sterile  technique utilized including caps, mask, sterile gowns, sterile gloves, large sterile drape, hand hygiene, and Betadine prep. Under sterile conditions and local anesthesia, ultrasound micropuncture access performed of the right common femoral artery. Images obtained for documentation of the patent right common femoral artery. Five French sheath inserted over a Bentson guidewire. Initially, a C2 catheter was utilized to select the left renal artery. Initial left renal angiogram performed. Left renal artery origin is widely patent. Minor nonocclusive atherosclerotic change of the main renal artery. Peripheral branches are patent. Previous coil embolization noted within the mid to upper pole region. Initial angiogram demonstrates trace abnormal vascularity along the left kidney lateral cortical margin suspicious for an area of bleeding versus tumor vascularity. Catheter exchangeD for a chung 2.5 catheter. This was reformed in the thoracic aorta and retracted to select the left renal artery. Additional left renal angiograms performed in oblique projections. Again, from the mid pole laterally there is abnormal vascularity outside of the renal parenchymal phase concerning for an area of arterial bleeding when correlated with CTA. Through the chung 2.5 catheter, the Renegade STC catheter was advanced over a fathom 14 guidewire initially into an upper pole segmental branch. Angiogram within this branch demonstrates normal parenchymal staining without tumor vascularity. Microcatheter retracted and utilized to select an additional segmental branch to the upper pole. Angiogram within this second branch demonstrates normal renal vascularity as well. Microcatheter retracted and advanced over a GT Glidewire into mid pole segmental branches. Additional angiogram  demonstrates normal renal vascularity. Faint tumor vascularity still visualized laterally. Over the fathom 14 guidewire, eventually the microcatheter was advanced into an interpolar branch close to the existing microcoils. Angiogram within this segmental branch demonstrates enlarged tortuous tumor vascularity with contrast extravasation into the surrounding retroperitoneal space. This correlates with the CTA. From this location, embolization was performed. Embolization: Through the Renegade STC catheter, 3 cc of 300-500 micron embospheres were slowly instilled under intermittent fluoroscopy watching for reflux. Once reflux was visualized, a single 3mm x 4 cm Azur CX detachable microcoil was successfully deployed within the interpolar abnormal segmental branch. After approximately 5 minutes, repeat angiogram performed through the microcatheter within the branch. Following embolization this branch is occluded and the abnormal vascularity to the angiomyolipoma is no longer visualized. Microcatheter removed. Final renal angiogram performed through the 5 French Chung catheter. This confirms preservation of the main renal artery supply to the left kidney. No additional abnormal vascularity visualized. Injection of the right common femoral artery sheath confirms adequate access for closure device. Of note, there is beaded appearance of the right external iliac artery compatible with nonocclusive fibromuscular dysplasia. Right common femoral, proximal profunda femoral, proximal superficial femoral arteries are all patent. Successful deployment of the CELT device for hemostasis. 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 Electronically Signed   By: Judie Petit.  Shick M.D.   On: 03/11/2023 08:19   IR Angiogram Renal Left Selective  Result  Date: 03/11/2023 INDICATION: Acute arterial bleeding from a known large left renal angiomyolipoma. Remote embolization 2018. EXAM: ULTRASOUND GUIDANCE FOR VASCULAR ACCESS LEFT RENAL ANGIOGRAM PERIPHERAL MICRO CATHETERIZATIONS AND ANGIOGRAMS OF 3 ADDITIONAL SEGMENTAL BRANCHES TO THE LEFT KIDNEY UPPER POLE PERIPHERAL EMBOLIZATION OF AN ABNORMAL LEFT INTERPOLE SEGMENTAL BRANCH SUPPLYING THE RENAL ANGIOMYOLIPOMA MEDICATIONS: 1% LIDOCAINE LOCAL. ANESTHESIA/SEDATION: Moderate (conscious) sedation was employed during this procedure. A total of Versed 2.0 mg and Fentanyl 100 mcg  was administered intravenously by the radiology nurse. Total intra-service moderate Sedation Time: 56 minutes. The patient's level of consciousness and vital signs were monitored continuously by radiology nursing throughout the procedure under my direct supervision. CONTRAST:  76 cc omni 300 FLUOROSCOPY: Radiation Exposure Index (as provided by the fluoroscopic device): 1,414 MGy Kerma COMPLICATIONS: None immediate. PROCEDURE: Informed consent was obtained from the patient following explanation of the procedure, risks, benefits and alternatives. The patient understands, agrees and consents for the procedure. All questions were addressed. A time out was performed prior to the initiation of the procedure. Maximal barrier sterile technique utilized including caps, mask, sterile gowns, sterile gloves, large sterile drape, hand hygiene, and Betadine prep. Under sterile conditions and local anesthesia, ultrasound micropuncture access performed of the right common femoral artery. Images obtained for documentation of the patent right common femoral artery. Five French sheath inserted over a Bentson guidewire. Initially, a C2 catheter was utilized to select the left renal artery. Initial left renal angiogram performed. Left renal artery origin is widely patent. Minor nonocclusive atherosclerotic change of the main renal artery. Peripheral branches are  patent. Previous coil embolization noted within the mid to upper pole region. Initial angiogram demonstrates trace abnormal vascularity along the left kidney lateral cortical margin suspicious for an area of bleeding versus tumor vascularity. Catheter exchangeD for a chung 2.5 catheter. This was reformed in the thoracic aorta and retracted to select the left renal artery. Additional left renal angiograms performed in oblique projections. Again, from the mid pole laterally there is abnormal vascularity outside of the renal parenchymal phase concerning for an area of arterial bleeding when correlated with CTA. Through the chung 2.5 catheter, the Renegade STC catheter was advanced over a fathom 14 guidewire initially into an upper pole segmental branch. Angiogram within this branch demonstrates normal parenchymal staining without tumor vascularity. Microcatheter retracted and utilized to select an additional segmental branch to the upper pole. Angiogram within this second branch demonstrates normal renal vascularity as well. Microcatheter retracted and advanced over a GT Glidewire into mid pole segmental branches. Additional angiogram demonstrates normal renal vascularity. Faint tumor vascularity still visualized laterally. Over the fathom 14 guidewire, eventually the microcatheter was advanced into an interpolar branch close to the existing microcoils. Angiogram within this segmental branch demonstrates enlarged tortuous tumor vascularity with contrast extravasation into the surrounding retroperitoneal space. This correlates with the CTA. From this location, embolization was performed. Embolization: Through the Renegade STC catheter, 3 cc of 300-500 micron embospheres were slowly instilled under intermittent fluoroscopy watching for reflux. Once reflux was visualized, a single 3mm x 4 cm Azur CX detachable microcoil was successfully deployed within the interpolar abnormal segmental branch. After approximately 5  minutes, repeat angiogram performed through the microcatheter within the branch. Following embolization this branch is occluded and the abnormal vascularity to the angiomyolipoma is no longer visualized. Microcatheter removed. Final renal angiogram performed through the 5 French Chung catheter. This confirms preservation of the main renal artery supply to the left kidney. No additional abnormal vascularity visualized. Injection of the right common femoral artery sheath confirms adequate access for closure device. Of note, there is beaded appearance of the right external iliac artery compatible with nonocclusive fibromuscular dysplasia. Right common femoral, proximal profunda femoral, proximal superficial femoral arteries are all patent. Successful deployment of the CELT device for hemostasis. IMPRESSION: Successful left renal angiography with micro catheterization and coil embolization of a left renal interpolar segmental branch supplying tumor vascularity to the known renal angiomyolipoma with evidence of  active bleeding. Electronically Signed   By: Judie Petit.  Shick M.D.   On: 03/11/2023 08:19   IR EMBO ART  VEN HEMORR LYMPH EXTRAV  INC GUIDE ROADMAPPING  Result Date: 03/11/2023 INDICATION: Acute arterial bleeding from a known large left renal angiomyolipoma. Remote embolization 2018. EXAM: ULTRASOUND GUIDANCE FOR VASCULAR ACCESS LEFT RENAL ANGIOGRAM PERIPHERAL MICRO CATHETERIZATIONS AND ANGIOGRAMS OF 3 ADDITIONAL SEGMENTAL BRANCHES TO THE LEFT KIDNEY UPPER POLE PERIPHERAL EMBOLIZATION OF AN ABNORMAL LEFT INTERPOLE SEGMENTAL BRANCH SUPPLYING THE RENAL ANGIOMYOLIPOMA MEDICATIONS: 1% LIDOCAINE LOCAL. ANESTHESIA/SEDATION: Moderate (conscious) sedation was employed during this procedure. A total of Versed 2.0 mg and Fentanyl 100 mcg was administered intravenously by the radiology nurse. Total intra-service moderate Sedation Time: 56 minutes. The patient's level of consciousness and vital signs were monitored continuously  by radiology nursing throughout the procedure under my direct supervision. CONTRAST:  76 cc omni 300 FLUOROSCOPY: Radiation Exposure Index (as provided by the fluoroscopic device): 1,414 MGy Kerma COMPLICATIONS: None immediate. PROCEDURE: Informed consent was obtained from the patient following explanation of the procedure, risks, benefits and alternatives. The patient understands, agrees and consents for the procedure. All questions were addressed. A time out was performed prior to the initiation of the procedure. Maximal barrier sterile technique utilized including caps, mask, sterile gowns, sterile gloves, large sterile drape, hand hygiene, and Betadine prep. Under sterile conditions and local anesthesia, ultrasound micropuncture access performed of the right common femoral artery. Images obtained for documentation of the patent right common femoral artery. Five French sheath inserted over a Bentson guidewire. Initially, a C2 catheter was utilized to select the left renal artery. Initial left renal angiogram performed. Left renal artery origin is widely patent. Minor nonocclusive atherosclerotic change of the main renal artery. Peripheral branches are patent. Previous coil embolization noted within the mid to upper pole region. Initial angiogram demonstrates trace abnormal vascularity along the left kidney lateral cortical margin suspicious for an area of bleeding versus tumor vascularity. Catheter exchangeD for a chung 2.5 catheter. This was reformed in the thoracic aorta and retracted to select the left renal artery. Additional left renal angiograms performed in oblique projections. Again, from the mid pole laterally there is abnormal vascularity outside of the renal parenchymal phase concerning for an area of arterial bleeding when correlated with CTA. Through the chung 2.5 catheter, the Renegade STC catheter was advanced over a fathom 14 guidewire initially into an upper pole segmental branch. Angiogram  within this branch demonstrates normal parenchymal staining without tumor vascularity. Microcatheter retracted and utilized to select an additional segmental branch to the upper pole. Angiogram within this second branch demonstrates normal renal vascularity as well. Microcatheter retracted and advanced over a GT Glidewire into mid pole segmental branches. Additional angiogram demonstrates normal renal vascularity. Faint tumor vascularity still visualized laterally. Over the fathom 14 guidewire, eventually the microcatheter was advanced into an interpolar branch close to the existing microcoils. Angiogram within this segmental branch demonstrates enlarged tortuous tumor vascularity with contrast extravasation into the surrounding retroperitoneal space. This correlates with the CTA. From this location, embolization was performed. Embolization: Through the Renegade STC catheter, 3 cc of 300-500 micron embospheres were slowly instilled under intermittent fluoroscopy watching for reflux. Once reflux was visualized, a single 3mm x 4 cm Azur CX detachable microcoil was successfully deployed within the interpolar abnormal segmental branch. After approximately 5 minutes, repeat angiogram performed through the microcatheter within the branch. Following embolization this branch is occluded and the abnormal vascularity to the angiomyolipoma is no longer visualized. Microcatheter removed.  Final renal angiogram performed through the 5 French Chung catheter. This confirms preservation of the main renal artery supply to the left kidney. No additional abnormal vascularity visualized. Injection of the right common femoral artery sheath confirms adequate access for closure device. Of note, there is beaded appearance of the right external iliac artery compatible with nonocclusive fibromuscular dysplasia. Right common femoral, proximal profunda femoral, proximal superficial femoral arteries are all patent. Successful deployment of the  CELT device for hemostasis. 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 Electronically Signed   By: Judie Petit.  Shick M.D.   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 Remote embolization 2018. EXAM: ULTRASOUND GUIDANCE FOR VASCULAR ACCESS LEFT RENAL ANGIOGRAM PERIPHERAL MICRO CATHETERIZATIONS AND ANGIOGRAMS OF 3 ADDITIONAL SEGMENTAL BRANCHES TO THE LEFT KIDNEY UPPER POLE PERIPHERAL EMBOLIZATION OF AN ABNORMAL LEFT INTERPOLE SEGMENTAL BRANCH SUPPLYING THE RENAL ANGIOMYOLIPOMA MEDICATIONS: 1% LIDOCAINE LOCAL. ANESTHESIA/SEDATION: Moderate (conscious) sedation was employed during this procedure. A total of Versed 2.0 mg and Fentanyl 100 mcg was administered intravenously by the radiology nurse. Total intra-service moderate Sedation Time: 56 minutes. The patient's level of consciousness and vital signs were monitored continuously by radiology nursing throughout the procedure under my direct supervision. CONTRAST:  76 cc omni 300 FLUOROSCOPY: Radiation Exposure Index (as provided by the fluoroscopic device): 1,414 MGy Kerma COMPLICATIONS: None immediate. PROCEDURE: Informed consent was obtained from the patient following explanation of the procedure, risks, benefits and alternatives. The patient understands, agrees and consents for the procedure. All questions were addressed. A time out was performed prior to the initiation of the procedure. Maximal barrier sterile technique utilized including caps, mask, sterile gowns, sterile gloves, large sterile drape, hand hygiene, and Betadine prep. Under sterile conditions and local anesthesia, ultrasound micropuncture access performed of the right common femoral  artery. Images obtained for documentation of the patent right common femoral artery. Five French sheath inserted over a Bentson guidewire. Initially, a C2 catheter was utilized to select the left renal artery. Initial left renal angiogram performed. Left renal artery origin is widely patent. Minor nonocclusive atherosclerotic change of the main renal artery. Peripheral branches are patent. Previous coil embolization noted within the mid to upper pole region. Initial angiogram demonstrates trace abnormal vascularity along the left kidney lateral cortical margin suspicious for an area of bleeding versus tumor vascularity. Catheter exchangeD for a chung 2.5 catheter. This was reformed in the thoracic aorta and retracted to select the left renal artery. Additional left renal angiograms performed in oblique projections. Again, from the mid pole laterally there is abnormal vascularity outside of the renal parenchymal phase concerning for an area of arterial bleeding when correlated with CTA. Through the chung 2.5 catheter, the Renegade STC catheter was advanced over a fathom 14 guidewire initially into an upper pole segmental branch. Angiogram within this branch demonstrates normal parenchymal staining without tumor vascularity. Microcatheter retracted and utilized to select an additional segmental branch to the upper pole. Angiogram within this second branch demonstrates normal renal vascularity as well. Microcatheter retracted and advanced over a GT Glidewire into mid pole segmental branches. Additional angiogram demonstrates normal renal vascularity. Faint tumor vascularity still visualized laterally. Over the fathom 14 guidewire, eventually the microcatheter was advanced into an interpolar branch close to the existing microcoils. Angiogram within  this segmental branch demonstrates enlarged tortuous tumor vascularity with contrast extravasation into the surrounding retroperitoneal space. This correlates with the CTA.  From this location, embolization was performed. Embolization: Through the Renegade STC catheter, 3 cc of 300-500 micron embospheres were slowly instilled under intermittent fluoroscopy watching for reflux. Once reflux was visualized, a single 3mm x 4 cm Azur CX detachable microcoil was successfully deployed within the interpolar abnormal segmental branch. After approximately 5 minutes, repeat angiogram performed through the microcatheter within the branch. Following embolization this branch is occluded and the abnormal vascularity to the angiomyolipoma is no longer visualized. Microcatheter removed. Final renal angiogram performed through the 5 French Chung catheter. This confirms preservation of the main renal artery supply to the left kidney. No additional abnormal vascularity visualized. Injection of the right common femoral artery sheath confirms adequate access for closure device. Of note, there is beaded appearance of the right external iliac artery compatible with nonocclusive fibromuscular dysplasia. Right common femoral, proximal profunda femoral, proximal superficial femoral arteries are all patent. Successful deployment of the CELT device for hemostasis. 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 Electronically Signed   By: Judie Petit.  Shick M.D.   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 Remote embolization 2018. EXAM: ULTRASOUND GUIDANCE FOR VASCULAR ACCESS LEFT RENAL ANGIOGRAM PERIPHERAL MICRO CATHETERIZATIONS AND ANGIOGRAMS OF 3 ADDITIONAL SEGMENTAL BRANCHES TO THE LEFT KIDNEY UPPER POLE PERIPHERAL EMBOLIZATION OF AN ABNORMAL LEFT INTERPOLE SEGMENTAL BRANCH SUPPLYING THE RENAL  ANGIOMYOLIPOMA MEDICATIONS: 1% LIDOCAINE LOCAL. ANESTHESIA/SEDATION: Moderate (conscious) sedation was employed during this procedure. A total of Versed 2.0 mg and Fentanyl 100 mcg was administered intravenously by the radiology nurse. Total intra-service moderate Sedation Time: 56 minutes. The patient's level of consciousness and vital signs were monitored continuously by radiology nursing throughout the procedure under my direct supervision. CONTRAST:  76 cc omni 300 FLUOROSCOPY: Radiation Exposure Index (as provided by the fluoroscopic device): 1,414 MGy Kerma COMPLICATIONS: None immediate. PROCEDURE: Informed consent was obtained from the patient following explanation of the procedure, risks, benefits and alternatives. The patient understands, agrees and consents for the procedure. All questions were addressed. A time out was performed prior to the initiation of the procedure. Maximal barrier sterile technique utilized including caps, mask, sterile gowns, sterile gloves, large sterile drape, hand hygiene, and Betadine prep. Under sterile conditions and local anesthesia, ultrasound micropuncture access performed of the right common femoral artery. Images obtained for documentation of the patent right common femoral artery. Five French sheath inserted over a Bentson guidewire. Initially, a C2 catheter was utilized to select the left renal artery. Initial left renal angiogram performed. Left renal artery origin is widely patent. Minor nonocclusive atherosclerotic change of the main renal artery. Peripheral branches are patent. Previous coil embolization noted within the mid to upper pole region. Initial angiogram demonstrates trace abnormal vascularity along the left kidney lateral cortical margin suspicious for an area of bleeding versus tumor vascularity. Catheter exchangeD for a chung 2.5 catheter. This was reformed in the thoracic aorta and retracted to select the left renal artery. Additional left renal  angiograms performed in oblique projections. Again, from the mid pole laterally there is abnormal vascularity outside of the renal parenchymal phase concerning for an area of arterial bleeding when correlated with CTA. Through  the chung 2.5 catheter, the Renegade STC catheter was advanced over a fathom 14 guidewire initially into an upper pole segmental branch. Angiogram within this branch demonstrates normal parenchymal staining without tumor vascularity. Microcatheter retracted and utilized to select an additional segmental branch to the upper pole. Angiogram within this second branch demonstrates normal renal vascularity as well. Microcatheter retracted and advanced over a GT Glidewire into mid pole segmental branches. Additional angiogram demonstrates normal renal vascularity. Faint tumor vascularity still visualized laterally. Over the fathom 14 guidewire, eventually the microcatheter was advanced into an interpolar branch close to the existing microcoils. Angiogram within this segmental branch demonstrates enlarged tortuous tumor vascularity with contrast extravasation into the surrounding retroperitoneal space. This correlates with the CTA. From this location, embolization was performed. Embolization: Through the Renegade STC catheter, 3 cc of 300-500 micron embospheres were slowly instilled under intermittent fluoroscopy watching for reflux. Once reflux was visualized, a single 3mm x 4 cm Azur CX detachable microcoil was successfully deployed within the interpolar abnormal segmental branch. After approximately 5 minutes, repeat angiogram performed through the microcatheter within the branch. Following embolization this branch is occluded and the abnormal vascularity to the angiomyolipoma is no longer visualized. Microcatheter removed. Final renal angiogram performed through the 5 French Chung catheter. This confirms preservation of the main renal artery supply to the left kidney. No additional abnormal  vascularity visualized. Injection of the right common femoral artery sheath confirms adequate access for closure device. Of note, there is beaded appearance of the right external iliac artery compatible with nonocclusive fibromuscular dysplasia. Right common femoral, proximal profunda femoral, proximal superficial femoral arteries are all patent. Successful deployment of the CELT device for hemostasis. 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 Electronically Signed   By: Judie Petit.  Shick M.D.   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 Remote embolization 2018. EXAM: ULTRASOUND GUIDANCE FOR VASCULAR ACCESS LEFT RENAL ANGIOGRAM PERIPHERAL MICRO CATHETERIZATIONS AND ANGIOGRAMS OF 3 ADDITIONAL SEGMENTAL BRANCHES TO THE LEFT KIDNEY UPPER POLE PERIPHERAL EMBOLIZATION OF AN ABNORMAL LEFT INTERPOLE SEGMENTAL BRANCH SUPPLYING THE RENAL ANGIOMYOLIPOMA MEDICATIONS: 1% LIDOCAINE LOCAL. ANESTHESIA/SEDATION: Moderate (conscious) sedation was employed during this procedure. A total of Versed 2.0 mg and Fentanyl 100 mcg was administered intravenously by the radiology nurse. Total intra-service moderate Sedation Time: 56 minutes. The patient's level of consciousness and vital signs were monitored continuously by radiology nursing throughout the procedure under my direct supervision. CONTRAST:  76 cc omni 300 FLUOROSCOPY: Radiation Exposure Index (as provided by the fluoroscopic device): 1,414 MGy Kerma COMPLICATIONS: None immediate. PROCEDURE: Informed consent was obtained from the patient following explanation of the procedure, risks, benefits and alternatives. The patient understands, agrees and consents for the  procedure. All questions were addressed. A time out was performed prior to the initiation of the procedure. Maximal barrier sterile technique utilized including caps, mask, sterile gowns, sterile gloves, large sterile drape, hand hygiene, and Betadine prep. Under sterile conditions and local anesthesia, ultrasound micropuncture access performed of the right common femoral artery. Images obtained for documentation of the patent right common femoral artery. Five French sheath inserted over a Bentson guidewire. Initially, a C2 catheter was utilized to select the left renal artery. Initial left renal angiogram performed. Left renal artery origin is widely patent. Minor  nonocclusive atherosclerotic change of the main renal artery. Peripheral branches are patent. Previous coil embolization noted within the mid to upper pole region. Initial angiogram demonstrates trace abnormal vascularity along the left kidney lateral cortical margin suspicious for an area of bleeding versus tumor vascularity. Catheter exchangeD for a chung 2.5 catheter. This was reformed in the thoracic aorta and retracted to select the left renal artery. Additional left renal angiograms performed in oblique projections. Again, from the mid pole laterally there is abnormal vascularity outside of the renal parenchymal phase concerning for an area of arterial bleeding when correlated with CTA. Through the chung 2.5 catheter, the Renegade STC catheter was advanced over a fathom 14 guidewire initially into an upper pole segmental branch. Angiogram within this branch demonstrates normal parenchymal staining without tumor vascularity. Microcatheter retracted and utilized to select an additional segmental branch to the upper pole. Angiogram within this second branch demonstrates normal renal vascularity as well. Microcatheter retracted and advanced over a GT Glidewire into mid pole segmental branches. Additional angiogram demonstrates normal renal vascularity.  Faint tumor vascularity still visualized laterally. Over the fathom 14 guidewire, eventually the microcatheter was advanced into an interpolar branch close to the existing microcoils. Angiogram within this segmental branch demonstrates enlarged tortuous tumor vascularity with contrast extravasation into the surrounding retroperitoneal space. This correlates with the CTA. From this location, embolization was performed. Embolization: Through the Renegade STC catheter, 3 cc of 300-500 micron embospheres were slowly instilled under intermittent fluoroscopy watching for reflux. Once reflux was visualized, a single 3mm x 4 cm Azur CX detachable microcoil was successfully deployed within the interpolar abnormal segmental branch. After approximately 5 minutes, repeat angiogram performed through the microcatheter within the branch. Following embolization this branch is occluded and the abnormal vascularity to the angiomyolipoma is no longer visualized. Microcatheter removed. Final renal angiogram performed through the 5 French Chung catheter. This confirms preservation of the main renal artery supply to the left kidney. No additional abnormal vascularity visualized. Injection of the right common femoral artery sheath confirms adequate access for closure device. Of note, there is beaded appearance of the right external iliac artery compatible with nonocclusive fibromuscular dysplasia. Right common femoral, proximal profunda femoral, proximal superficial femoral arteries are all patent. Successful deployment of the CELT device for hemostasis. 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 Electronically Signed   By: Judie Petit.  Shick M.D.   On: 03/11/2023 08:19   CT ABDOMEN PELVIS W CONTRAST  Result Date: 03/10/2023 CLINICAL DATA:  Acute  generalized abdominal pain. EXAM: CT ABDOMEN AND PELVIS WITH CONTRAST TECHNIQUE: Multidetector CT imaging of the abdomen and pelvis was performed using the standard protocol following bolus administration of intravenous contrast. RADIATION DOSE REDUCTION: This exam was performed according to the departmental dose-optimization program which includes automated exposure control, adjustment of the mA and/or kV according to patient size and/or use of iterative reconstruction technique. CONTRAST:  OMNIPAQUE IOHEXOL 300 MG/ML  SOLN COMPARISON:  September 15, 2022. FINDINGS: Lower chest: No acute abnormality. Hepatobiliary: No cholelithiasis or biliary dilatation is noted. Multiple hepatic cysts are again noted. Pancreas: Unremarkable. No pancreatic ductal dilatation or surrounding inflammatory changes. Spleen: Normal in size without focal abnormality. Adrenals/Urinary Tract: Adrenal glands are unremarkable. Small nonobstructive right renal calculus is noted. No hydronephrosis. Urinary bladder is unremarkable. The patient is status post embolization of left renal angiomyolipoma according to prior  exam. However, there is now interval development of large subcapsular hematoma and associated left perirenal hematoma superior to the left kidney measuring 7.3 x 5.3 cm. There does appear to be active extravasation of contrast suggesting active hemorrhage. Stomach/Bowel: Stomach is within normal limits. Appendix appears normal. No evidence of bowel wall thickening, distention, or inflammatory changes. Sigmoid diverticulosis without inflammation. Vascular/Lymphatic: Aortic atherosclerosis. No enlarged abdominal or pelvic lymph nodes. Reproductive: Uterus is unremarkable. Stable right adnexal mass is noted with calcifications, most consistent with fibroid as noted on prior studies. Other: No ascites or hernia is noted. Musculoskeletal: Old T12, L2 and L4 fractures are noted. No acute osseous abnormality is noted. IMPRESSION:  Patient is reportedly status post embolization of left renal angiomyolipoma according to prior exam. There is now interval development of large left renal subcapsular hematoma and associated left pararenal hematoma superior to the left kidney, active extravasation of contrast suggesting active hemorrhage. Critical Value/emergent results were called by telephone at the time of interpretation on 03/10/2023 at 4:08 pm to provider Dr. Charm Barges, who verbally acknowledged these results. Sigmoid diverticulosis without inflammation. Aortic Atherosclerosis (ICD10-I70.0). Electronically Signed   By: Lupita Raider M.D.   On: 03/10/2023 16:10    Pending Labs Unresulted Labs (From admission, onward)     Start     Ordered   03/11/23 0923  CBC with Differential/Platelet  Add-on,   AD        03/11/23 0922   03/10/23 2200  Hemoglobin and hematocrit, blood  4 times daily,   R (with TIMED occurrences)      03/10/23 2134   03/10/23 1627  Heparin level (unfractionated) - if patient on rivaoxaban (Xarelto) or apixaban (Eliquis)  ONCE - URGENT,   URGENT        03/10/23 1626            Vitals/Pain Today's Vitals   03/11/23 1030 03/11/23 1209 03/11/23 1309 03/11/23 1400  BP:  134/72  (!) 142/65  Pulse: (!) 102 (!) 106  (!) 113  Resp: 14 16  (!) 22  Temp:  97.6 F (36.4 C)    TempSrc:  Oral    SpO2: 99% 100%  100%  Weight:      Height:      PainSc:   Asleep     Isolation Precautions No active isolations  Medications Medications  prothrombin complex conc human (KCENTRA) injection (  Not Given 03/10/23 1706)  amiodarone (PACERONE) tablet 100 mg (100 mg Oral Given 03/11/23 0911)  sodium chloride flush (NS) 0.9 % injection 3 mL (3 mLs Intravenous Given 03/11/23 0912)  sodium chloride flush (NS) 0.9 % injection 3 mL (has no administration in time range)  0.9 %  sodium chloride infusion (has no administration in time range)  acetaminophen (TYLENOL) tablet 650 mg (has no administration in time range)    Or   acetaminophen (TYLENOL) suppository 650 mg (has no administration in time range)  morphine (PF) 2 MG/ML injection 2 mg (2 mg Intravenous Given 03/11/23 1209)  docusate sodium (COLACE) capsule 100 mg (100 mg Oral Given 03/11/23 0910)  hydrALAZINE (APRESOLINE) injection 10 mg (has no administration in time range)  0.9 %  sodium chloride infusion (Manually program via Guardrails IV Fluids) (0 mLs Intravenous Hold 03/10/23 2208)  ondansetron (ZOFRAN-ODT) disintegrating tablet 4 mg (4 mg Oral Given 03/10/23 1359)  morphine (PF) 4 MG/ML injection 4 mg (4 mg Intravenous Given 03/10/23 1500)  iohexol (OMNIPAQUE) 300 MG/ML solution 100 mL (100 mLs Intravenous Contrast  Given 03/10/23 1530)  potassium chloride SA (KLOR-CON M) CR tablet 40 mEq (40 mEq Oral Given 03/10/23 1602)  HYDROmorphone (DILAUDID) injection 1 mg (1 mg Intravenous Given 03/10/23 1602)  prothrombin complex conc human (KCENTRA) IVPB 3,805 Units (0 Units Intravenous Stopped 03/10/23 1750)  midazolam (VERSED) injection (1 mg Intravenous Given 03/10/23 1913)  fentaNYL (SUBLIMAZE) injection (50 mcg Intravenous Given 03/10/23 1913)  lidocaine (XYLOCAINE) 1 % (with pres) injection 20 mL (10 mLs Intradermal Given 03/10/23 1844)  iohexol (OMNIPAQUE) 300 MG/ML solution 150 mL (56 mLs Intra-arterial Contrast Given 03/10/23 1929)  iohexol (OMNIPAQUE) 300 MG/ML solution 100 mL (20 mLs Intra-arterial Contrast Given 03/10/23 1929)  fentaNYL (SUBLIMAZE) injection 25 mcg (25 mcg Intravenous Given 03/10/23 2126)  sodium zirconium cyclosilicate (LOKELMA) packet 10 g (10 g Oral Given 03/11/23 0954)    Mobility walks with person assist     Focused Assessments G/I   R Recommendations: See Admitting Provider Note  Report given to:   Additional Notes:

## 2023-03-11 NOTE — ED Notes (Signed)
Patient assisted to the bedpan to void. Patient was cleaned, bed pan removed. Patient is now laying flat, resting in bed.

## 2023-03-11 NOTE — Progress Notes (Signed)
Referring Physician(s): Dr Janalyn Shy  Supervising Physician: Roanna Banning  Patient Status:  [facility] - In-pt  Chief Complaint:  Hx L hypervascular renal angiomyolipoma Coil embolization in IR 05/13/17  Subjective:  New onset abd pain and nausea x few days Came to ED last night CT: IMPRESSION:  There is now interval development of large left renal  subcapsular hematoma and associated left pararenal hematoma superior to the left kidney, active extravasation of contrast suggesting active hemorrhage.  IR Procedure: left renal angio and peripheral branch embo with 300-500 embospheres and 3mm coil with Dr Miles Costain  Doing well today Denies pain; feels better Abd still feels bloated H/H stable  To be admitted to [facility]  Allergies: Patient has no known allergies.  Medications: Prior to Admission medications   Medication Sig Start Date End Date Taking? Authorizing Provider  amiodarone (PACERONE) 200 MG tablet Take 0.5 tablets (100 mg total) by mouth daily. 10/10/22  Yes Corky Crafts, MD  apixaban (ELIQUIS) 5 MG TABS tablet Take 1 tablet (5 mg total) by mouth 2 (two) times daily. 10/10/22  Yes Corky Crafts, MD  Bacillus Coagulans-Inulin (PROBIOTIC) 1-250 BILLION-MG CAPS Take 1 capsule by mouth daily.   Yes [provider]  Cholecalciferol (VITAMIN D) 2000 UNITS tablet Take 2,000 Units by mouth daily.   Yes [provider]  estradiol (VIVELLE-DOT) 0.0375 MG/24HR Place 1 patch onto the skin 2 (two) times a week. 04/16/21  Yes [provider]  fluticasone (FLONASE) 50 MCG/ACT nasal spray Place 2 sprays into the nose daily. Patient taking differently: Place 2 sprays into the nose daily as needed for allergies. 07/13/12  Yes Hodgin, Acie Fredrickson, MD  magnesium gluconate (MAGONATE) 500 MG tablet Take 500 mg by mouth 2 (two) times daily.   Yes [provider]  milk thistle 175 MG tablet Take 175 mg by mouth daily.   Yes [provider]  OVER  THE COUNTER MEDICATION Take 1 tablet by mouth daily at 6 (six) AM. Medication: Vitamin K7   Yes [provider]  progesterone (PROMETRIUM) 100 MG capsule TAKE ONE CAPSULE BY MOUTH DAILY Patient taking differently: Take 100 mg by mouth daily. 02/23/19  Yes Jerene Bears, MD     Vital Signs: BP (!) 143/89   Pulse 95   Temp 97.6 F (36.4 C) (Oral)   Resp 16   Ht 5' 6.5" (1.689 m)   Wt 165 lb (74.8 kg)   LMP 08/18/1994 (Approximate)   SpO2 99%   BMI 26.23 kg/m   Physical Exam Vitals reviewed.  Abdominal:     General: There is distension.     Palpations: Abdomen is soft.     Tenderness: There is no abdominal tenderness.  Skin:    General: Skin is warm.     Comments: Rt groin is NT no bleeding; no hematoma Rt foot good pulses  Neurological:     Mental Status: She is alert.     Imaging: IR US Guide Vasc Access Right  Result Date: 03/11/2023 INDICATION: Acute arterial bleeding from a known large left renal angiomyolipoma. Remote embolization 2018. EXAM: ULTRASOUND GUIDANCE FOR VASCULAR ACCESS LEFT RENAL ANGIOGRAM PERIPHERAL MICRO CATHETERIZATIONS AND ANGIOGRAMS OF 3 ADDITIONAL SEGMENTAL BRANCHES TO THE LEFT KIDNEY UPPER POLE PERIPHERAL EMBOLIZATION OF AN ABNORMAL LEFT INTERPOLE SEGMENTAL BRANCH SUPPLYING THE RENAL ANGIOMYOLIPOMA MEDICATIONS: 1% LIDOCAINE LOCAL. ANESTHESIA/SEDATION: Moderate (conscious) sedation was employed during this procedure. A total of Versed 2.0 mg and Fentanyl 100 mcg was administered intravenously by the radiology  nurse. Total intra-service moderate Sedation Time: 56 minutes. The patient's level of consciousness and vital signs were monitored continuously by radiology nursing throughout the procedure under my direct supervision. CONTRAST:  76 cc omni 300 FLUOROSCOPY: Radiation Exposure Index (as provided by the fluoroscopic device): 1,414 MGy Kerma COMPLICATIONS: None immediate. PROCEDURE: Informed consent was obtained from the patient following  explanation of the procedure, risks, benefits and alternatives. The patient understands, agrees and consents for the procedure. All questions were addressed. A time out was performed prior to the initiation of the procedure. Maximal barrier sterile technique utilized including caps, mask, sterile gowns, sterile gloves, large sterile drape, hand hygiene, and Betadine prep. Under sterile conditions and local anesthesia, ultrasound micropuncture access performed of the right common femoral artery. Images obtained for documentation of the patent right common femoral artery. Five French sheath inserted over a Bentson guidewire. Initially, a C2 catheter was utilized to select the left renal artery. Initial left renal angiogram performed. Left renal artery origin is widely patent. Minor nonocclusive atherosclerotic change of the main renal artery. Peripheral branches are patent. Previous coil embolization noted within the mid to upper pole region. Initial angiogram demonstrates trace abnormal vascularity along the left kidney lateral cortical margin suspicious for an area of bleeding versus tumor vascularity. Catheter exchangeD for a chung 2.5 catheter. This was reformed in the thoracic aorta and retracted to select the left renal artery. Additional left renal angiograms performed in oblique projections. Again, from the mid pole laterally there is abnormal vascularity outside of the renal parenchymal phase concerning for an area of arterial bleeding when correlated with CTA. Through the chung 2.5 catheter, the Renegade STC catheter was advanced over a fathom 14 guidewire initially into an upper pole segmental branch. Angiogram within this branch demonstrates normal parenchymal staining without tumor vascularity. Microcatheter retracted and utilized to select an additional segmental branch to the upper pole. Angiogram within this second branch demonstrates normal renal vascularity as well. Microcatheter retracted and  advanced over a GT Glidewire into mid pole segmental branches. Additional angiogram demonstrates normal renal vascularity. Faint tumor vascularity still visualized laterally. Over the fathom 14 guidewire, eventually the microcatheter was advanced into an interpolar branch close to the existing microcoils. Angiogram within this segmental branch demonstrates enlarged tortuous tumor vascularity with contrast extravasation into the surrounding retroperitoneal space. This correlates with the CTA. From this location, embolization was performed. Embolization: Through the Renegade STC catheter, 3 cc of 300-500 micron embospheres were slowly instilled under intermittent fluoroscopy watching for reflux. Once reflux was visualized, a single 3mm x 4 cm Azur CX detachable microcoil was successfully deployed within the interpolar abnormal segmental branch. After approximately 5 minutes, repeat angiogram performed through the microcatheter within the branch. Following embolization this branch is occluded and the abnormal vascularity to the angiomyolipoma is no longer visualized. Microcatheter removed. Final renal angiogram performed through the 5 French Chung catheter. This confirms preservation of the main renal artery supply to the left kidney. No additional abnormal vascularity visualized. Injection of the right common femoral artery sheath confirms adequate access for closure device. Of note, there is beaded appearance of the right external iliac artery compatible with nonocclusive fibromuscular dysplasia. Right common femoral, proximal profunda femoral, proximal superficial femoral arteries are all patent. Successful deployment of the CELT device for hemostasis. 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 Electronically Signed  By: Osvaldo Shipper M.D.   On: 03/11/2023 08:19   IR Angiogram Renal Left Selective  Result Date: 03/11/2023 INDICATION: Acute arterial bleeding from a known large left renal angiomyolipoma. Remote embolization 2018. EXAM: ULTRASOUND GUIDANCE FOR VASCULAR ACCESS LEFT RENAL ANGIOGRAM PERIPHERAL MICRO CATHETERIZATIONS AND ANGIOGRAMS OF 3 ADDITIONAL SEGMENTAL BRANCHES TO THE LEFT KIDNEY UPPER POLE PERIPHERAL EMBOLIZATION OF AN ABNORMAL LEFT INTERPOLE SEGMENTAL BRANCH SUPPLYING THE RENAL ANGIOMYOLIPOMA MEDICATIONS: 1% LIDOCAINE LOCAL. ANESTHESIA/SEDATION: Moderate (conscious) sedation was employed during this procedure. A total of Versed 2.0 mg and Fentanyl 100 mcg was administered intravenously by the radiology nurse. Total intra-service moderate Sedation Time: 56 minutes. The patient's level of consciousness and vital signs were monitored continuously by radiology nursing throughout the procedure under my direct supervision. CONTRAST:  76 cc omni 300 FLUOROSCOPY: Radiation Exposure Index (as provided by the fluoroscopic device): 1,414 MGy Kerma COMPLICATIONS: None immediate. PROCEDURE: Informed consent was obtained from the patient following explanation of the procedure, risks, benefits and alternatives. The patient understands, agrees and consents for the procedure. All questions were addressed. A time out was performed prior to the initiation of the procedure. Maximal barrier sterile technique utilized including caps, mask, sterile gowns, sterile gloves, large sterile drape, hand hygiene, and Betadine prep. Under sterile conditions and local anesthesia, ultrasound micropuncture access performed of the right common femoral artery. Images obtained for documentation of the patent right common femoral artery. Five French sheath inserted over a Bentson guidewire. Initially, a C2 catheter was utilized to select the left renal artery. Initial left renal angiogram performed. Left renal artery origin is widely patent. Minor  nonocclusive atherosclerotic change of the main renal artery. Peripheral branches are patent. Previous coil embolization noted within the mid to upper pole region. Initial angiogram demonstrates trace abnormal vascularity along the left kidney lateral cortical margin suspicious for an area of bleeding versus tumor vascularity. Catheter exchangeD for a chung 2.5 catheter. This was reformed in the thoracic aorta and retracted to select the left renal artery. Additional left renal angiograms performed in oblique projections. Again, from the mid pole laterally there is abnormal vascularity outside of the renal parenchymal phase concerning for an area of arterial bleeding when correlated with CTA. Through the chung 2.5 catheter, the Renegade STC catheter was advanced over a fathom 14 guidewire initially into an upper pole segmental branch. Angiogram within this branch demonstrates normal parenchymal staining without tumor vascularity. Microcatheter retracted and utilized to select an additional segmental branch to the upper pole. Angiogram within this second branch demonstrates normal renal vascularity as well. Microcatheter retracted and advanced over a GT Glidewire into mid pole segmental branches. Additional angiogram demonstrates normal renal vascularity. Faint tumor vascularity still visualized laterally. Over the fathom 14 guidewire, eventually the microcatheter was advanced into an interpolar branch close to the existing microcoils. Angiogram within this segmental branch demonstrates enlarged tortuous tumor vascularity with contrast extravasation into the surrounding retroperitoneal space. This correlates with the CTA. From this location, embolization was performed. Embolization: Through the Renegade STC catheter, 3 cc of 300-500 micron embospheres were slowly instilled under intermittent fluoroscopy watching for reflux. Once reflux was visualized, a single 3mm x 4 cm Azur CX detachable microcoil was successfully  deployed within the interpolar abnormal segmental branch. After approximately 5 minutes, repeat angiogram performed through the microcatheter within the branch. Following embolization this branch is occluded and the abnormal vascularity to the angiomyolipoma is no longer visualized. Microcatheter removed. Final renal angiogram performed through the 5 French Chung catheter. This confirms  preservation of the main renal artery supply to the left kidney. No additional abnormal vascularity visualized. Injection of the right common femoral artery sheath confirms adequate access for closure device. Of note, there is beaded appearance of the right external iliac artery compatible with nonocclusive fibromuscular dysplasia. Right common femoral, proximal profunda femoral, proximal superficial femoral arteries are all patent. Successful deployment of the CELT device for hemostasis. 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 Electronically Signed   By: Judie Petit.  Shick M.D.   On: 03/11/2023 08:19   IR EMBO ART  VEN HEMORR LYMPH EXTRAV  INC GUIDE ROADMAPPING  Result Date: 03/11/2023 INDICATION: Acute arterial bleeding from a known large left renal angiomyolipoma. Remote embolization 2018. EXAM: ULTRASOUND GUIDANCE FOR VASCULAR ACCESS LEFT RENAL ANGIOGRAM PERIPHERAL MICRO CATHETERIZATIONS AND ANGIOGRAMS OF 3 ADDITIONAL SEGMENTAL BRANCHES TO THE LEFT KIDNEY UPPER POLE PERIPHERAL EMBOLIZATION OF AN ABNORMAL LEFT INTERPOLE SEGMENTAL BRANCH SUPPLYING THE RENAL ANGIOMYOLIPOMA MEDICATIONS: 1% LIDOCAINE LOCAL. ANESTHESIA/SEDATION: Moderate (conscious) sedation was employed during this procedure. A total of Versed 2.0 mg and Fentanyl 100 mcg was administered intravenously by the radiology nurse. Total intra-service moderate Sedation Time: 56 minutes.  The patient's level of consciousness and vital signs were monitored continuously by radiology nursing throughout the procedure under my direct supervision. CONTRAST:  76 cc omni 300 FLUOROSCOPY: Radiation Exposure Index (as provided by the fluoroscopic device): 1,414 MGy Kerma COMPLICATIONS: None immediate. PROCEDURE: Informed consent was obtained from the patient following explanation of the procedure, risks, benefits and alternatives. The patient understands, agrees and consents for the procedure. All questions were addressed. A time out was performed prior to the initiation of the procedure. Maximal barrier sterile technique utilized including caps, mask, sterile gowns, sterile gloves, large sterile drape, hand hygiene, and Betadine prep. Under sterile conditions and local anesthesia, ultrasound micropuncture access performed of the right common femoral artery. Images obtained for documentation of the patent right common femoral artery. Five French sheath inserted over a Bentson guidewire. Initially, a C2 catheter was utilized to select the left renal artery. Initial left renal angiogram performed. Left renal artery origin is widely patent. Minor nonocclusive atherosclerotic change of the main renal artery. Peripheral branches are patent. Previous coil embolization noted within the mid to upper pole region. Initial angiogram demonstrates trace abnormal vascularity along the left kidney lateral cortical margin suspicious for an area of bleeding versus tumor vascularity. Catheter exchangeD for a chung 2.5 catheter. This was reformed in the thoracic aorta and retracted to select the left renal artery. Additional left renal angiograms performed in oblique projections. Again, from the mid pole laterally there is abnormal vascularity outside of the renal parenchymal phase concerning for an area of arterial bleeding when correlated with CTA. Through the chung 2.5 catheter, the Renegade STC catheter was advanced over a  fathom 14 guidewire initially into an upper pole segmental branch. Angiogram within this branch demonstrates normal parenchymal staining without tumor vascularity. Microcatheter retracted and utilized to select an additional segmental branch to the upper pole. Angiogram within this second branch demonstrates normal renal vascularity as well. Microcatheter retracted and advanced over a GT Glidewire into mid pole segmental branches. Additional angiogram demonstrates normal renal vascularity. Faint tumor vascularity still visualized laterally. Over the fathom 14 guidewire, eventually the microcatheter was advanced into an interpolar branch close to the existing microcoils. Angiogram within this segmental branch demonstrates enlarged tortuous  tumor vascularity with contrast extravasation into the surrounding retroperitoneal space. This correlates with the CTA. From this location, embolization was performed. Embolization: Through the Renegade STC catheter, 3 cc of 300-500 micron embospheres were slowly instilled under intermittent fluoroscopy watching for reflux. Once reflux was visualized, a single 3mm x 4 cm Azur CX detachable microcoil was successfully deployed within the interpolar abnormal segmental branch. After approximately 5 minutes, repeat angiogram performed through the microcatheter within the branch. Following embolization this branch is occluded and the abnormal vascularity to the angiomyolipoma is no longer visualized. Microcatheter removed. Final renal angiogram performed through the 5 French Chung catheter. This confirms preservation of the main renal artery supply to the left kidney. No additional abnormal vascularity visualized. Injection of the right common femoral artery sheath confirms adequate access for closure device. Of note, there is beaded appearance of the right external iliac artery compatible with nonocclusive fibromuscular dysplasia. Right common femoral, proximal profunda femoral,  proximal superficial femoral arteries are all patent. Successful deployment of the CELT device for hemostasis. 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 Electronically Signed   By: Judie Petit.  Shick M.D.   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 Remote embolization 2018. EXAM: ULTRASOUND GUIDANCE FOR VASCULAR ACCESS LEFT RENAL ANGIOGRAM PERIPHERAL MICRO CATHETERIZATIONS AND ANGIOGRAMS OF 3 ADDITIONAL SEGMENTAL BRANCHES TO THE LEFT KIDNEY UPPER POLE PERIPHERAL EMBOLIZATION OF AN ABNORMAL LEFT INTERPOLE SEGMENTAL BRANCH SUPPLYING THE RENAL ANGIOMYOLIPOMA MEDICATIONS: 1% LIDOCAINE LOCAL. ANESTHESIA/SEDATION: Moderate (conscious) sedation was employed during this procedure. A total of Versed 2.0 mg and Fentanyl 100 mcg was administered intravenously by the radiology nurse. Total intra-service moderate Sedation Time: 56 minutes. The patient's level of consciousness and vital signs were monitored continuously by radiology nursing throughout the procedure under my direct supervision. CONTRAST:  76 cc omni 300 FLUOROSCOPY: Radiation Exposure Index (as provided by the fluoroscopic device): 1,414 MGy Kerma COMPLICATIONS: None immediate. PROCEDURE: Informed consent was obtained from the patient following explanation of the procedure, risks, benefits and alternatives. The patient understands, agrees and consents for the procedure. All questions were addressed. A time out was performed prior to the initiation of the procedure. Maximal barrier sterile technique utilized including caps, mask, sterile gowns, sterile gloves, large sterile drape, hand hygiene, and Betadine prep. Under sterile conditions and local  anesthesia, ultrasound micropuncture access performed of the right common femoral artery. Images obtained for documentation of the patent right common femoral artery. Five French sheath inserted over a Bentson guidewire. Initially, a C2 catheter was utilized to select the left renal artery. Initial left renal angiogram performed. Left renal artery origin is widely patent. Minor nonocclusive atherosclerotic change of the main renal artery. Peripheral branches are patent. Previous coil embolization noted within the mid to upper pole region. Initial angiogram demonstrates trace abnormal vascularity along the left kidney lateral cortical margin suspicious for an area of bleeding versus tumor vascularity. Catheter exchangeD for a chung 2.5 catheter. This was reformed in the thoracic aorta and retracted to select the left renal artery. Additional left renal angiograms performed in oblique projections. Again, from the mid pole laterally there is abnormal vascularity outside of the renal parenchymal phase concerning for an area of arterial bleeding when correlated with CTA. Through the chung 2.5 catheter, the Renegade STC  catheter was advanced over a fathom 14 guidewire initially into an upper pole segmental branch. Angiogram within this branch demonstrates normal parenchymal staining without tumor vascularity. Microcatheter retracted and utilized to select an additional segmental branch to the upper pole. Angiogram within this second branch demonstrates normal renal vascularity as well. Microcatheter retracted and advanced over a GT Glidewire into mid pole segmental branches. Additional angiogram demonstrates normal renal vascularity. Faint tumor vascularity still visualized laterally. Over the fathom 14 guidewire, eventually the microcatheter was advanced into an interpolar branch close to the existing microcoils. Angiogram within this segmental branch demonstrates enlarged tortuous tumor vascularity with contrast  extravasation into the surrounding retroperitoneal space. This correlates with the CTA. From this location, embolization was performed. Embolization: Through the Renegade STC catheter, 3 cc of 300-500 micron embospheres were slowly instilled under intermittent fluoroscopy watching for reflux. Once reflux was visualized, a single 3mm x 4 cm Azur CX detachable microcoil was successfully deployed within the interpolar abnormal segmental branch. After approximately 5 minutes, repeat angiogram performed through the microcatheter within the branch. Following embolization this branch is occluded and the abnormal vascularity to the angiomyolipoma is no longer visualized. Microcatheter removed. Final renal angiogram performed through the 5 French Chung catheter. This confirms preservation of the main renal artery supply to the left kidney. No additional abnormal vascularity visualized. Injection of the right common femoral artery sheath confirms adequate access for closure device. Of note, there is beaded appearance of the right external iliac artery compatible with nonocclusive fibromuscular dysplasia. Right common femoral, proximal profunda femoral, proximal superficial femoral arteries are all patent. Successful deployment of the CELT device for hemostasis. 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 Electronically Signed   By: Judie Petit.  Shick M.D.   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 Remote embolization 2018. EXAM: ULTRASOUND GUIDANCE FOR VASCULAR ACCESS LEFT RENAL ANGIOGRAM PERIPHERAL MICRO CATHETERIZATIONS AND ANGIOGRAMS OF 3 ADDITIONAL SEGMENTAL BRANCHES TO THE LEFT KIDNEY UPPER POLE PERIPHERAL  EMBOLIZATION OF AN ABNORMAL LEFT INTERPOLE SEGMENTAL BRANCH SUPPLYING THE RENAL ANGIOMYOLIPOMA MEDICATIONS: 1% LIDOCAINE LOCAL. ANESTHESIA/SEDATION: Moderate (conscious) sedation was employed during this procedure. A total of Versed 2.0 mg and Fentanyl 100 mcg was administered intravenously by the radiology nurse. Total intra-service moderate Sedation Time: 56 minutes. The patient's level of consciousness and vital signs were monitored continuously by radiology nursing throughout the procedure under my direct supervision. CONTRAST:  76 cc omni 300 FLUOROSCOPY: Radiation Exposure Index (as provided by the fluoroscopic device): 1,414 MGy Kerma COMPLICATIONS: None immediate. PROCEDURE: Informed consent was obtained from the patient following explanation of the procedure, risks, benefits and alternatives. The patient understands, agrees and consents for the procedure. All questions were addressed. A time out was performed prior to the initiation of the procedure. Maximal barrier sterile technique utilized including caps, mask, sterile gowns, sterile gloves, large sterile drape, hand hygiene, and Betadine prep. Under sterile conditions and local anesthesia, ultrasound micropuncture access performed of the right common femoral artery. Images obtained for documentation of the patent right common femoral artery. Five French sheath inserted over a Bentson guidewire. Initially, a C2 catheter was utilized to select the left renal artery. Initial left renal angiogram performed. Left renal artery origin is widely patent. Minor nonocclusive atherosclerotic change of the main renal  artery. Peripheral branches are patent. Previous coil embolization noted within the mid to upper pole region. Initial angiogram demonstrates trace abnormal vascularity along the left kidney lateral cortical margin suspicious for an area of bleeding versus tumor vascularity. Catheter exchangeD for a chung 2.5 catheter. This was reformed in the thoracic  aorta and retracted to select the left renal artery. Additional left renal angiograms performed in oblique projections. Again, from the mid pole laterally there is abnormal vascularity outside of the renal parenchymal phase concerning for an area of arterial bleeding when correlated with CTA. Through the chung 2.5 catheter, the Renegade STC catheter was advanced over a fathom 14 guidewire initially into an upper pole segmental branch. Angiogram within this branch demonstrates normal parenchymal staining without tumor vascularity. Microcatheter retracted and utilized to select an additional segmental branch to the upper pole. Angiogram within this second branch demonstrates normal renal vascularity as well. Microcatheter retracted and advanced over a GT Glidewire into mid pole segmental branches. Additional angiogram demonstrates normal renal vascularity. Faint tumor vascularity still visualized laterally. Over the fathom 14 guidewire, eventually the microcatheter was advanced into an interpolar branch close to the existing microcoils. Angiogram within this segmental branch demonstrates enlarged tortuous tumor vascularity with contrast extravasation into the surrounding retroperitoneal space. This correlates with the CTA. From this location, embolization was performed. Embolization: Through the Renegade STC catheter, 3 cc of 300-500 micron embospheres were slowly instilled under intermittent fluoroscopy watching for reflux. Once reflux was visualized, a single 3mm x 4 cm Azur CX detachable microcoil was successfully deployed within the interpolar abnormal segmental branch. After approximately 5 minutes, repeat angiogram performed through the microcatheter within the branch. Following embolization this branch is occluded and the abnormal vascularity to the angiomyolipoma is no longer visualized. Microcatheter removed. Final renal angiogram performed through the 5 French Chung catheter. This confirms preservation of  the main renal artery supply to the left kidney. No additional abnormal vascularity visualized. Injection of the right common femoral artery sheath confirms adequate access for closure device. Of note, there is beaded appearance of the right external iliac artery compatible with nonocclusive fibromuscular dysplasia. Right common femoral, proximal profunda femoral, proximal superficial femoral arteries are all patent. Successful deployment of the CELT device for hemostasis. 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 Electronically Signed   By: Judie Petit.  Shick M.D.   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 Remote embolization 2018. EXAM: ULTRASOUND GUIDANCE FOR VASCULAR ACCESS LEFT RENAL ANGIOGRAM PERIPHERAL MICRO CATHETERIZATIONS AND ANGIOGRAMS OF 3 ADDITIONAL SEGMENTAL BRANCHES TO THE LEFT KIDNEY UPPER POLE PERIPHERAL EMBOLIZATION OF AN ABNORMAL LEFT INTERPOLE SEGMENTAL BRANCH SUPPLYING THE RENAL ANGIOMYOLIPOMA MEDICATIONS: 1% LIDOCAINE LOCAL. ANESTHESIA/SEDATION: Moderate (conscious) sedation was employed during this procedure. A total of Versed 2.0 mg and Fentanyl 100 mcg was administered intravenously by the radiology nurse. Total intra-service moderate Sedation Time: 56 minutes. The patient's level of consciousness and vital signs were monitored continuously by radiology nursing throughout the procedure under my direct supervision. CONTRAST:  76 cc omni 300 FLUOROSCOPY: Radiation Exposure Index (as provided by the fluoroscopic device): 1,414 MGy Kerma COMPLICATIONS: None immediate. PROCEDURE: Informed consent was obtained from the patient following explanation of the procedure, risks,  benefits and alternatives. The patient understands, agrees and consents for the  procedure. All questions were addressed. A time out was performed prior to the initiation of the procedure. Maximal barrier sterile technique utilized including caps, mask, sterile gowns, sterile gloves, large sterile drape, hand hygiene, and Betadine prep. Under sterile conditions and local anesthesia, ultrasound micropuncture access performed of the right common femoral artery. Images obtained for documentation of the patent right common femoral artery. Five French sheath inserted over a Bentson guidewire. Initially, a C2 catheter was utilized to select the left renal artery. Initial left renal angiogram performed. Left renal artery origin is widely patent. Minor nonocclusive atherosclerotic change of the main renal artery. Peripheral branches are patent. Previous coil embolization noted within the mid to upper pole region. Initial angiogram demonstrates trace abnormal vascularity along the left kidney lateral cortical margin suspicious for an area of bleeding versus tumor vascularity. Catheter exchangeD for a chung 2.5 catheter. This was reformed in the thoracic aorta and retracted to select the left renal artery. Additional left renal angiograms performed in oblique projections. Again, from the mid pole laterally there is abnormal vascularity outside of the renal parenchymal phase concerning for an area of arterial bleeding when correlated with CTA. Through the chung 2.5 catheter, the Renegade STC catheter was advanced over a fathom 14 guidewire initially into an upper pole segmental branch. Angiogram within this branch demonstrates normal parenchymal staining without tumor vascularity. Microcatheter retracted and utilized to select an additional segmental branch to the upper pole. Angiogram within this second branch demonstrates normal renal vascularity as well. Microcatheter retracted and advanced over a GT Glidewire into mid pole  segmental branches. Additional angiogram demonstrates normal renal vascularity. Faint tumor vascularity still visualized laterally. Over the fathom 14 guidewire, eventually the microcatheter was advanced into an interpolar branch close to the existing microcoils. Angiogram within this segmental branch demonstrates enlarged tortuous tumor vascularity with contrast extravasation into the surrounding retroperitoneal space. This correlates with the CTA. From this location, embolization was performed. Embolization: Through the Renegade STC catheter, 3 cc of 300-500 micron embospheres were slowly instilled under intermittent fluoroscopy watching for reflux. Once reflux was visualized, a single 3mm x 4 cm Azur CX detachable microcoil was successfully deployed within the interpolar abnormal segmental branch. After approximately 5 minutes, repeat angiogram performed through the microcatheter within the branch. Following embolization this branch is occluded and the abnormal vascularity to the angiomyolipoma is no longer visualized. Microcatheter removed. Final renal angiogram performed through the 5 French Chung catheter. This confirms preservation of the main renal artery supply to the left kidney. No additional abnormal vascularity visualized. Injection of the right common femoral artery sheath confirms adequate access for closure device. Of note, there is beaded appearance of the right external iliac artery compatible with nonocclusive fibromuscular dysplasia. Right common femoral, proximal profunda femoral, proximal superficial femoral arteries are all patent. Successful deployment of the CELT device for hemostasis. 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 Electronically Signed   By: Judie Petit.  Shick M.D.   On: 03/11/2023 08:19   CT  ABDOMEN PELVIS W CONTRAST  Result Date: 03/10/2023 CLINICAL DATA:  Acute generalized abdominal pain. EXAM: CT ABDOMEN AND PELVIS WITH CONTRAST TECHNIQUE: Multidetector CT imaging of the abdomen and pelvis was performed using the standard protocol following bolus administration of intravenous contrast. RADIATION DOSE REDUCTION: This exam was performed according to the departmental dose-optimization program which includes automated exposure control, adjustment of  the mA and/or kV according to patient size and/or use of iterative reconstruction technique. CONTRAST:  OMNIPAQUE IOHEXOL 300 MG/ML  SOLN COMPARISON:  September 15, 2022. FINDINGS: Lower chest: No acute abnormality. Hepatobiliary: No cholelithiasis or biliary dilatation is noted. Multiple hepatic cysts are again noted. Pancreas: Unremarkable. No pancreatic ductal dilatation or surrounding inflammatory changes. Spleen: Normal in size without focal abnormality. Adrenals/Urinary Tract: Adrenal glands are unremarkable. Small nonobstructive right renal calculus is noted. No hydronephrosis. Urinary bladder is unremarkable. The patient is status post embolization of left renal angiomyolipoma according to prior exam. However, there is now interval development of large subcapsular hematoma and associated left perirenal hematoma superior to the left kidney measuring 7.3 x 5.3 cm. There does appear to be active extravasation of contrast suggesting active hemorrhage. Stomach/Bowel: Stomach is within normal limits. Appendix appears normal. No evidence of bowel wall thickening, distention, or inflammatory changes. Sigmoid diverticulosis without inflammation. Vascular/Lymphatic: Aortic atherosclerosis. No enlarged abdominal or pelvic lymph nodes. Reproductive: Uterus is unremarkable. Stable right adnexal mass is noted with calcifications, most consistent with fibroid as noted on prior studies. Other: No ascites or hernia is noted. Musculoskeletal: Old T12, L2 and L4  fractures are noted. No acute osseous abnormality is noted. IMPRESSION: Patient is reportedly status post embolization of left renal angiomyolipoma according to prior exam. There is now interval development of large left renal subcapsular hematoma and associated left pararenal hematoma superior to the left kidney, active extravasation of contrast suggesting active hemorrhage. Critical Value/emergent results were called by telephone at the time of interpretation on 03/10/2023 at 4:08 pm to provider Dr. Charm Barges, who verbally acknowledged these results. Sigmoid diverticulosis without inflammation. Aortic Atherosclerosis (ICD10-I70.0). Electronically Signed   By: Lupita Raider M.D.   On: 03/10/2023 16:10    Labs:  CBC: Recent Labs    03/10/23 1359 03/10/23 2029 03/10/23 2202 03/11/23 0400  WBC 10.9*  --   --   --   HGB 14.0 12.4 12.6 12.1  HCT 41.5 38.3 38.5 37.9  PLT 185  --   --   --     COAGS: Recent Labs    03/10/23 1627 03/11/23 0400  INR 1.2 1.2  APTT 27 26    BMP: Recent Labs    09/08/22 1313 03/10/23 1359 03/10/23 2200 03/11/23 0400  NA 145* 134* 136 135  K 3.9 3.4* 5.2* 5.3*  CL 105 100 106 106  CO2 23 21* 18* 16*  GLUCOSE 115* 145* 197* 158*  BUN 19 27* 30* 34*  CALCIUM 9.5 9.2 8.7* 8.6*  CREATININE 0.72 0.77 1.17* 0.97  GFRNONAA  --  >60 48* >60    LIVER FUNCTION TESTS: Recent Labs    09/08/22 1313 03/10/23 1359 03/10/23 2200 03/11/23 0400  BILITOT 0.6 1.1 0.8 0.9  AST 20 22 21 22   ALT 14 17 14 14   ALKPHOS 53 49 43 45  PROT 6.2 7.2 6.1* 6.0*  ALBUMIN 4.2 4.4 3.8 3.6    Assessment and Plan:  Hx L renal angiomyolipoma Embolization in IR 2018 New abd pain and nausea x few days ++ left renal  subcapsular hematoma and associated left pararenal hematoma superior to the left kidney IR procedure 7/23 pm: left renal angio and peripheral branch embo with 300-500 embospheres and 3mm coil with Dr Miles Costain Doing well--- planned for admission  Electronically  Signed: Robet Leu, PA-C 03/11/2023, 10:04 AM   I spent a total of 15 Minutes at the the patient's bedside AND on the patient's  hospital floor or unit, greater than 50% of which was counseling/coordinating care for left renal embolization

## 2023-03-11 NOTE — Progress Notes (Signed)
PROGRESS NOTE    Ann Vang  BMW:[id number] DOB: 07-17-1946 DOA: 03/10/2023 PCP: Alysia Penna, MD   Brief Narrative:  Patient is a 77 year old Caucasian female with a past medical history significant for but limited to angiolipoma of the kidney, atrial fibrillation on anticoagulation with Eliquis, diverticulosis, chronic arthritis as well as other comorbidities who presented to the emergency department at [facility] around 2 PM complaining of abdominal pain.  She started to have abdominal pain around noon and is located in the lower abdomen and epigastric area.  She described it as a sharp constant nonradiating and endorsed nausea vomiting.  States that she ate an egg in the morning that she peeled approximately 4 days prior.  Last bowel movement was in the morning.  She saw blood in her urine and states that before her given that she is a lesion tumor in her kidney.  Further workup in the ED was done with a CT scan of the abdomen pelvis which showed a left renal hematoma with active extravasation.  Interventional radiology was consulted and she was given Kcentra for Eliquis reversal and transferred ED to ED to [facility] and underwent a left renal angiogram and peripheral branch embolization and a 3 mm coil placement by Dr. Denny Levy in the evening.  She is being admitted and being observed to ensure that she has no further bleeding and trending her hemoglobin/hematocrit.  Will obtain PT OT to further evaluate and treat and anticipate discharging in the next 24 to 48 hours if improved and hemoglobin is stable with improvement in her abdominal pain.  Assessment and Plan:  Left subcapsular hematoma s/p embolectomy 03/10/2023 History of left renal angiolipoma s/p embolization 2018 -Patient coming with complaining of right-sided abdominal pain.  Patient is hemodynamically stable. -CT abdomen pelvis showed status post embolization of left renal angiomyolipoma according to  prior exam. There is now interval development of large left renal subcapsular hematoma and associated left pararenal hematoma superior to the left kidney, active extravasation of contrast suggesting active hemorrhage. Critical Value/emergent results were called by telephone at the time of interpretation on 03/10/2023 at 4:08 pm to provider Dr. Charm Barges, who verbally acknowledged these results. -Interventional radiology consulted Dr. Joanne Gavel, I have recommended to keep Kcentra for Eliquis reversal and patient was transfer from ED to ED at [facility].  Patient underwent left renal angiogram and peripheral branch embolization and 3 mm coil placement by interventional radiologist Dr. Miles Costain around 7:44 PM 7/23. -Postembolization patient is hemodynamically stable -Initial presentation CBC showed hemoglobin 14 and repeat H&H after 6 hours showed hemoglobin 12.6.  Continue to monitor H&H every 6 hours.  Will transfuse if hemoglobin drops very quickly on next H&H check. -Hgb/Hct Trend:  Recent Labs  Lab 03/10/23 1359 03/10/23 2029 03/10/23 2202 03/11/23 0400  HGB 14.0 12.4 12.6 12.1  HCT 41.5 38.3 38.5 37.9  MCV 94.5  --   --   --   -Stat type and screen, prepared 2 unit of blood in case patient needs transfusion. -Patient received Kcentra at [facility] per pharmacy -If patient develop any significant abdominal pain or hemoglobin drop low threshold to quickly will do stat CT scan of the abdomen -Continue hydralazine 10 mg every 6 hours as needed for good blood pressure control to prevent any bleeding. -Continue morphine 2 mg every 2 hours as needed for pain -Admitted patient to the Telemetry Unit -Obtain PT/OT to evaluate and Treat -Advance Diet as Tolerated   Paroxysmal  Atrial Fibrillation -At home patient is on Eliquis 5 mg twice daily for last 2 years for proximal afibrillation. -CHA2DS2-VASc score = 3, places patient to high risk of stroke -Resuming oral amiodarone 100 mg  daily. -Bedside cardiac monitoring showed normal sinus rhythm heart rate around 80 -In the setting of intra-abdominal bleeding will be holding Eliquis. -Continue to monitor H&H closely and will resume as appropriate -Discussed with Cardiology Dr. Mayford Knife who recommends resuming Anticoagulation when ok with IR  Metabolic Acidosis -Mild. In the setting of Above -Patient's CO2 on Admission was 18, AG was 12, Chloride Level was 106; Now AG is 16, Chloride Level is 106 and AG is 13 -Continue to Monitor and Trend and repeat in the AM   Hypokalemia -> Hyperkalemia -Patient's K+ Level Trend: Recent Labs  Lab 03/10/23 1359 03/10/23 2200 03/11/23 0400  K 3.4* 5.2* 5.3*  -Was given Repletion and now given a dose of Lokelma 10 grams x1 -Continue to Monitor and Replete as Necessary -Repeat CMP in the AM    Euvolemic Hyponatremia -Na+ Trend: Recent Labs  Lab 03/10/23 1359 03/10/23 2200 03/11/23 0400  NA 134* 136 135  -Likely secondary from activation of antidiuretic hormone in the setting of abdominal pain. -Continue to monitor sodium level and repeat CMP in the AM   History of diverticulosis - Stable    DVT prophylaxis: SCDs Start: 03/10/23 2144    Code Status: Full Code Family Communication: Discussed with Husband at bedside  Disposition Plan:  Level of care: Telemetry Medical Status is: Inpatient Remains inpatient appropriate because: Needs further clinical improvement anticipating discharging next 24 to 48 hours if abdominal pain is improved if hemoglobin is stable.   Consultants:  Interventional radiology  Procedures:  As delineated as above  Antimicrobials:  Anti-infectives (From admission, onward)    None       Subjective: Seen and examined at bedside and she is continue to have some abdominal discomfort but states it is improving since she came in.  No nausea or vomiting now.  Denies any lightheadedness or dizziness.  No other concerns or complaints at this  time.  Objective: Vitals:   03/11/23 1209 03/11/23 1400 03/11/23 1601 03/11/23 1606  BP: 134/72 (!) 142/65  (!) 132/102  Pulse: (!) 106 (!) 113  (!) 103  Resp: 16 (!) 22  18  Temp: 97.6 F (36.4 C)   98 F (36.7 C)  TempSrc: Oral   Oral  SpO2: 100% 100%  94%  Weight:   72.6 kg   Height:   5\' 7"  (1.702 m)    No intake or output data in the 24 hours ending 03/11/23 1652 Filed Weights   03/10/23 1354 03/11/23 1601  Weight: 74.8 kg 72.6 kg   Examination: Physical Exam:  Constitutional: WN/WD slightly overweight Caucasian female in no acute distress Respiratory: Diminished to auscultation bilaterally, no wheezing, rales, rhonchi or crackles. Normal respiratory effort and patient is not tachypenic. No accessory muscle use.  Unlabored breathing Cardiovascular: RRR, no murmurs / rubs / gallops. S1 and S2 auscultated. No extremity edema.  Abdomen: Soft, tender to palpate and slightly distended.. Bowel sounds positive.  GU: Deferred. Musculoskeletal: No clubbing / cyanosis of digits/nails. No joint deformity upper and lower extremities.  Skin: No rashes, lesions, ulcers on limited skin evaluation. No induration; Warm and dry.  Neurologic: CN 2-12 grossly intact with no focal deficits. Romberg sign and cerebellar reflexes not assessed.  Psychiatric: Normal judgment and insight. Alert and oriented x 3. Normal mood  and appropriate affect.   Data Reviewed: I have personally reviewed following labs and imaging studies  CBC: Recent Labs  Lab 03/10/23 1359 03/10/23 2029 03/10/23 2202 03/11/23 0400  WBC 10.9*  --   --   --   HGB 14.0 12.4 12.6 12.1  HCT 41.5 38.3 38.5 37.9  MCV 94.5  --   --   --   PLT 185  --   --   --    Basic Metabolic Panel: Recent Labs  Lab 03/10/23 1359 03/10/23 2200 03/11/23 0400  NA 134* 136 135  K 3.4* 5.2* 5.3*  CL 100 106 106  CO2 21* 18* 16*  GLUCOSE 145* 197* 158*  BUN 27* 30* 34*  CREATININE 0.77 1.17* 0.97  CALCIUM 9.2 8.7* 8.6*    GFR: Estimated Creatinine Clearance: 48 mL/min (by C-G formula based on SCr of 0.97 mg/dL). Liver Function Tests: Recent Labs  Lab 03/10/23 1359 03/10/23 2200 03/11/23 0400  AST 22 21 22   ALT 17 14 14   ALKPHOS 49 43 45  BILITOT 1.1 0.8 0.9  PROT 7.2 6.1* 6.0*  ALBUMIN 4.4 3.8 3.6   Recent Labs  Lab 03/10/23 1359  LIPASE 26   No results for input(s): "AMMONIA" in the last 168 hours. Coagulation Profile: Recent Labs  Lab 03/10/23 1627 03/11/23 0400  INR 1.2 1.2   Cardiac Enzymes: No results for input(s): "CKTOTAL", "CKMB", "CKMBINDEX", "TROPONINI" in the last 168 hours. BNP (last 3 results) No results for input(s): "PROBNP" in the last 8760 hours. HbA1C: No results for input(s): "HGBA1C" in the last 72 hours. CBG: No results for input(s): "GLUCAP" in the last 168 hours. Lipid Profile: No results for input(s): "CHOL", "HDL", "LDLCALC", "TRIG", "CHOLHDL", "LDLDIRECT" in the last 72 hours. Thyroid Function Tests: No results for input(s): "TSH", "T4TOTAL", "FREET4", "T3FREE", "THYROIDAB" in the last 72 hours. Anemia Panel: No results for input(s): "VITAMINB12", "FOLATE", "FERRITIN", "TIBC", "IRON", "RETICCTPCT" in the last 72 hours. Sepsis Labs: No results for input(s): "PROCALCITON", "LATICACIDVEN" in the last 168 hours.  No results found for this or any previous visit (from the past 240 hour(s)).   Radiology Studies: IR US Guide Vasc Access Right  Result Date: 03/11/2023 INDICATION: Acute arterial bleeding from a known large left renal angiomyolipoma. Remote embolization 2018. EXAM: ULTRASOUND GUIDANCE FOR VASCULAR ACCESS LEFT RENAL ANGIOGRAM PERIPHERAL MICRO CATHETERIZATIONS AND ANGIOGRAMS OF 3 ADDITIONAL SEGMENTAL BRANCHES TO THE LEFT KIDNEY UPPER POLE PERIPHERAL EMBOLIZATION OF AN ABNORMAL LEFT INTERPOLE SEGMENTAL BRANCH SUPPLYING THE RENAL ANGIOMYOLIPOMA MEDICATIONS: 1% LIDOCAINE LOCAL. ANESTHESIA/SEDATION: Moderate (conscious) sedation was employed during this  procedure. A total of Versed 2.0 mg and Fentanyl 100 mcg was administered intravenously by the radiology nurse. Total intra-service moderate Sedation Time: 56 minutes. The patient's level of consciousness and vital signs were monitored continuously by radiology nursing throughout the procedure under my direct supervision. CONTRAST:  76 cc omni 300 FLUOROSCOPY: Radiation Exposure Index (as provided by the fluoroscopic device): 1,414 MGy Kerma COMPLICATIONS: None immediate. PROCEDURE: Informed consent was obtained from the patient following explanation of the procedure, risks, benefits and alternatives. The patient understands, agrees and consents for the procedure. All questions were addressed. A time out was performed prior to the initiation of the procedure. Maximal barrier sterile technique utilized including caps, mask, sterile gowns, sterile gloves, large sterile drape, hand hygiene, and Betadine prep. Under sterile conditions and local anesthesia, ultrasound micropuncture access performed of the right common femoral artery. Images obtained for documentation of the patent right common femoral artery. Five  French sheath inserted over a Bentson guidewire. Initially, a C2 catheter was utilized to select the left renal artery. Initial left renal angiogram performed. Left renal artery origin is widely patent. Minor nonocclusive atherosclerotic change of the main renal artery. Peripheral branches are patent. Previous coil embolization noted within the mid to upper pole region. Initial angiogram demonstrates trace abnormal vascularity along the left kidney lateral cortical margin suspicious for an area of bleeding versus tumor vascularity. Catheter exchangeD for a chung 2.5 catheter. This was reformed in the thoracic aorta and retracted to select the left renal artery. Additional left renal angiograms performed in oblique projections. Again, from the mid pole laterally there is abnormal vascularity outside of the renal  parenchymal phase concerning for an area of arterial bleeding when correlated with CTA. Through the chung 2.5 catheter, the Renegade STC catheter was advanced over a fathom 14 guidewire initially into an upper pole segmental branch. Angiogram within this branch demonstrates normal parenchymal staining without tumor vascularity. Microcatheter retracted and utilized to select an additional segmental branch to the upper pole. Angiogram within this second branch demonstrates normal renal vascularity as well. Microcatheter retracted and advanced over a GT Glidewire into mid pole segmental branches. Additional angiogram demonstrates normal renal vascularity. Faint tumor vascularity still visualized laterally. Over the fathom 14 guidewire, eventually the microcatheter was advanced into an interpolar branch close to the existing microcoils. Angiogram within this segmental branch demonstrates enlarged tortuous tumor vascularity with contrast extravasation into the surrounding retroperitoneal space. This correlates with the CTA. From this location, embolization was performed. Embolization: Through the Renegade STC catheter, 3 cc of 300-500 micron embospheres were slowly instilled under intermittent fluoroscopy watching for reflux. Once reflux was visualized, a single 3mm x 4 cm Azur CX detachable microcoil was successfully deployed within the interpolar abnormal segmental branch. After approximately 5 minutes, repeat angiogram performed through the microcatheter within the branch. Following embolization this branch is occluded and the abnormal vascularity to the angiomyolipoma is no longer visualized. Microcatheter removed. Final renal angiogram performed through the 5 French Chung catheter. This confirms preservation of the main renal artery supply to the left kidney. No additional abnormal vascularity visualized. Injection of the right common femoral artery sheath confirms adequate access for closure device. Of note, there  is beaded appearance of the right external iliac artery compatible with nonocclusive fibromuscular dysplasia. Right common femoral, proximal profunda femoral, proximal superficial femoral arteries are all patent. Successful deployment of the CELT device for hemostasis. 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 Electronically Signed   By: Judie Petit.  Shick M.D.   On: 03/11/2023 08:19   IR Angiogram Renal Left Selective  Result Date: 03/11/2023 INDICATION: Acute arterial bleeding from a known large left renal angiomyolipoma. Remote embolization 2018. EXAM: ULTRASOUND GUIDANCE FOR VASCULAR ACCESS LEFT RENAL ANGIOGRAM PERIPHERAL MICRO CATHETERIZATIONS AND ANGIOGRAMS OF 3 ADDITIONAL SEGMENTAL BRANCHES TO THE LEFT KIDNEY UPPER POLE PERIPHERAL EMBOLIZATION OF AN ABNORMAL LEFT INTERPOLE SEGMENTAL BRANCH SUPPLYING THE RENAL ANGIOMYOLIPOMA MEDICATIONS: 1% LIDOCAINE LOCAL. ANESTHESIA/SEDATION: Moderate (conscious) sedation was employed during this procedure. A total of Versed 2.0 mg and Fentanyl 100 mcg was administered intravenously by the radiology nurse. Total intra-service moderate Sedation Time: 56 minutes. The patient's level of consciousness and vital signs were monitored continuously by radiology nursing throughout the procedure under my direct supervision. CONTRAST:  76 cc omni 300 FLUOROSCOPY: Radiation Exposure Index (  as provided by the fluoroscopic device): 1,414 MGy Kerma COMPLICATIONS: None immediate. PROCEDURE: Informed consent was obtained from the patient following explanation of the procedure, risks, benefits and alternatives. The patient understands, agrees and consents for the procedure. All questions were addressed. A time out was performed prior to the initiation of the procedure. Maximal barrier sterile technique  utilized including caps, mask, sterile gowns, sterile gloves, large sterile drape, hand hygiene, and Betadine prep. Under sterile conditions and local anesthesia, ultrasound micropuncture access performed of the right common femoral artery. Images obtained for documentation of the patent right common femoral artery. Five French sheath inserted over a Bentson guidewire. Initially, a C2 catheter was utilized to select the left renal artery. Initial left renal angiogram performed. Left renal artery origin is widely patent. Minor nonocclusive atherosclerotic change of the main renal artery. Peripheral branches are patent. Previous coil embolization noted within the mid to upper pole region. Initial angiogram demonstrates trace abnormal vascularity along the left kidney lateral cortical margin suspicious for an area of bleeding versus tumor vascularity. Catheter exchangeD for a chung 2.5 catheter. This was reformed in the thoracic aorta and retracted to select the left renal artery. Additional left renal angiograms performed in oblique projections. Again, from the mid pole laterally there is abnormal vascularity outside of the renal parenchymal phase concerning for an area of arterial bleeding when correlated with CTA. Through the chung 2.5 catheter, the Renegade STC catheter was advanced over a fathom 14 guidewire initially into an upper pole segmental branch. Angiogram within this branch demonstrates normal parenchymal staining without tumor vascularity. Microcatheter retracted and utilized to select an additional segmental branch to the upper pole. Angiogram within this second branch demonstrates normal renal vascularity as well. Microcatheter retracted and advanced over a GT Glidewire into mid pole segmental branches. Additional angiogram demonstrates normal renal vascularity. Faint tumor vascularity still visualized laterally. Over the fathom 14 guidewire, eventually the microcatheter was advanced into an interpolar  branch close to the existing microcoils. Angiogram within this segmental branch demonstrates enlarged tortuous tumor vascularity with contrast extravasation into the surrounding retroperitoneal space. This correlates with the CTA. From this location, embolization was performed. Embolization: Through the Renegade STC catheter, 3 cc of 300-500 micron embospheres were slowly instilled under intermittent fluoroscopy watching for reflux. Once reflux was visualized, a single 3mm x 4 cm Azur CX detachable microcoil was successfully deployed within the interpolar abnormal segmental branch. After approximately 5 minutes, repeat angiogram performed through the microcatheter within the branch. Following embolization this branch is occluded and the abnormal vascularity to the angiomyolipoma is no longer visualized. Microcatheter removed. Final renal angiogram performed through the 5 French Chung catheter. This confirms preservation of the main renal artery supply to the left kidney. No additional abnormal vascularity visualized. Injection of the right common femoral artery sheath confirms adequate access for closure device. Of note, there is beaded appearance of the right external iliac artery compatible with nonocclusive fibromuscular dysplasia. Right common femoral, proximal profunda femoral, proximal superficial femoral arteries are all patent. Successful deployment of the CELT device for hemostasis. 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 Electronically Signed   By: Judie Petit.  Shick M.D.   On: 03/11/2023 08:19   IR EMBO ART  VEN HEMORR LYMPH EXTRAV  INC GUIDE ROADMAPPING  Result Date: 03/11/2023 INDICATION: Acute arterial bleeding from a known large left renal angiomyolipoma.  Remote embolization 2018. EXAM: ULTRASOUND GUIDANCE FOR  VASCULAR ACCESS LEFT RENAL ANGIOGRAM PERIPHERAL MICRO CATHETERIZATIONS AND ANGIOGRAMS OF 3 ADDITIONAL SEGMENTAL BRANCHES TO THE LEFT KIDNEY UPPER POLE PERIPHERAL EMBOLIZATION OF AN ABNORMAL LEFT INTERPOLE SEGMENTAL BRANCH SUPPLYING THE RENAL ANGIOMYOLIPOMA MEDICATIONS: 1% LIDOCAINE LOCAL. ANESTHESIA/SEDATION: Moderate (conscious) sedation was employed during this procedure. A total of Versed 2.0 mg and Fentanyl 100 mcg was administered intravenously by the radiology nurse. Total intra-service moderate Sedation Time: 56 minutes. The patient's level of consciousness and vital signs were monitored continuously by radiology nursing throughout the procedure under my direct supervision. CONTRAST:  76 cc omni 300 FLUOROSCOPY: Radiation Exposure Index (as provided by the fluoroscopic device): 1,414 MGy Kerma COMPLICATIONS: None immediate. PROCEDURE: Informed consent was obtained from the patient following explanation of the procedure, risks, benefits and alternatives. The patient understands, agrees and consents for the procedure. All questions were addressed. A time out was performed prior to the initiation of the procedure. Maximal barrier sterile technique utilized including caps, mask, sterile gowns, sterile gloves, large sterile drape, hand hygiene, and Betadine prep. Under sterile conditions and local anesthesia, ultrasound micropuncture access performed of the right common femoral artery. Images obtained for documentation of the patent right common femoral artery. Five French sheath inserted over a Bentson guidewire. Initially, a C2 catheter was utilized to select the left renal artery. Initial left renal angiogram performed. Left renal artery origin is widely patent. Minor nonocclusive atherosclerotic change of the main renal artery. Peripheral branches are patent. Previous coil embolization noted within the mid to upper pole region. Initial angiogram demonstrates trace abnormal vascularity along the left kidney  lateral cortical margin suspicious for an area of bleeding versus tumor vascularity. Catheter exchangeD for a chung 2.5 catheter. This was reformed in the thoracic aorta and retracted to select the left renal artery. Additional left renal angiograms performed in oblique projections. Again, from the mid pole laterally there is abnormal vascularity outside of the renal parenchymal phase concerning for an area of arterial bleeding when correlated with CTA. Through the chung 2.5 catheter, the Renegade STC catheter was advanced over a fathom 14 guidewire initially into an upper pole segmental branch. Angiogram within this branch demonstrates normal parenchymal staining without tumor vascularity. Microcatheter retracted and utilized to select an additional segmental branch to the upper pole. Angiogram within this second branch demonstrates normal renal vascularity as well. Microcatheter retracted and advanced over a GT Glidewire into mid pole segmental branches. Additional angiogram demonstrates normal renal vascularity. Faint tumor vascularity still visualized laterally. Over the fathom 14 guidewire, eventually the microcatheter was advanced into an interpolar branch close to the existing microcoils. Angiogram within this segmental branch demonstrates enlarged tortuous tumor vascularity with contrast extravasation into the surrounding retroperitoneal space. This correlates with the CTA. From this location, embolization was performed. Embolization: Through the Renegade STC catheter, 3 cc of 300-500 micron embospheres were slowly instilled under intermittent fluoroscopy watching for reflux. Once reflux was visualized, a single 3mm x 4 cm Azur CX detachable microcoil was successfully deployed within the interpolar abnormal segmental branch. After approximately 5 minutes, repeat angiogram performed through the microcatheter within the branch. Following embolization this branch is occluded and the abnormal vascularity to the  angiomyolipoma is no longer visualized. Microcatheter removed. Final renal angiogram performed through the 5 French Chung catheter. This confirms preservation of the main renal artery supply to the left kidney. No additional abnormal vascularity visualized. Injection of the right common femoral artery sheath confirms adequate access for closure device. Of note,  there is beaded appearance of the right external iliac artery compatible with nonocclusive fibromuscular dysplasia. Right common femoral, proximal profunda femoral, proximal superficial femoral arteries are all patent. Successful deployment of the CELT device for hemostasis. 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 Electronically Signed   By: Judie Petit.  Shick M.D.   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 Remote embolization 2018. EXAM: ULTRASOUND GUIDANCE FOR VASCULAR ACCESS LEFT RENAL ANGIOGRAM PERIPHERAL MICRO CATHETERIZATIONS AND ANGIOGRAMS OF 3 ADDITIONAL SEGMENTAL BRANCHES TO THE LEFT KIDNEY UPPER POLE PERIPHERAL EMBOLIZATION OF AN ABNORMAL LEFT INTERPOLE SEGMENTAL BRANCH SUPPLYING THE RENAL ANGIOMYOLIPOMA MEDICATIONS: 1% LIDOCAINE LOCAL. ANESTHESIA/SEDATION: Moderate (conscious) sedation was employed during this procedure. A total of Versed 2.0 mg and Fentanyl 100 mcg was administered intravenously by the radiology nurse. Total intra-service moderate Sedation Time: 56 minutes. The patient's level of consciousness and vital signs were monitored continuously by radiology nursing throughout the procedure under my direct supervision. CONTRAST:  76 cc omni 300 FLUOROSCOPY: Radiation Exposure Index (as provided by the fluoroscopic  device): 1,414 MGy Kerma COMPLICATIONS: None immediate. PROCEDURE: Informed consent was obtained from the patient following explanation of the procedure, risks, benefits and alternatives. The patient understands, agrees and consents for the procedure. All questions were addressed. A time out was performed prior to the initiation of the procedure. Maximal barrier sterile technique utilized including caps, mask, sterile gowns, sterile gloves, large sterile drape, hand hygiene, and Betadine prep. Under sterile conditions and local anesthesia, ultrasound micropuncture access performed of the right common femoral artery. Images obtained for documentation of the patent right common femoral artery. Five French sheath inserted over a Bentson guidewire. Initially, a C2 catheter was utilized to select the left renal artery. Initial left renal angiogram performed. Left renal artery origin is widely patent. Minor nonocclusive atherosclerotic change of the main renal artery. Peripheral branches are patent. Previous coil embolization noted within the mid to upper pole region. Initial angiogram demonstrates trace abnormal vascularity along the left kidney lateral cortical margin suspicious for an area of bleeding versus tumor vascularity. Catheter exchangeD for a chung 2.5 catheter. This was reformed in the thoracic aorta and retracted to select the left renal artery. Additional left renal angiograms performed in oblique projections. Again, from the mid pole laterally there is abnormal vascularity outside of the renal parenchymal phase concerning for an area of arterial bleeding when correlated with CTA. Through the chung 2.5 catheter, the Renegade STC catheter was advanced over a fathom 14 guidewire initially into an upper pole segmental branch. Angiogram within this branch demonstrates normal parenchymal staining without tumor vascularity. Microcatheter retracted and utilized to select an additional segmental branch to the upper  pole. Angiogram within this second branch demonstrates normal renal vascularity as well. Microcatheter retracted and advanced over a GT Glidewire into mid pole segmental branches. Additional angiogram demonstrates normal renal vascularity. Faint tumor vascularity still visualized laterally. Over the fathom 14 guidewire, eventually the microcatheter was advanced into an interpolar branch close to the existing microcoils. Angiogram within this segmental branch demonstrates enlarged tortuous tumor vascularity with contrast extravasation into the surrounding retroperitoneal space. This correlates with the CTA. From this location, embolization was performed. Embolization: Through the Renegade STC catheter, 3 cc of 300-500 micron embospheres were slowly instilled under intermittent  fluoroscopy watching for reflux. Once reflux was visualized, a single 3mm x 4 cm Azur CX detachable microcoil was successfully deployed within the interpolar abnormal segmental branch. After approximately 5 minutes, repeat angiogram performed through the microcatheter within the branch. Following embolization this branch is occluded and the abnormal vascularity to the angiomyolipoma is no longer visualized. Microcatheter removed. Final renal angiogram performed through the 5 French Chung catheter. This confirms preservation of the main renal artery supply to the left kidney. No additional abnormal vascularity visualized. Injection of the right common femoral artery sheath confirms adequate access for closure device. Of note, there is beaded appearance of the right external iliac artery compatible with nonocclusive fibromuscular dysplasia. Right common femoral, proximal profunda femoral, proximal superficial femoral arteries are all patent. Successful deployment of the CELT device for hemostasis. 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 Electronically Signed   By: Judie Petit.  Shick M.D.   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 Remote embolization 2018. EXAM: ULTRASOUND GUIDANCE FOR VASCULAR ACCESS LEFT RENAL ANGIOGRAM PERIPHERAL MICRO CATHETERIZATIONS AND ANGIOGRAMS OF 3 ADDITIONAL SEGMENTAL BRANCHES TO THE LEFT KIDNEY UPPER POLE PERIPHERAL EMBOLIZATION OF AN ABNORMAL LEFT INTERPOLE SEGMENTAL BRANCH SUPPLYING THE RENAL ANGIOMYOLIPOMA MEDICATIONS: 1% LIDOCAINE LOCAL. ANESTHESIA/SEDATION: Moderate (conscious) sedation was employed during this procedure. A total of Versed 2.0 mg and Fentanyl 100 mcg was administered intravenously by the radiology nurse. Total intra-service moderate Sedation Time: 56 minutes. The patient's level of consciousness and vital signs were monitored continuously by radiology nursing throughout the procedure under my direct supervision. CONTRAST:  76 cc omni 300 FLUOROSCOPY: Radiation Exposure Index (as provided by the fluoroscopic device): 1,414 MGy Kerma COMPLICATIONS: None immediate. PROCEDURE: Informed consent was obtained from the patient following explanation of the procedure, risks, benefits and alternatives. The patient understands, agrees and consents for the procedure. All questions were addressed. A time out was performed prior to the initiation of the procedure. Maximal barrier sterile technique utilized including caps, mask, sterile gowns, sterile gloves, large sterile drape, hand hygiene, and Betadine prep. Under sterile conditions and local anesthesia, ultrasound micropuncture access performed of the right common femoral artery. Images obtained for documentation of the patent right common femoral artery. Five French sheath inserted over a Bentson guidewire. Initially, a C2 catheter was utilized to  select the left renal artery. Initial left renal angiogram performed. Left renal artery origin is widely patent. Minor nonocclusive atherosclerotic change of the main renal artery. Peripheral branches are patent. Previous coil embolization noted within the mid to upper pole region. Initial angiogram demonstrates trace abnormal vascularity along the left kidney lateral cortical margin suspicious for an area of bleeding versus tumor vascularity. Catheter exchangeD for a chung 2.5 catheter. This was reformed in the thoracic aorta and retracted to select the left renal artery. Additional left renal angiograms performed in oblique projections. Again, from the mid pole laterally there is abnormal vascularity outside of the renal parenchymal phase concerning for an area of arterial bleeding when correlated with CTA. Through the chung 2.5 catheter, the Renegade STC catheter was advanced over a fathom 14 guidewire initially into an upper pole segmental branch. Angiogram within this branch demonstrates normal parenchymal staining without tumor vascularity. Microcatheter retracted and utilized to select an additional segmental branch to the  upper pole. Angiogram within this second branch demonstrates normal renal vascularity as well. Microcatheter retracted and advanced over a GT Glidewire into mid pole segmental branches. Additional angiogram demonstrates normal renal vascularity. Faint tumor vascularity still visualized laterally. Over the fathom 14 guidewire, eventually the microcatheter was advanced into an interpolar branch close to the existing microcoils. Angiogram within this segmental branch demonstrates enlarged tortuous tumor vascularity with contrast extravasation into the surrounding retroperitoneal space. This correlates with the CTA. From this location, embolization was performed. Embolization: Through the Renegade STC catheter, 3 cc of 300-500 micron embospheres were slowly instilled under intermittent  fluoroscopy watching for reflux. Once reflux was visualized, a single 3mm x 4 cm Azur CX detachable microcoil was successfully deployed within the interpolar abnormal segmental branch. After approximately 5 minutes, repeat angiogram performed through the microcatheter within the branch. Following embolization this branch is occluded and the abnormal vascularity to the angiomyolipoma is no longer visualized. Microcatheter removed. Final renal angiogram performed through the 5 French Chung catheter. This confirms preservation of the main renal artery supply to the left kidney. No additional abnormal vascularity visualized. Injection of the right common femoral artery sheath confirms adequate access for closure device. Of note, there is beaded appearance of the right external iliac artery compatible with nonocclusive fibromuscular dysplasia. Right common femoral, proximal profunda femoral, proximal superficial femoral arteries are all patent. Successful deployment of the CELT device for hemostasis. 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 Electronically Signed   By: Judie Petit.  Shick M.D.   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 Remote embolization 2018. EXAM: ULTRASOUND GUIDANCE FOR VASCULAR ACCESS LEFT RENAL ANGIOGRAM PERIPHERAL MICRO CATHETERIZATIONS AND ANGIOGRAMS OF 3 ADDITIONAL SEGMENTAL BRANCHES TO THE LEFT KIDNEY UPPER POLE PERIPHERAL EMBOLIZATION OF AN ABNORMAL LEFT INTERPOLE SEGMENTAL BRANCH SUPPLYING THE RENAL ANGIOMYOLIPOMA MEDICATIONS: 1% LIDOCAINE LOCAL. ANESTHESIA/SEDATION: Moderate (conscious) sedation was employed during this procedure. A total of Versed 2.0 mg and Fentanyl 100 mcg  was administered intravenously by the radiology nurse. Total intra-service moderate Sedation Time: 56 minutes. The patient's level of consciousness and vital signs were monitored continuously by radiology nursing throughout the procedure under my direct supervision. CONTRAST:  76 cc omni 300 FLUOROSCOPY: Radiation Exposure Index (as provided by the fluoroscopic device): 1,414 MGy Kerma COMPLICATIONS: None immediate. PROCEDURE: Informed consent was obtained from the patient following explanation of the procedure, risks, benefits and alternatives. The patient understands, agrees and consents for the procedure. All questions were addressed. A time out was performed prior to the initiation of the procedure. Maximal barrier sterile technique utilized including caps, mask, sterile gowns, sterile gloves, large sterile drape, hand hygiene, and Betadine prep. Under sterile conditions and local anesthesia, ultrasound micropuncture access performed of the right common femoral artery. Images obtained for documentation of the patent right common femoral artery. Five French sheath inserted over a Bentson guidewire. Initially, a C2 catheter was utilized to select the left renal artery. Initial left renal angiogram performed. Left renal artery origin is widely patent. Minor nonocclusive atherosclerotic change of the main renal artery. Peripheral branches are patent. Previous coil embolization noted within the mid to upper pole region. Initial angiogram demonstrates trace abnormal vascularity along the left kidney lateral cortical margin suspicious for an area of bleeding versus tumor vascularity.  Catheter exchangeD for a chung 2.5 catheter. This was reformed in the thoracic aorta and retracted to select the left renal artery. Additional left renal angiograms performed in oblique projections. Again, from the mid pole laterally there is abnormal vascularity outside of the renal parenchymal phase concerning for an area of arterial  bleeding when correlated with CTA. Through the chung 2.5 catheter, the Renegade STC catheter was advanced over a fathom 14 guidewire initially into an upper pole segmental branch. Angiogram within this branch demonstrates normal parenchymal staining without tumor vascularity. Microcatheter retracted and utilized to select an additional segmental branch to the upper pole. Angiogram within this second branch demonstrates normal renal vascularity as well. Microcatheter retracted and advanced over a GT Glidewire into mid pole segmental branches. Additional angiogram demonstrates normal renal vascularity. Faint tumor vascularity still visualized laterally. Over the fathom 14 guidewire, eventually the microcatheter was advanced into an interpolar branch close to the existing microcoils. Angiogram within this segmental branch demonstrates enlarged tortuous tumor vascularity with contrast extravasation into the surrounding retroperitoneal space. This correlates with the CTA. From this location, embolization was performed. Embolization: Through the Renegade STC catheter, 3 cc of 300-500 micron embospheres were slowly instilled under intermittent fluoroscopy watching for reflux. Once reflux was visualized, a single 3mm x 4 cm Azur CX detachable microcoil was successfully deployed within the interpolar abnormal segmental branch. After approximately 5 minutes, repeat angiogram performed through the microcatheter within the branch. Following embolization this branch is occluded and the abnormal vascularity to the angiomyolipoma is no longer visualized. Microcatheter removed. Final renal angiogram performed through the 5 French Chung catheter. This confirms preservation of the main renal artery supply to the left kidney. No additional abnormal vascularity visualized. Injection of the right common femoral artery sheath confirms adequate access for closure device. Of note, there is beaded appearance of the right external iliac  artery compatible with nonocclusive fibromuscular dysplasia. Right common femoral, proximal profunda femoral, proximal superficial femoral arteries are all patent. Successful deployment of the CELT device for hemostasis. 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 Electronically Signed   By: Judie Petit.  Shick M.D.   On: 03/11/2023 08:19   CT ABDOMEN PELVIS W CONTRAST  Result Date: 03/10/2023 CLINICAL DATA:  Acute generalized abdominal pain. EXAM: CT ABDOMEN AND PELVIS WITH CONTRAST TECHNIQUE: Multidetector CT imaging of the abdomen and pelvis was performed using the standard protocol following bolus administration of intravenous contrast. RADIATION DOSE REDUCTION: This exam was performed according to the departmental dose-optimization program which includes automated exposure control, adjustment of the mA and/or kV according to patient size and/or use of iterative reconstruction technique. CONTRAST:  OMNIPAQUE IOHEXOL 300 MG/ML  SOLN COMPARISON:  September 15, 2022. FINDINGS: Lower chest: No acute abnormality. Hepatobiliary: No cholelithiasis or biliary dilatation is noted. Multiple hepatic cysts are again noted. Pancreas: Unremarkable. No pancreatic ductal dilatation or surrounding inflammatory changes. Spleen: Normal in size without focal abnormality. Adrenals/Urinary Tract: Adrenal glands are unremarkable. Small nonobstructive right renal calculus is noted. No hydronephrosis. Urinary bladder is unremarkable. The patient is status post embolization of left renal angiomyolipoma according to prior exam. However, there is now interval development of large subcapsular hematoma and associated left perirenal hematoma superior to the left kidney measuring 7.3 x 5.3 cm. There does appear to be active extravasation of contrast  suggesting active hemorrhage. Stomach/Bowel: Stomach is within normal limits.  Appendix appears normal. No evidence of bowel wall thickening, distention, or inflammatory changes. Sigmoid diverticulosis without inflammation. Vascular/Lymphatic: Aortic atherosclerosis. No enlarged abdominal or pelvic lymph nodes. Reproductive: Uterus is unremarkable. Stable right adnexal mass is noted with calcifications, most consistent with fibroid as noted on prior studies. Other: No ascites or hernia is noted. Musculoskeletal: Old T12, L2 and L4 fractures are noted. No acute osseous abnormality is noted. IMPRESSION: Patient is reportedly status post embolization of left renal angiomyolipoma according to prior exam. There is now interval development of large left renal subcapsular hematoma and associated left pararenal hematoma superior to the left kidney, active extravasation of contrast suggesting active hemorrhage. Critical Value/emergent results were called by telephone at the time of interpretation on 03/10/2023 at 4:08 pm to provider Dr. Charm Barges, who verbally acknowledged these results. Sigmoid diverticulosis without inflammation. Aortic Atherosclerosis (ICD10-I70.0). Electronically Signed   By: Lupita Raider M.D.   On: 03/10/2023 16:10    Scheduled Meds:  sodium chloride   Intravenous Once   amiodarone  100 mg Oral Daily   docusate sodium  100 mg Oral BID   sodium chloride flush  3 mL Intravenous Q12H   Continuous Infusions:  sodium chloride      LOS: 1 day   Marguerita Merles, DO [facility] Available via Epic secure chat 7am-7pm After these hours, please refer to coverage provider listed on [email or web address] 03/11/2023, 4:52 PM

## 2023-03-11 NOTE — Hospital Course (Addendum)
Patient is a 77 year old Caucasian female with a past medical history significant for but limited to angiolipoma of the kidney, atrial fibrillation on anticoagulation with Eliquis, diverticulosis, chronic arthritis as well as other comorbidities who presented to the emergency department at [facility] around 2 PM complaining of abdominal pain.  She started to have abdominal pain around noon and is located in the lower abdomen and epigastric area.  She described it as a sharp constant nonradiating and endorsed nausea vomiting.  States that she ate an egg in the morning that she peeled approximately 4 days prior.  Last bowel movement was in the morning.  She saw blood in her urine and states that before her given that she is a lesion tumor in her kidney.  Further workup in the ED was done with a CT scan of the abdomen pelvis which showed a left renal hematoma with active extravasation.  Interventional radiology was consulted and she was given Kcentra for Eliquis reversal and transferred ED to ED to [facility] and underwent a left renal angiogram and peripheral branch embolization and a 3 mm coil placement by Dr. Denny Levy in the evening.  She is being admitted and being observed to ensure that she has no further bleeding and trending her hemoglobin/hematocrit.  PT and OT evaluated and recommending no follow-up.  Anticipate discharging in the next 24 to 48 hours if improved and hemoglobin is stable with improvement in her abdominal pain of her hemoglobin continues to trend down will discuss with IR about repeat CTA and repeat embolization..  Assessment and Plan:  Left subcapsular hematoma s/p embolectomy 03/10/2023 History of left renal angiolipoma s/p embolization 2018 -Patient coming with complaining of right-sided abdominal pain.  Patient is hemodynamically stable. -CT abdomen pelvis showed status post embolization of left renal angiomyolipoma according to prior exam. There is now  interval development of large left renal subcapsular hematoma and associated left pararenal hematoma superior to the left kidney, active extravasation of contrast suggesting active hemorrhage. Critical Value/emergent results were called by telephone at the time of interpretation on 03/10/2023 at 4:08 pm to provider Dr. Charm Barges, who verbally acknowledged these results. -Interventional radiology consulted Dr. Joanne Gavel, I have recommended to keep Kcentra for Eliquis reversal and patient was transfer from ED to ED at [facility].  Patient underwent left renal angiogram and peripheral branch embolization and 3 mm coil placement by interventional radiologist Dr. Miles Costain around 7:44 PM 7/23. -Postembolization patient is hemodynamically stable -Initial presentation CBC showed hemoglobin 14 and repeat H&H after 6 hours showed hemoglobin 12.6.  Continue to monitor H&H every 6 hours.  Will transfuse if hemoglobin drops very quickly on next H&H check. -Hgb/Hct Trend:  Recent Labs  Lab 03/12/23 1444 03/12/23 1445 03/12/23 1901 03/12/23 2309 03/13/23 0529 03/13/23 0742 03/13/23 1230  HGB 7.8* 7.9* 7.4* 7.2* 7.5* 7.9* 7.6*  HCT 23.8* 23.4* 22.1* 21.1* 21.8* 23.4* 22.7*  MCV 95.2  --   --   --  96.9  --   --   -Stat type and screen, prepared 2 unit of blood in case patient needs transfusion. -Patient received Kcentra at [facility] per pharmacy -If patient develop any significant abdominal pain or hemoglobin drop low threshold to quickly will do stat CT scan of the abdomen -Continue hydralazine 10 mg every 6 hours as needed for good blood pressure control to prevent any bleeding. -Continue morphine 2 mg every 2 hours as needed for pain -Admitted patient to the Telemetry Unit -Obtain  PT/OT to evaluate and Treat and recommending no follow up -Advance Diet as Tolerated -Consulted Urology for their opinion recommending no acute urological intervention recommended continue to trend hemoglobin.  If  there is concern for acute bleed they are recommending discussing with IR about repeat imaging with CT and possible repeat embolization.  She need to follow-up with her outpatient urologist and she follows with Dr. Earlene Plater at [facility] -If hemoglobin remains stable or improved tomorrow we will discharge home   Paroxysmal Atrial Fibrillation -At home patient is on Eliquis 5 mg twice daily for last 2 years for proximal afibrillation. -CHA2DS2-VASc score = 3, places patient to high risk of stroke -Resuming oral amiodarone 100 mg daily. -Bedside cardiac monitoring showed normal sinus rhythm heart rate around 80 -In the setting of intra-abdominal bleeding will be holding Eliquis. -Continue to monitor H&H closely and will resume as appropriate -Discussed with Cardiology Dr. Mayford Knife who recommends resuming Anticoagulation when ok with IR but will continue to Hold given Drop in Hgb/Hct   Metabolic Acidosis, improved -Mild. In the setting of Above -Patient's CO2 on Admission was 18, AG was 12, Chloride Level was 106; Now AG is 7, Chloride Level is 101 and AG is 26 -Continue to Monitor and Trend and repeat in the AM   Hypokalemia -> Hyperkalemia, improved -Patient's K+ Level Trend: Recent Labs  Lab 03/10/23 1359 03/10/23 2200 03/11/23 0400 03/11/23 1752 03/12/23 0418 03/12/23 1444 03/13/23 0529  K 3.4* 5.2* 5.3* 3.9 3.6 3.4* 3.4*  -Was given Repletion and now given a dose of Lokelma 10 grams x1 -Continue to Monitor and Replete as Necessary -Repeat CMP in the AM    Euvolemic Hyponatremia -Na+ Trend: Recent Labs  Lab 03/10/23 1359 03/10/23 2200 03/11/23 0400 03/11/23 1752 03/12/23 0418 03/12/23 1444 03/13/23 0529  NA 134* 136 135 135 134* 134* 137  -Likely secondary from activation of antidiuretic hormone in the setting of abdominal pain. -Continue to monitor sodium level and repeat CMP in the AM   History of Diverticulosis -Stable

## 2023-03-12 DIAGNOSIS — I480 Paroxysmal atrial fibrillation: Secondary | ICD-10-CM | POA: Diagnosis not present

## 2023-03-12 DIAGNOSIS — S37012A Minor contusion of left kidney, initial encounter: Secondary | ICD-10-CM | POA: Diagnosis not present

## 2023-03-12 DIAGNOSIS — K5790 Diverticulosis of intestine, part unspecified, without perforation or abscess without bleeding: Secondary | ICD-10-CM | POA: Diagnosis not present

## 2023-03-12 DIAGNOSIS — D1771 Benign lipomatous neoplasm of kidney: Secondary | ICD-10-CM | POA: Diagnosis not present

## 2023-03-12 LAB — COMPREHENSIVE METABOLIC PANEL
ALT: 61 U/L — ABNORMAL HIGH (ref 0–44)
ALT: 55 U/L — ABNORMAL HIGH (ref 0–44)
AST: 55 U/L — ABNORMAL HIGH (ref 15–41)
Albumin: 3.1 g/dL — ABNORMAL LOW (ref 3.5–5.0)
Albumin: 3 g/dL — ABNORMAL LOW (ref 3.5–5.0)
Alkaline Phosphatase: 37 U/L — ABNORMAL LOW (ref 38–126)
Anion gap: 7 (ref 5–15)
BUN: 26 mg/dL — ABNORMAL HIGH (ref 8–23)
BUN: 23 mg/dL (ref 8–23)
CO2: 24 mmol/L (ref 22–32)
CO2: 26 mmol/L (ref 22–32)
Calcium: 8 mg/dL — ABNORMAL LOW (ref 8.9–10.3)
Calcium: 8.4 mg/dL — ABNORMAL LOW (ref 8.9–10.3)
Chloride: 101 mmol/L (ref 98–111)
Chloride: 101 mmol/L (ref 98–111)
Creatinine, Ser: 0.79 mg/dL (ref 0.44–1.00)
Creatinine, Ser: 0.98 mg/dL (ref 0.44–1.00)
GFR, Estimated: >60 mL/min (ref >60)
GFR, Estimated: 60 mL/min — ABNORMAL LOW (ref >60)
Glucose, Bld: 114 mg/dL — ABNORMAL HIGH (ref 70–99)
Glucose, Bld: 122 mg/dL — ABNORMAL HIGH (ref 70–99)
Potassium: 3.6 mmol/L (ref 3.5–5.1)
Potassium: 3.4 mmol/L — ABNORMAL LOW (ref 3.5–5.1)
Sodium: 134 mmol/L — ABNORMAL LOW (ref 135–145)
Sodium: 134 mmol/L — ABNORMAL LOW (ref 135–145)
Total Bilirubin: 1 mg/dL (ref 0.3–1.2)
Total Bilirubin: 0.8 mg/dL (ref 0.3–1.2)
Total Protein: 5.3 g/dL — ABNORMAL LOW (ref 6.5–8.1)

## 2023-03-12 LAB — CBC WITH DIFFERENTIAL/PLATELET
Abs Immature Granulocytes: 0.07 10*3/uL (ref 0.00–0.07)
Abs Immature Granulocytes: 0.07 10*3/uL (ref 0.00–0.07)
Basophils Absolute: 0 10*3/uL (ref 0.0–0.1)
Basophils Absolute: 0 10*3/uL (ref 0.0–0.1)
Basophils Relative: 0 %
Basophils Relative: 0 %
Eosinophils Absolute: 0 10*3/uL (ref 0.0–0.5)
Eosinophils Absolute: 0 10*3/uL (ref 0.0–0.5)
Eosinophils Relative: 0 %
Eosinophils Relative: 0 %
HCT: 23.8 % — ABNORMAL LOW (ref 36.0–46.0)
Hemoglobin: 8.2 g/dL — ABNORMAL LOW (ref 12.0–15.0)
Hemoglobin: 7.8 g/dL — ABNORMAL LOW (ref 12.0–15.0)
Immature Granulocytes: 1 %
Lymphocytes Relative: 8 %
Lymphocytes Relative: 7 %
Lymphs Abs: 1 10*3/uL (ref 0.7–4.0)
Lymphs Abs: 0.8 10*3/uL (ref 0.7–4.0)
MCH: 31.2 pg (ref 26.0–34.0)
MCHC: 34.6 g/dL (ref 30.0–36.0)
MCHC: 32.8 g/dL (ref 30.0–36.0)
MCV: 92.9 fL (ref 80.0–100.0)
MCV: 95.2 fL (ref 80.0–100.0)
Monocytes Absolute: 1.6 10*3/uL — ABNORMAL HIGH (ref 0.1–1.0)
Monocytes Absolute: 1.5 10*3/uL — ABNORMAL HIGH (ref 0.1–1.0)
Monocytes Relative: 13 %
Monocytes Relative: 14 %
Neutro Abs: 9.5 10*3/uL — ABNORMAL HIGH (ref 1.7–7.7)
Neutro Abs: 8.5 10*3/uL — ABNORMAL HIGH (ref 1.7–7.7)
Neutrophils Relative %: 78 %
Neutrophils Relative %: 78 %
Platelets: 116 10*3/uL — ABNORMAL LOW (ref 150–400)
Platelets: 124 10*3/uL — ABNORMAL LOW (ref 150–400)
RBC: 2.5 MIL/uL — ABNORMAL LOW (ref 3.87–5.11)
RDW: 12.6 % (ref 11.5–15.5)
WBC: 12.1 10*3/uL — ABNORMAL HIGH (ref 4.0–10.5)
WBC: 10.9 10*3/uL — ABNORMAL HIGH (ref 4.0–10.5)
nRBC: 0 % (ref 0.0–0.2)
nRBC: 0.2 % (ref 0.0–0.2)

## 2023-03-12 LAB — HEMOGLOBIN AND HEMATOCRIT, BLOOD
HCT: 25.6 % — ABNORMAL LOW (ref 36.0–46.0)
HCT: 23.4 % — ABNORMAL LOW (ref 36.0–46.0)
HCT: 22.1 % — ABNORMAL LOW (ref 36.0–46.0)
HCT: 21.1 % — ABNORMAL LOW (ref 36.0–46.0)
Hemoglobin: 8.7 g/dL — ABNORMAL LOW (ref 12.0–15.0)
Hemoglobin: 7.9 g/dL — ABNORMAL LOW (ref 12.0–15.0)
Hemoglobin: 7.4 g/dL — ABNORMAL LOW (ref 12.0–15.0)
Hemoglobin: 7.2 g/dL — ABNORMAL LOW (ref 12.0–15.0)

## 2023-03-12 LAB — PHOSPHORUS
Phosphorus: 3.2 mg/dL (ref 2.5–4.6)
Phosphorus: 2.2 mg/dL — ABNORMAL LOW (ref 2.5–4.6)

## 2023-03-12 LAB — MAGNESIUM
Magnesium: 2.1 mg/dL (ref 1.7–2.4)
Magnesium: 2.2 mg/dL (ref 1.7–2.4)

## 2023-03-12 MED ORDER — POTASSIUM CHLORIDE CRYS ER 20 MEQ PO TBCR
40.0000 meq | EXTENDED_RELEASE_TABLET | Freq: Two times a day (BID) | ORAL | Status: AC
  Administered 2023-03-12 – 2023-03-13 (×2): 40 meq via ORAL
  Filled 2023-03-12 (×2): qty 2

## 2023-03-12 MED ORDER — SODIUM PHOSPHATES 45 MMOLE/15ML IV SOLN
15.0000 mmol | Freq: Once | INTRAVENOUS | Status: AC
  Administered 2023-03-12: 15 mmol via INTRAVENOUS
  Filled 2023-03-12: qty 5

## 2023-03-12 NOTE — Plan of Care (Signed)

## 2023-03-12 NOTE — Progress Notes (Signed)
PROGRESS NOTE    Ann Vang  WUJ:[id number] DOB: [birth year] DOA: 03/10/2023 PCP: Alysia Penna, MD   Brief Narrative:  Patient is a 77 year old Caucasian female with a past medical history significant for but limited to angiolipoma of the kidney, atrial fibrillation on anticoagulation with Eliquis, diverticulosis, chronic arthritis as well as other comorbidities who presented to the emergency department at [facility] around 2 PM complaining of abdominal pain.  She started to have abdominal pain around noon and is located in the lower abdomen and epigastric area.  She described it as a sharp constant nonradiating and endorsed nausea vomiting.  States that she ate an egg in the morning that she peeled approximately 4 days prior.  Last bowel movement was in the morning.  She saw blood in her urine and states that before her given that she is a lesion tumor in her kidney.  Further workup in the ED was done with a CT scan of the abdomen pelvis which showed a left renal hematoma with active extravasation.  Interventional radiology was consulted and she was given Kcentra for Eliquis reversal and transferred ED to ED to [facility] and underwent a left renal angiogram and peripheral branch embolization and a 3 mm coil placement by Dr. Denny Levy in the evening.  She is being admitted and being observed to ensure that she has no further bleeding and trending her hemoglobin/hematocrit.  PT and OT evaluated and recommending no follow-up.  Anticipate discharging in the next 24 to 48 hours if improved and hemoglobin is stable with improvement in her abdominal pain of her hemoglobin continues to trend down will discuss with IR about repeat CTA and repeat embolization..  Assessment and Plan:  Left subcapsular hematoma s/p embolectomy 03/10/2023 History of left renal angiolipoma s/p embolization 2018 -Patient coming with complaining of right-sided abdominal pain.  Patient is hemodynamically  stable. -CT abdomen pelvis showed status post embolization of left renal angiomyolipoma according to prior exam. There is now interval development of large left renal subcapsular hematoma and associated left pararenal hematoma superior to the left kidney, active extravasation of contrast suggesting active hemorrhage. Critical Value/emergent results were called by telephone at the time of interpretation on 03/10/2023 at 4:08 pm to provider Dr. Charm Barges, who verbally acknowledged these results. -Interventional radiology consulted Dr. Joanne Gavel, I have recommended to keep Kcentra for Eliquis reversal and patient was transfer from ED to ED at [facility].  Patient underwent left renal angiogram and peripheral branch embolization and 3 mm coil placement by interventional radiologist Dr. Miles Costain around 7:44 PM 7/23. -Postembolization patient is hemodynamically stable -Initial presentation CBC showed hemoglobin 14 and repeat H&H after 6 hours showed hemoglobin 12.6.  Continue to monitor H&H every 6 hours.  Will transfuse if hemoglobin drops very quickly on next H&H check. -Hgb/Hct Trend:  Recent Labs  Lab 03/11/23 1619 03/11/23 1752 03/11/23 2159 03/12/23 0418 03/12/23 0726 03/12/23 1444 03/12/23 1445  HGB 10.3* 9.6* 9.1* 8.2* 8.7* 7.8* 7.9*  HCT 29.8* 28.3* 26.7* 23.7* 25.6* 23.8* 23.4*  MCV 96.8 96.3  --  92.9  --  95.2  --   -Stat type and screen, prepared 2 unit of blood in case patient needs transfusion. -Patient received Kcentra at [facility] per pharmacy -If patient develop any significant abdominal pain or hemoglobin drop low threshold to quickly will do stat CT scan of the abdomen -Continue hydralazine 10 mg every 6 hours as needed for good blood pressure control to prevent any  bleeding. -Continue morphine 2 mg every 2 hours as needed for pain -Admitted patient to the Telemetry Unit -Obtain PT/OT to evaluate and Treat and recommending no follow up -Advance Diet as  Tolerated -Consulted Urology for their opinion recommending no acute urological intervention recommended continue to trend hemoglobin.  If there is concern for acute bleed they are recommending discussing with IR about repeat imaging with CT and possible repeat embolization.  She need to follow-up with her outpatient urologist and she follows with Dr. Earlene Plater at [facility] -If hemoglobin remains stable or improved tomorrow we will discharge home   Paroxysmal Atrial Fibrillation -At home patient is on Eliquis 5 mg twice daily for last 2 years for proximal afibrillation. -CHA2DS2-VASc score = 3, places patient to high risk of stroke -Resuming oral amiodarone 100 mg daily. -Bedside cardiac monitoring showed normal sinus rhythm heart rate around 80 -In the setting of intra-abdominal bleeding will be holding Eliquis. -Continue to monitor H&H closely and will resume as appropriate -Discussed with Cardiology Dr. Mayford Knife who recommends resuming Anticoagulation when ok with IR but will continue to Hold given Drop in Hgb/Hct   Metabolic Acidosis, improved -Mild. In the setting of Above -Patient's CO2 on Admission was 18, AG was 12, Chloride Level was 106; Now AG is 7, Chloride Level is 101 and AG is 26 -Continue to Monitor and Trend and repeat in the AM   Hypokalemia -> Hyperkalemia, improved -Patient's K+ Level Trend: Recent Labs  Lab 03/10/23 1359 03/10/23 2200 03/11/23 0400 03/11/23 1752 03/12/23 0418 03/12/23 1444  K 3.4* 5.2* 5.3* 3.9 3.6 3.4*  -Was given Repletion and now given a dose of Lokelma 10 grams x1 -Continue to Monitor and Replete as Necessary -Repeat CMP in the AM    Euvolemic Hyponatremia -Na+ Trend: Recent Labs  Lab 03/10/23 1359 03/10/23 2200 03/11/23 0400 03/11/23 1752 03/12/23 0418 03/12/23 1444  NA 134* 136 135 135 134* 134*  -Likely secondary from activation of antidiuretic hormone in the setting of abdominal pain. -Continue to monitor sodium level and repeat CMP  in the AM   History of Diverticulosis -Stable    DVT prophylaxis: SCDs Start: 03/10/23 2144    Code Status: Full Code Family Communication: Discussed with husband at bedside  Disposition Plan:  Level of care: Telemetry Medical Status is: Inpatient Remains inpatient appropriate because: Needs to have clinical stability of Hgb/Hct prior to D/C   Consultants:  Interventional Radiology Urology  Procedures:  As delineated as above  Antimicrobials:  Anti-infectives (From admission, onward)    None       Subjective: Seen and examined at bedside and states that her bowel movements are back to normal.  Continues to have some abdominal discomfort.  No chest pain or shortness breath.  Feels okay.  No other concerns or complaints at this time and states that she has been ambulating fairly well.  Objective: Vitals:   03/11/23 2114 03/12/23 0457 03/12/23 0838 03/12/23 1636  BP: (!) 164/75 (!) 147/68 135/64 (!) 145/55  Pulse: 100 87 83 84  Resp: 16 16 18 18   Temp: 98.1 F (36.7 C) 98.3 F (36.8 C) 98.4 F (36.9 C) 98.1 F (36.7 C)  TempSrc: Oral Oral Oral Oral  SpO2: 99% 96% 99% 99%  Weight:      Height:        Intake/Output Summary (Last 24 hours) at 03/12/2023 1945 Last data filed at 03/12/2023 1719 Gross per 24 hour  Intake 520 ml  Output 0 ml  Net 520  ml   Filed Weights   03/10/23 1354 03/11/23 1601  Weight: 74.8 kg 72.6 kg   Examination: Physical Exam:  Constitutional: WN/WD mildly overweight Caucasian female in no acute distress appears calm Respiratory: Diminished to auscultation bilaterally, no wheezing, rales, rhonchi or crackles. Normal respiratory effort and patient is not tachypenic. No accessory muscle use.  Cardiovascular: RRR, no murmurs / rubs / gallops. S1 and S2 auscultated. No extremity edema.  Abdomen: Soft, somewhat tender to palpate.,  Slightly distended secondary body habitus.  No appreciable hepatosplenomegaly. Bowel sounds positive.  GU:  Deferred. Musculoskeletal: No clubbing / cyanosis of digits/nails. No joint deformity upper and lower extremities.  Skin: No rashes, lesions, ulcers on limited skin evaluation. No induration; Warm and dry.  Neurologic: CN 2-12 grossly intact with no focal deficits. Romberg sign and cerebellar reflexes not assessed.  Psychiatric: Normal judgment and insight. Alert and oriented x 3. Normal mood and appropriate affect.   Data Reviewed: I have personally reviewed following labs and imaging studies  CBC: Recent Labs  Lab 03/10/23 1359 03/10/23 2029 03/11/23 1619 03/11/23 1752 03/11/23 2159 03/12/23 0418 03/12/23 0726 03/12/23 1444 03/12/23 1445  WBC 10.9*  --  13.9* 13.2*  --  12.1*  --  10.9*  --   NEUTROABS  --   --  11.2* 10.7*  --  9.5*  --  8.5*  --   HGB 14.0   < > 10.3* 9.6* 9.1* 8.2* 8.7* 7.8* 7.9*  HCT 41.5   < > 29.8* 28.3* 26.7* 23.7* 25.6* 23.8* 23.4*  MCV 94.5  --  96.8 96.3  --  92.9  --  95.2  --   PLT 185  --  154 142*  --  116*  --  124*  --    < > = values in this interval not displayed.   Basic Metabolic Panel: Recent Labs  Lab 03/10/23 2200 03/11/23 0400 03/11/23 1752 03/12/23 0418 03/12/23 1444  NA 136 135 135 134* 134*  K 5.2* 5.3* 3.9 3.6 3.4*  CL 106 106 103 101 101  CO2 18* 16* 22 24 26   GLUCOSE 197* 158* 157* 114* 122*  BUN 30* 34* 34* 26* 23  CREATININE 1.17* 0.97 1.28* 0.79 0.98  CALCIUM 8.7* 8.6* 8.4* 8.0* 8.4*  MG  --   --  2.1 2.1 2.2  PHOS  --   --  3.9 3.2 2.2*   GFR: Estimated Creatinine Clearance: 47.5 mL/min (by C-G formula based on SCr of 0.98 mg/dL). Liver Function Tests: Recent Labs  Lab 03/10/23 2200 03/11/23 0400 03/11/23 1752 03/12/23 0418 03/12/23 1444  AST 21 22 82* 73* 55*  ALT 14 14 60* 61* 55*  ALKPHOS 43 45 41 36* 37*  BILITOT 0.8 0.9 0.5 1.0 0.8  PROT 6.1* 6.0* 5.9* 5.3* 5.3*  ALBUMIN 3.8 3.6 3.5 3.1* 3.0*   Recent Labs  Lab 03/10/23 1359  LIPASE 26   No results for input(s): "AMMONIA" in the last 168  hours. Coagulation Profile: Recent Labs  Lab 03/10/23 1627 03/11/23 0400  INR 1.2 1.2   Cardiac Enzymes: No results for input(s): "CKTOTAL", "CKMB", "CKMBINDEX", "TROPONINI" in the last 168 hours. BNP (last 3 results) No results for input(s): "PROBNP" in the last 8760 hours. HbA1C: No results for input(s): "HGBA1C" in the last 72 hours. CBG: No results for input(s): "GLUCAP" in the last 168 hours. Lipid Profile: No results for input(s): "CHOL", "HDL", "LDLCALC", "TRIG", "CHOLHDL", "LDLDIRECT" in the last 72 hours. Thyroid Function Tests: No results  for input(s): "TSH", "T4TOTAL", "FREET4", "T3FREE", "THYROIDAB" in the last 72 hours. Anemia Panel: No results for input(s): "VITAMINB12", "FOLATE", "FERRITIN", "TIBC", "IRON", "RETICCTPCT" in the last 72 hours. Sepsis Labs: No results for input(s): "PROCALCITON", "LATICACIDVEN" in the last 168 hours.  No results found for this or any previous visit (from the past 240 hour(s)).   Radiology Studies: IR US Guide Vasc Access Right  Result Date: 03/11/2023 INDICATION: Acute arterial bleeding from a known large left renal angiomyolipoma. Remote embolization 2018. EXAM: ULTRASOUND GUIDANCE FOR VASCULAR ACCESS LEFT RENAL ANGIOGRAM PERIPHERAL MICRO CATHETERIZATIONS AND ANGIOGRAMS OF 3 ADDITIONAL SEGMENTAL BRANCHES TO THE LEFT KIDNEY UPPER POLE PERIPHERAL EMBOLIZATION OF AN ABNORMAL LEFT INTERPOLE SEGMENTAL BRANCH SUPPLYING THE RENAL ANGIOMYOLIPOMA MEDICATIONS: 1% LIDOCAINE LOCAL. ANESTHESIA/SEDATION: Moderate (conscious) sedation was employed during this procedure. A total of Versed 2.0 mg and Fentanyl 100 mcg was administered intravenously by the radiology nurse. Total intra-service moderate Sedation Time: 56 minutes. The patient's level of consciousness and vital signs were monitored continuously by radiology nursing throughout the procedure under my direct supervision. CONTRAST:  76 cc omni 300 FLUOROSCOPY: Radiation Exposure Index (as provided by  the fluoroscopic device): 1,414 MGy Kerma COMPLICATIONS: None immediate. PROCEDURE: Informed consent was obtained from the patient following explanation of the procedure, risks, benefits and alternatives. The patient understands, agrees and consents for the procedure. All questions were addressed. A time out was performed prior to the initiation of the procedure. Maximal barrier sterile technique utilized including caps, mask, sterile gowns, sterile gloves, large sterile drape, hand hygiene, and Betadine prep. Under sterile conditions and local anesthesia, ultrasound micropuncture access performed of the right common femoral artery. Images obtained for documentation of the patent right common femoral artery. Five French sheath inserted over a Bentson guidewire. Initially, a C2 catheter was utilized to select the left renal artery. Initial left renal angiogram performed. Left renal artery origin is widely patent. Minor nonocclusive atherosclerotic change of the main renal artery. Peripheral branches are patent. Previous coil embolization noted within the mid to upper pole region. Initial angiogram demonstrates trace abnormal vascularity along the left kidney lateral cortical margin suspicious for an area of bleeding versus tumor vascularity. Catheter exchangeD for a chung 2.5 catheter. This was reformed in the thoracic aorta and retracted to select the left renal artery. Additional left renal angiograms performed in oblique projections. Again, from the mid pole laterally there is abnormal vascularity outside of the renal parenchymal phase concerning for an area of arterial bleeding when correlated with CTA. Through the chung 2.5 catheter, the Renegade STC catheter was advanced over a fathom 14 guidewire initially into an upper pole segmental branch. Angiogram within this branch demonstrates normal parenchymal staining without tumor vascularity. Microcatheter retracted and utilized to select an additional segmental  branch to the upper pole. Angiogram within this second branch demonstrates normal renal vascularity as well. Microcatheter retracted and advanced over a GT Glidewire into mid pole segmental branches. Additional angiogram demonstrates normal renal vascularity. Faint tumor vascularity still visualized laterally. Over the fathom 14 guidewire, eventually the microcatheter was advanced into an interpolar branch close to the existing microcoils. Angiogram within this segmental branch demonstrates enlarged tortuous tumor vascularity with contrast extravasation into the surrounding retroperitoneal space. This correlates with the CTA. From this location, embolization was performed. Embolization: Through the Renegade STC catheter, 3 cc of 300-500 micron embospheres were slowly instilled under intermittent fluoroscopy watching for reflux. Once reflux was visualized, a single 3mm x 4 cm Azur CX detachable microcoil was successfully deployed within  the interpolar abnormal segmental branch. After approximately 5 minutes, repeat angiogram performed through the microcatheter within the branch. Following embolization this branch is occluded and the abnormal vascularity to the angiomyolipoma is no longer visualized. Microcatheter removed. Final renal angiogram performed through the 5 French Chung catheter. This confirms preservation of the main renal artery supply to the left kidney. No additional abnormal vascularity visualized. Injection of the right common femoral artery sheath confirms adequate access for closure device. Of note, there is beaded appearance of the right external iliac artery compatible with nonocclusive fibromuscular dysplasia. Right common femoral, proximal profunda femoral, proximal superficial femoral arteries are all patent. Successful deployment of the CELT device for hemostasis. IMPRESSION: Successful left renal angiography with micro catheterization and coil embolization of a left renal interpolar segmental  branch supplying tumor vascularity to the known renal angiomyolipoma with evidence of active bleeding. Electronically Signed   By: Judie Petit.  Shick M.D.   On: 03/11/2023 08:19   IR Angiogram Renal Left Selective  Result Date: 03/11/2023 INDICATION: Acute arterial bleeding from a known large left renal angiomyolipoma. Remote embolization 2018. EXAM: ULTRASOUND GUIDANCE FOR VASCULAR ACCESS LEFT RENAL ANGIOGRAM PERIPHERAL MICRO CATHETERIZATIONS AND ANGIOGRAMS OF 3 ADDITIONAL SEGMENTAL BRANCHES TO THE LEFT KIDNEY UPPER POLE PERIPHERAL EMBOLIZATION OF AN ABNORMAL LEFT INTERPOLE SEGMENTAL BRANCH SUPPLYING THE RENAL ANGIOMYOLIPOMA MEDICATIONS: 1% LIDOCAINE LOCAL. ANESTHESIA/SEDATION: Moderate (conscious) sedation was employed during this procedure. A total of Versed 2.0 mg and Fentanyl 100 mcg was administered intravenously by the radiology nurse. Total intra-service moderate Sedation Time: 56 minutes. The patient's level of consciousness and vital signs were monitored continuously by radiology nursing throughout the procedure under my direct supervision. CONTRAST:  76 cc omni 300 FLUOROSCOPY: Radiation Exposure Index (as provided by the fluoroscopic device): 1,414 MGy Kerma COMPLICATIONS: None immediate. PROCEDURE: Informed consent was obtained from the patient following explanation of the procedure, risks, benefits and alternatives. The patient understands, agrees and consents for the procedure. All questions were addressed. A time out was performed prior to the initiation of the procedure. Maximal barrier sterile technique utilized including caps, mask, sterile gowns, sterile gloves, large sterile drape, hand hygiene, and Betadine prep. Under sterile conditions and local anesthesia, ultrasound micropuncture access performed of the right common femoral artery. Images obtained for documentation of the patent right common femoral artery. Five French sheath inserted over a Bentson guidewire. Initially, a C2 catheter was  utilized to select the left renal artery. Initial left renal angiogram performed. Left renal artery origin is widely patent. Minor nonocclusive atherosclerotic change of the main renal artery. Peripheral branches are patent. Previous coil embolization noted within the mid to upper pole region. Initial angiogram demonstrates trace abnormal vascularity along the left kidney lateral cortical margin suspicious for an area of bleeding versus tumor vascularity. Catheter exchangeD for a chung 2.5 catheter. This was reformed in the thoracic aorta and retracted to select the left renal artery. Additional left renal angiograms performed in oblique projections. Again, from the mid pole laterally there is abnormal vascularity outside of the renal parenchymal phase concerning for an area of arterial bleeding when correlated with CTA. Through the chung 2.5 catheter, the Renegade STC catheter was advanced over a fathom 14 guidewire initially into an upper pole segmental branch. Angiogram within this branch demonstrates normal parenchymal staining without tumor vascularity. Microcatheter retracted and utilized to select an additional segmental branch to the upper pole. Angiogram within this second branch demonstrates normal renal vascularity as well. Microcatheter retracted and advanced over a GT Glidewire into mid  pole segmental branches. Additional angiogram demonstrates normal renal vascularity. Faint tumor vascularity still visualized laterally. Over the fathom 14 guidewire, eventually the microcatheter was advanced into an interpolar branch close to the existing microcoils. Angiogram within this segmental branch demonstrates enlarged tortuous tumor vascularity with contrast extravasation into the surrounding retroperitoneal space. This correlates with the CTA. From this location, embolization was performed. Embolization: Through the Renegade STC catheter, 3 cc of 300-500 micron embospheres were slowly instilled under  intermittent fluoroscopy watching for reflux. Once reflux was visualized, a single 3mm x 4 cm Azur CX detachable microcoil was successfully deployed within the interpolar abnormal segmental branch. After approximately 5 minutes, repeat angiogram performed through the microcatheter within the branch. Following embolization this branch is occluded and the abnormal vascularity to the angiomyolipoma is no longer visualized. Microcatheter removed. Final renal angiogram performed through the 5 French Chung catheter. This confirms preservation of the main renal artery supply to the left kidney. No additional abnormal vascularity visualized. Injection of the right common femoral artery sheath confirms adequate access for closure device. Of note, there is beaded appearance of the right external iliac artery compatible with nonocclusive fibromuscular dysplasia. Right common femoral, proximal profunda femoral, proximal superficial femoral arteries are all patent. Successful deployment of the CELT device for hemostasis. 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 Electronically Signed   By: Judie Petit.  Shick M.D.   On: 03/11/2023 08:19   IR EMBO ART  VEN HEMORR LYMPH EXTRAV  INC GUIDE ROADMAPPING  Result Date: 03/11/2023 INDICATION: Acute arterial bleeding from a known large left renal angiomyolipoma. Remote embolization 2018. EXAM: ULTRASOUND GUIDANCE FOR VASCULAR ACCESS LEFT RENAL ANGIOGRAM PERIPHERAL MICRO CATHETERIZATIONS AND ANGIOGRAMS OF 3 ADDITIONAL SEGMENTAL BRANCHES TO THE LEFT KIDNEY UPPER POLE PERIPHERAL EMBOLIZATION OF AN ABNORMAL LEFT INTERPOLE SEGMENTAL BRANCH SUPPLYING THE RENAL ANGIOMYOLIPOMA MEDICATIONS: 1% LIDOCAINE LOCAL. ANESTHESIA/SEDATION: Moderate (conscious) sedation was employed during this procedure. A total of Versed  2.0 mg and Fentanyl 100 mcg was administered intravenously by the radiology nurse. Total intra-service moderate Sedation Time: 56 minutes. The patient's level of consciousness and vital signs were monitored continuously by radiology nursing throughout the procedure under my direct supervision. CONTRAST:  76 cc omni 300 FLUOROSCOPY: Radiation Exposure Index (as provided by the fluoroscopic device): 1,414 MGy Kerma COMPLICATIONS: None immediate. PROCEDURE: Informed consent was obtained from the patient following explanation of the procedure, risks, benefits and alternatives. The patient understands, agrees and consents for the procedure. All questions were addressed. A time out was performed prior to the initiation of the procedure. Maximal barrier sterile technique utilized including caps, mask, sterile gowns, sterile gloves, large sterile drape, hand hygiene, and Betadine prep. Under sterile conditions and local anesthesia, ultrasound micropuncture access performed of the right common femoral artery. Images obtained for documentation of the patent right common femoral artery. Five French sheath inserted over a Bentson guidewire. Initially, a C2 catheter was utilized to select the left renal artery. Initial left renal angiogram performed. Left renal artery origin is widely patent. Minor nonocclusive atherosclerotic change of the main renal artery. Peripheral branches are patent. Previous coil embolization noted within the mid to upper pole region. Initial angiogram demonstrates trace abnormal vascularity along the left kidney lateral cortical margin suspicious for an area of bleeding versus tumor vascularity. Catheter exchangeD for a chung 2.5 catheter. This was reformed in the thoracic aorta and retracted to  select the left renal artery. Additional left renal angiograms performed in oblique projections. Again, from the mid pole laterally there is abnormal vascularity outside of the renal parenchymal phase concerning  for an area of arterial bleeding when correlated with CTA. Through the chung 2.5 catheter, the Renegade STC catheter was advanced over a fathom 14 guidewire initially into an upper pole segmental branch. Angiogram within this branch demonstrates normal parenchymal staining without tumor vascularity. Microcatheter retracted and utilized to select an additional segmental branch to the upper pole. Angiogram within this second branch demonstrates normal renal vascularity as well. Microcatheter retracted and advanced over a GT Glidewire into mid pole segmental branches. Additional angiogram demonstrates normal renal vascularity. Faint tumor vascularity still visualized laterally. Over the fathom 14 guidewire, eventually the microcatheter was advanced into an interpolar branch close to the existing microcoils. Angiogram within this segmental branch demonstrates enlarged tortuous tumor vascularity with contrast extravasation into the surrounding retroperitoneal space. This correlates with the CTA. From this location, embolization was performed. Embolization: Through the Renegade STC catheter, 3 cc of 300-500 micron embospheres were slowly instilled under intermittent fluoroscopy watching for reflux. Once reflux was visualized, a single 3mm x 4 cm Azur CX detachable microcoil was successfully deployed within the interpolar abnormal segmental branch. After approximately 5 minutes, repeat angiogram performed through the microcatheter within the branch. Following embolization this branch is occluded and the abnormal vascularity to the angiomyolipoma is no longer visualized. Microcatheter removed. Final renal angiogram performed through the 5 French Chung catheter. This confirms preservation of the main renal artery supply to the left kidney. No additional abnormal vascularity visualized. Injection of the right common femoral artery sheath confirms adequate access for closure device. Of note, there is beaded appearance of the  right external iliac artery compatible with nonocclusive fibromuscular dysplasia. Right common femoral, proximal profunda femoral, proximal superficial femoral arteries are all patent. Successful deployment of the CELT device for hemostasis. 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 Electronically Signed   By: Judie Petit.  Shick M.D.   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 Remote embolization 2018. EXAM: ULTRASOUND GUIDANCE FOR VASCULAR ACCESS LEFT RENAL ANGIOGRAM PERIPHERAL MICRO CATHETERIZATIONS AND ANGIOGRAMS OF 3 ADDITIONAL SEGMENTAL BRANCHES TO THE LEFT KIDNEY UPPER POLE PERIPHERAL EMBOLIZATION OF AN ABNORMAL LEFT INTERPOLE SEGMENTAL BRANCH SUPPLYING THE RENAL ANGIOMYOLIPOMA MEDICATIONS: 1% LIDOCAINE LOCAL. ANESTHESIA/SEDATION: Moderate (conscious) sedation was employed during this procedure. A total of Versed 2.0 mg and Fentanyl 100 mcg was administered intravenously by the radiology nurse. Total intra-service moderate Sedation Time: 56 minutes. The patient's level of consciousness and vital signs were monitored continuously by radiology nursing throughout the procedure under my direct supervision. CONTRAST:  76 cc omni 300 FLUOROSCOPY: Radiation Exposure Index (as provided by the fluoroscopic device): 1,414 MGy Kerma COMPLICATIONS: None immediate. PROCEDURE: Informed consent was obtained from the patient following explanation of the procedure, risks, benefits and alternatives. The patient understands, agrees and consents for the procedure. All questions were addressed. A time out was performed prior to the initiation of the procedure. Maximal barrier sterile technique utilized including  caps, mask, sterile gowns, sterile gloves, large sterile drape, hand hygiene, and Betadine prep. Under sterile conditions and local anesthesia, ultrasound micropuncture access performed of the right common femoral artery. Images obtained  for documentation of the patent right common femoral artery. Five French sheath inserted over a Bentson guidewire. Initially, a C2 catheter was utilized to select the left renal artery. Initial left renal angiogram performed. Left renal artery origin is widely patent. Minor nonocclusive atherosclerotic change of the main renal artery. Peripheral branches are patent. Previous coil embolization noted within the mid to upper pole region. Initial angiogram demonstrates trace abnormal vascularity along the left kidney lateral cortical margin suspicious for an area of bleeding versus tumor vascularity. Catheter exchangeD for a chung 2.5 catheter. This was reformed in the thoracic aorta and retracted to select the left renal artery. Additional left renal angiograms performed in oblique projections. Again, from the mid pole laterally there is abnormal vascularity outside of the renal parenchymal phase concerning for an area of arterial bleeding when correlated with CTA. Through the chung 2.5 catheter, the Renegade STC catheter was advanced over a fathom 14 guidewire initially into an upper pole segmental branch. Angiogram within this branch demonstrates normal parenchymal staining without tumor vascularity. Microcatheter retracted and utilized to select an additional segmental branch to the upper pole. Angiogram within this second branch demonstrates normal renal vascularity as well. Microcatheter retracted and advanced over a GT Glidewire into mid pole segmental branches. Additional angiogram demonstrates normal renal vascularity. Faint tumor vascularity still visualized laterally. Over the fathom 14 guidewire, eventually the microcatheter was advanced into an interpolar branch close to  the existing microcoils. Angiogram within this segmental branch demonstrates enlarged tortuous tumor vascularity with contrast extravasation into the surrounding retroperitoneal space. This correlates with the CTA. From this location, embolization was performed. Embolization: Through the Renegade STC catheter, 3 cc of 300-500 micron embospheres were slowly instilled under intermittent fluoroscopy watching for reflux. Once reflux was visualized, a single 3mm x 4 cm Azur CX detachable microcoil was successfully deployed within the interpolar abnormal segmental branch. After approximately 5 minutes, repeat angiogram performed through the microcatheter within the branch. Following embolization this branch is occluded and the abnormal vascularity to the angiomyolipoma is no longer visualized. Microcatheter removed. Final renal angiogram performed through the 5 French Chung catheter. This confirms preservation of the main renal artery supply to the left kidney. No additional abnormal vascularity visualized. Injection of the right common femoral artery sheath confirms adequate access for closure device. Of note, there is beaded appearance of the right external iliac artery compatible with nonocclusive fibromuscular dysplasia. Right common femoral, proximal profunda femoral, proximal superficial femoral arteries are all patent. Successful deployment of the CELT device for hemostasis. 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 Electronically Signed   By: Judie Petit.  Shick M.D.   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 Remote embolization 2018. EXAM: ULTRASOUND GUIDANCE FOR VASCULAR ACCESS LEFT RENAL  ANGIOGRAM PERIPHERAL MICRO CATHETERIZATIONS AND ANGIOGRAMS OF 3 ADDITIONAL SEGMENTAL BRANCHES TO THE LEFT KIDNEY UPPER POLE PERIPHERAL EMBOLIZATION OF AN ABNORMAL LEFT INTERPOLE SEGMENTAL BRANCH SUPPLYING THE RENAL ANGIOMYOLIPOMA MEDICATIONS: 1% LIDOCAINE LOCAL. ANESTHESIA/SEDATION: Moderate (conscious) sedation was employed during this procedure. A total of Versed 2.0 mg and Fentanyl 100 mcg was administered intravenously by the radiology nurse. Total intra-service moderate Sedation Time: 56 minutes. The patient's level of consciousness and vital signs were monitored continuously by radiology nursing throughout the procedure under my direct  supervision. CONTRAST:  76 cc omni 300 FLUOROSCOPY: Radiation Exposure Index (as provided by the fluoroscopic device): 1,414 MGy Kerma COMPLICATIONS: None immediate. PROCEDURE: Informed consent was obtained from the patient following explanation of the procedure, risks, benefits and alternatives. The patient understands, agrees and consents for the procedure. All questions were addressed. A time out was performed prior to the initiation of the procedure. Maximal barrier sterile technique utilized including caps, mask, sterile gowns, sterile gloves, large sterile drape, hand hygiene, and Betadine prep. Under sterile conditions and local anesthesia, ultrasound micropuncture access performed of the right common femoral artery. Images obtained for documentation of the patent right common femoral artery. Five French sheath inserted over a Bentson guidewire. Initially, a C2 catheter was utilized to select the left renal artery. Initial left renal angiogram performed. Left renal artery origin is widely patent. Minor nonocclusive atherosclerotic change of the main renal artery. Peripheral branches are patent. Previous coil embolization noted within the mid to upper pole region. Initial angiogram demonstrates trace abnormal vascularity along the left kidney lateral cortical margin  suspicious for an area of bleeding versus tumor vascularity. Catheter exchangeD for a chung 2.5 catheter. This was reformed in the thoracic aorta and retracted to select the left renal artery. Additional left renal angiograms performed in oblique projections. Again, from the mid pole laterally there is abnormal vascularity outside of the renal parenchymal phase concerning for an area of arterial bleeding when correlated with CTA. Through the chung 2.5 catheter, the Renegade STC catheter was advanced over a fathom 14 guidewire initially into an upper pole segmental branch. Angiogram within this branch demonstrates normal parenchymal staining without tumor vascularity. Microcatheter retracted and utilized to select an additional segmental branch to the upper pole. Angiogram within this second branch demonstrates normal renal vascularity as well. Microcatheter retracted and advanced over a GT Glidewire into mid pole segmental branches. Additional angiogram demonstrates normal renal vascularity. Faint tumor vascularity still visualized laterally. Over the fathom 14 guidewire, eventually the microcatheter was advanced into an interpolar branch close to the existing microcoils. Angiogram within this segmental branch demonstrates enlarged tortuous tumor vascularity with contrast extravasation into the surrounding retroperitoneal space. This correlates with the CTA. From this location, embolization was performed. Embolization: Through the Renegade STC catheter, 3 cc of 300-500 micron embospheres were slowly instilled under intermittent fluoroscopy watching for reflux. Once reflux was visualized, a single 3mm x 4 cm Azur CX detachable microcoil was successfully deployed within the interpolar abnormal segmental branch. After approximately 5 minutes, repeat angiogram performed through the microcatheter within the branch. Following embolization this branch is occluded and the abnormal vascularity to the angiomyolipoma is no  longer visualized. Microcatheter removed. Final renal angiogram performed through the 5 French Chung catheter. This confirms preservation of the main renal artery supply to the left kidney. No additional abnormal vascularity visualized. Injection of the right common femoral artery sheath confirms adequate access for closure device. Of note, there is beaded appearance of the right external iliac artery compatible with nonocclusive fibromuscular dysplasia. Right common femoral, proximal profunda femoral, proximal superficial femoral arteries are all patent. Successful deployment of the CELT device for hemostasis. 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 Electronically Signed   By: Judie Petit.  Shick M.D.   On: 03/11/2023 08:19   IR Angiogram Selective Each Additional Vessel  Result Date: 03/11/2023 INDICATION: Acute arterial bleeding from a  known large left renal angiomyolipoma. Remote embolization 2018. EXAM: ULTRASOUND GUIDANCE FOR VASCULAR ACCESS LEFT RENAL ANGIOGRAM PERIPHERAL MICRO CATHETERIZATIONS AND ANGIOGRAMS OF 3 ADDITIONAL SEGMENTAL BRANCHES TO THE LEFT KIDNEY UPPER POLE PERIPHERAL EMBOLIZATION OF AN ABNORMAL LEFT INTERPOLE SEGMENTAL BRANCH SUPPLYING THE RENAL ANGIOMYOLIPOMA MEDICATIONS: 1% LIDOCAINE LOCAL. ANESTHESIA/SEDATION: Moderate (conscious) sedation was employed during this procedure. A total of Versed 2.0 mg and Fentanyl 100 mcg was administered intravenously by the radiology nurse. Total intra-service moderate Sedation Time: 56 minutes. The patient's level of consciousness and vital signs were monitored continuously by radiology nursing throughout the procedure under my direct supervision. CONTRAST:  76 cc omni 300 FLUOROSCOPY: Radiation Exposure Index (as provided by the fluoroscopic device): 1,414 MGy Kerma  COMPLICATIONS: None immediate. PROCEDURE: Informed consent was obtained from the patient following explanation of the procedure, risks, benefits and alternatives. The patient understands, agrees and consents for the procedure. All questions were addressed. A time out was performed prior to the initiation of the procedure. Maximal barrier sterile technique utilized including caps, mask, sterile gowns, sterile gloves, large sterile drape, hand hygiene, and Betadine prep. Under sterile conditions and local anesthesia, ultrasound micropuncture access performed of the right common femoral artery. Images obtained for documentation of the patent right common femoral artery. Five French sheath inserted over a Bentson guidewire. Initially, a C2 catheter was utilized to select the left renal artery. Initial left renal angiogram performed. Left renal artery origin is widely patent. Minor nonocclusive atherosclerotic change of the main renal artery. Peripheral branches are patent. Previous coil embolization noted within the mid to upper pole region. Initial angiogram demonstrates trace abnormal vascularity along the left kidney lateral cortical margin suspicious for an area of bleeding versus tumor vascularity. Catheter exchangeD for a chung 2.5 catheter. This was reformed in the thoracic aorta and retracted to select the left renal artery. Additional left renal angiograms performed in oblique projections. Again, from the mid pole laterally there is abnormal vascularity outside of the renal parenchymal phase concerning for an area of arterial bleeding when correlated with CTA. Through the chung 2.5 catheter, the Renegade STC catheter was advanced over a fathom 14 guidewire initially into an upper pole segmental branch. Angiogram within this branch demonstrates normal parenchymal staining without tumor vascularity. Microcatheter retracted and utilized to select an additional segmental branch to the upper pole. Angiogram within  this second branch demonstrates normal renal vascularity as well. Microcatheter retracted and advanced over a GT Glidewire into mid pole segmental branches. Additional angiogram demonstrates normal renal vascularity. Faint tumor vascularity still visualized laterally. Over the fathom 14 guidewire, eventually the microcatheter was advanced into an interpolar branch close to the existing microcoils. Angiogram within this segmental branch demonstrates enlarged tortuous tumor vascularity with contrast extravasation into the surrounding retroperitoneal space. This correlates with the CTA. From this location, embolization was performed. Embolization: Through the Renegade STC catheter, 3 cc of 300-500 micron embospheres were slowly instilled under intermittent fluoroscopy watching for reflux. Once reflux was visualized, a single 3mm x 4 cm Azur CX detachable microcoil was successfully deployed within the interpolar abnormal segmental branch. After approximately 5 minutes, repeat angiogram performed through the microcatheter within the branch. Following embolization this branch is occluded and the abnormal vascularity to the angiomyolipoma is no longer visualized. Microcatheter removed. Final renal angiogram performed through the 5 French Chung catheter. This confirms preservation of the main renal artery supply to the left kidney. No additional abnormal vascularity visualized. Injection of the right common femoral artery sheath confirms adequate access for  closure device. Of note, there is beaded appearance of the right external iliac artery compatible with nonocclusive fibromuscular dysplasia. Right common femoral, proximal profunda femoral, proximal superficial femoral arteries are all patent. Successful deployment of the CELT device for hemostasis. IMPRESSION: Successful left renal angiography with micro catheterization and coil embolization of a left renal interpolar segmental branch supplying tumor vascularity to the  known renal angiomyolipoma with evidence of active bleeding. Electronically Signed   By: Judie Petit.  Shick M.D.   On: 03/11/2023 08:19    Scheduled Meds:  sodium chloride   Intravenous Once   amiodarone  100 mg Oral Daily   docusate sodium  100 mg Oral BID   potassium chloride  40 mEq Oral BID   sodium chloride flush  3 mL Intravenous Q12H   Continuous Infusions:  sodium chloride     sodium phosphate 15 mmol in dextrose 5 % 250 mL infusion 15 mmol (03/12/23 1734)    LOS: 2 days   Marguerita Merles, DO [facility] Available via Epic secure chat 7am-7pm After these hours, please refer to coverage provider listed on [email or web address] 03/12/2023, 7:45 PM

## 2023-03-12 NOTE — Progress Notes (Signed)
PT Cancellation Note  Patient Details Name: INDRA WOLTERS MRN: [id number] DOB: 02/08/1946   Cancelled Treatment:    Reason Eval/Treat Not Completed: Other (comment).  Screened pt who feels she is doing fine, doesn't need PT at this time.  Encouraged pt to ask nursing for PT to return if her feeling of independence changes.  Signing off for now.   Ivar Drape 03/12/2023, 12:14 PM  Samul Dada, PT PhD Acute Rehab Dept. Number: [facility] [id number] and [facility] [phone]

## 2023-03-12 NOTE — TOC Initial Note (Signed)
Transition of Care [facility]) - Initial/Assessment Note    Patient Details  Name: Ann Vang MRN: [id number] Date of Birth: 1945-11-24  [facility] CM/SW Contact:    Tom-Johnson, Hershal Coria, RN Phone Number: 03/12/2023, 3:31 PM  Clinical Narrative:                  CM spoke with patient at bedside about needs for post hospital transition.  Presented [facility] the [facility] ED with sharp Abd pain and nausea. CT Abd/Pelvis showed Left Renal Hematoma with active extravasation.   Patient underwent Left Renal Angiogram and Peripheral Branch Embolization and 3 mm Coil placement by IR on 03/10/23. On Eliquis for Hx of A-fib.   From home with husband, [name]. Has two supportive children who lives out of town. Retired, Independent with care and workout 5 days/week. Does not have DME's at home.  PCP is Alysia Penna, MD and uses [facility] on [address] in [place].   PT/OT signed off, no needs. No TOC needs or recommendations noted at this time.  CM will continue to follow as patient progresses with care towards discharge.        Expected Discharge Plan: Home/Self Care Barriers to Discharge: Continued Medical Work up   Patient Goals and CMS Choice Patient states their goals for this hospitalization and ongoing recovery are:: To return home CMS Medicare.gov Compare Post Acute Care list provided to:: Patient Choice offered to / list presented to : NA      Expected Discharge Plan and Services   Discharge Planning Services: CM Consult Post Acute Care Choice: NA Living arrangements for the past 2 months: Single Family Home                 DME Arranged: N/A DME Agency: NA       HH Arranged: NA HH Agency: NA        Prior Living Arrangements/Services Living arrangements for the past 2 months: Single Family Home Lives with:: Spouse Patient language and need for interpreter reviewed:: Yes Do you feel safe going back to the place where you  live?: Yes      Need for Family Participation in Patient Care: Yes (Comment) Care giver support system in place?: Yes (comment)   Criminal Activity/Legal Involvement Pertinent to Current Situation/Hospitalization: No - Comment as needed  Activities of Daily Living Home Assistive Devices/Equipment: None ADL Screening (condition at time of admission) Patient's cognitive ability adequate to safely complete daily activities?: Yes Is the patient deaf or have difficulty hearing?: No Does the patient have difficulty seeing, even when wearing glasses/contacts?: No Does the patient have difficulty concentrating, remembering, or making decisions?: No Patient able to express need for assistance with ADLs?: Yes Does the patient have difficulty dressing or bathing?: No Independently performs ADLs?: Yes (appropriate for developmental age) Does the patient have difficulty walking or climbing stairs?: No Weakness of Legs: None Weakness of Arms/Hands: None  Permission Sought/Granted Permission sought to share information with : Case Manager, Family Supports Permission granted to share information with : Yes, Verbal Permission Granted              Emotional Assessment Appearance:: Appears stated age Attitude/Demeanor/Rapport: Engaged, Gracious Affect (typically observed): Accepting, Appropriate, Calm, Hopeful, Pleasant Orientation: : Oriented to Self, Oriented to Place, Oriented to  Time, Oriented to Situation Alcohol / Substance Use: Not Applicable Psych Involvement: No (comment)  Admission diagnosis:  Hematoma [T14.8XXA] Renal hematoma, left [S37.012A] Hematoma of left kidney, initial  encounter [S37.012A] Patient Active Problem List   Diagnosis Date Noted   Hypokalemia 03/11/2023   Hyponatremia 03/11/2023   Hematoma 03/11/2023   Diverticulosis 03/10/2023   Renal hematoma, left 03/10/2023   Paroxysmal atrial fibrillation on Eliquis (HCC) 10/02/2020   Compression fracture of T2 vertebra  (HCC) 05/10/2019   Left renal hematoma with extravasation s/p embolectomy 03/10/2023 12/22/2018   Low back pain 12/21/2018   Acute pain of right shoulder 11/19/2017   Angiomyolipoma of kidney 05/13/2017   Chronic pain of right knee 09/25/2016   Flexor tenosynovitis of finger 03/14/2016   Pain 02/20/2016   Thyroid nodule 05/15/2015   Lipoma of back 10/06/2014   Lumbar compression fracture (HCC) 07/13/2012   Ear pressure 07/13/2012   DYSFUNCTION OF EUSTACHIAN TUBE 07/30/2010   OSTEOPENIA 03/15/2010   PCP:  Alysia Penna, MD Pharmacy:   [facility] [phone] [place] [address], [place] - [address] AT [facility] [address] [place] [address] Phone: [phone] Fax: [phone]     Social Determinants of Health (SDOH) Social History: SDOH Screenings   Food Insecurity: No Food Insecurity (03/11/2023)  Housing: Low Risk  (03/11/2023)  Transportation Needs: No Transportation Needs (03/11/2023)  Utilities: Not At Risk (03/11/2023)  Tobacco Use: Medium Risk (03/10/2023)   SDOH Interventions: Transportation Interventions: Intervention Not Indicated, Inpatient TOC, Patient Resources (Friends/Family)   Readmission Risk Interventions    03/12/2023    3:29 PM  Readmission Risk Prevention Plan  Post Dischage Appt Complete  Medication Screening Complete  Transportation Screening Complete

## 2023-03-12 NOTE — Progress Notes (Signed)
OT Cancellation Note  Patient Details Name: KAPRI NERO MRN: [id number] DOB: 1946/01/06   Cancelled Treatment:    Reason Eval/Treat Not Completed: (P) PT screened, no needs identified, will sign off, Pt states PLOF ind, driving, exercises daily, other than mild pain in abd. Pt states no loss of function and does not need therapy at this time.   Alexis Goodell 03/12/2023, 12:54 PM

## 2023-03-12 NOTE — Plan of Care (Signed)
Problem: Education: Goal: Knowledge of General Education information will improve Description: Including pain rating scale, medication(s)/side effects and non-pharmacologic comfort measures 03/12/2023 2341 by Craig Staggers, RN Outcome: Progressing 03/12/2023 2340 by Craig Staggers, RN Outcome: Progressing   Problem: Health Behavior/Discharge Planning: Goal: Ability to manage health-related needs will improve 03/12/2023 2341 by Craig Staggers, RN Outcome: Progressing 03/12/2023 2340 by Craig Staggers, RN Outcome: Progressing   Problem: Clinical Measurements: Goal: Ability to maintain clinical measurements within normal limits will improve 03/12/2023 2341 by Craig Staggers, RN Outcome: Progressing 03/12/2023 2340 by Craig Staggers, RN Outcome: Progressing Goal: Will remain free from infection 03/12/2023 2341 by Craig Staggers, RN Outcome: Progressing 03/12/2023 2340 by Craig Staggers, RN Outcome: Progressing Goal: Diagnostic test results will improve 03/12/2023 2341 by Craig Staggers, RN Outcome: Progressing 03/12/2023 2340 by Craig Staggers, RN Outcome: Progressing Goal: Respiratory complications will improve 03/12/2023 2341 by Craig Staggers, RN Outcome: Progressing 03/12/2023 2340 by Craig Staggers, RN Outcome: Progressing Goal: Cardiovascular complication will be avoided 03/12/2023 2341 by Craig Staggers, RN Outcome: Progressing 03/12/2023 2340 by Craig Staggers, RN Outcome: Progressing   Problem: Activity: Goal: Risk for activity intolerance will decrease 03/12/2023 2341 by Craig Staggers, RN Outcome: Progressing 03/12/2023 2340 by Craig Staggers, RN Outcome: Progressing   Problem: Nutrition: Goal: Adequate nutrition will be maintained 03/12/2023 2341 by Craig Staggers, RN Outcome: Progressing 03/12/2023 2340 by Craig Staggers, RN Outcome: Progressing   Problem: Coping: Goal: Level of anxiety will decrease 03/12/2023 2341 by Craig Staggers, RN Outcome:  Progressing 03/12/2023 2340 by Craig Staggers, RN Outcome: Progressing   Problem: Elimination: Goal: Will not experience complications related to bowel motility 03/12/2023 2341 by Craig Staggers, RN Outcome: Progressing 03/12/2023 2340 by Craig Staggers, RN Outcome: Progressing Goal: Will not experience complications related to urinary retention 03/12/2023 2341 by Craig Staggers, RN Outcome: Progressing 03/12/2023 2340 by Craig Staggers, RN Outcome: Progressing   Problem: Pain Managment: Goal: General experience of comfort will improve 03/12/2023 2341 by Craig Staggers, RN Outcome: Progressing 03/12/2023 2340 by Craig Staggers, RN Outcome: Progressing   Problem: Safety: Goal: Ability to remain free from injury will improve 03/12/2023 2341 by Craig Staggers, RN Outcome: Progressing 03/12/2023 2340 by Craig Staggers, RN Outcome: Progressing   Problem: Skin Integrity: Goal: Risk for impaired skin integrity will decrease 03/12/2023 2341 by Craig Staggers, RN Outcome: Progressing 03/12/2023 2340 by Craig Staggers, RN Outcome: Progressing   Problem: Education: Goal: Understanding of CV disease, CV risk reduction, and recovery process will improve 03/12/2023 2341 by Craig Staggers, RN Outcome: Progressing 03/12/2023 2340 by Craig Staggers, RN Outcome: Progressing Goal: Individualized Educational Video(s) 03/12/2023 2341 by Craig Staggers, RN Outcome: Progressing 03/12/2023 2340 by Craig Staggers, RN Outcome: Progressing   Problem: Activity: Goal: Ability to return to baseline activity level will improve 03/12/2023 2341 by Craig Staggers, RN Outcome: Progressing 03/12/2023 2340 by Craig Staggers, RN Outcome: Progressing   Problem: Cardiovascular: Goal: Ability to achieve and maintain adequate cardiovascular perfusion will improve 03/12/2023 2341 by Craig Staggers, RN Outcome: Progressing 03/12/2023 2340 by Craig Staggers, RN Outcome: Progressing Goal: Vascular access site(s) Level  0-1 will be maintained 03/12/2023 2341 by Craig Staggers, RN Outcome: Progressing 03/12/2023 2340 by Craig Staggers, RN Outcome: Progressing   Problem: Health Behavior/Discharge Planning: Goal: Ability to safely manage health-related needs after  discharge will improve 03/12/2023 2341 by Craig Staggers, RN Outcome: Progressing 03/12/2023 2340 by Craig Staggers, RN Outcome: Progressing

## 2023-03-12 NOTE — Consult Note (Signed)
Urology Consult Note   Requesting Attending Physician:  Merlene Laughter, DO Service Providing Consult: Urology  Consulting Attending: Dr Jerilee Field   Reason for Consult:  left renal hematoma  HPI: Ann Vang is seen in consultation for reasons noted above at the request of Merlene Laughter, DO for evaluation of left renal hematoma.  This is a 77 y.o. female with history of arthritis, carpal tunnel syndrome, diverticulitis, thyroid disease, Afib on Eliquis and amiodarone with known large left AML s/p IR emobolization in 2018 who presented with Acute onset of abdominal pain yesterday.  She had a CT scan done which showed active extravasation from a left renal hematoma.  Interventional radiology was consulted and she is now status post embolization.  Urology consulted for further management given left renal hematoma.   She has seen in the past by Dr Thana Ates for medical management of AML. Last seen 01/2023 where they held off on starting everlimus.   Today she reports no fever, chills, nausea or vomiting. Denies lightheadedness or dizziness.   Urinating well. Urine clear without clot.   She is on Eliquis for Afib. Considering watchman procedure. Wants to talk to cardiologist to see if she can stop Eliquis.    Past Medical History: Past Medical History:  Diagnosis Date   Abnormal breast finding 12/1998   left breast abnl   Arthritis    --basal joint--right hand   Carpal tunnel syndrome, right    Compression fracture of T2 vertebra (HCC) 05/10/2019   Diverticulosis 2003   mild   Lumbar compression fracture (HCC) 07/13/2012   Lumbar disc disorder 2000   bulging   Melena    Menopause    Osteoporosis    Fx L4 age 88 water skiing accident   Palpitations    Thyroid disease 10/2014   thyroid nodules--bx'd large nodule and was benign--sees Gen. Careers adviser with [facility]    Past Surgical History:  Past Surgical History:  Procedure Laterality Date    AUGMENTATION MAMMAPLASTY  1983   Implants have been removed   BREAST SURGERY  2010   implants removed ruptured   CERVICAL BIOPSY  W/ LOOP ELECTRODE EXCISION  1996   CIN   IR ANGIOGRAM SELECTIVE EACH ADDITIONAL VESSEL  03/10/2023   IR ANGIOGRAM SELECTIVE EACH ADDITIONAL VESSEL  03/10/2023   IR ANGIOGRAM SELECTIVE EACH ADDITIONAL VESSEL  03/10/2023   IR EMBO ART  VEN HEMORR LYMPH EXTRAV  INC GUIDE ROADMAPPING  03/10/2023   IR EMBO TUMOR ORGAN ISCHEMIA INFARCT INC GUIDE ROADMAPPING  05/13/2017   IR RADIOLOGIST EVAL & MGMT  04/15/2017   IR RADIOLOGIST EVAL & MGMT  05/27/2017   IR RADIOLOGIST EVAL & MGMT  01/21/2018   IR RADIOLOGIST EVAL & MGMT  09/28/2019   IR RADIOLOGIST EVAL & MGMT  10/07/2021   IR RENAL SELECTIVE  UNI INC S&I MOD SED  03/10/2023   IR RENAL SUPRASEL UNI S&I MOD SED  05/13/2017   IR US GUIDE VASC ACCESS LEFT  05/13/2017   IR US GUIDE VASC ACCESS RIGHT  03/10/2023   KNEE SURGERY  2012   meniscus rt knee (both)   L-2 fracture  2009   fall in aerobics   L-4 fracture   water skiing     TENOSYNOVECTOMY Right 03/11/2016   Procedure: TENOSYNOVECTOMY FLEXOR RIGHT RING FINGER;  Surgeon: Cindee Salt, MD;  Location:  [facility];  Service: Orthopedics;  Laterality: Right;   thyroid nodule biopsy  10/2014   --[facility], [place]--  TONSILLECTOMY AND ADENOIDECTOMY  1964    Medication: Current Facility-Administered Medications  Medication Dose Route Frequency Provider Last Rate Last Admin   0.9 %  sodium chloride infusion (Manually program via Guardrails IV Fluids)   Intravenous Once Janalyn Shy, Subrina, MD   Held at 03/10/23 2208   0.9 %  sodium chloride infusion  250 mL Intravenous PRN Janalyn Shy, Subrina, MD       acetaminophen (TYLENOL) tablet 650 mg  650 mg Oral Q6H PRN Janalyn Shy, Subrina, MD   650 mg at 03/12/23 1116   Or   acetaminophen (TYLENOL) suppository 650 mg  650 mg Rectal Q6H PRN Janalyn Shy, Subrina, MD       amiodarone (PACERONE) tablet 100 mg  100 mg Oral Daily Sundil,  Subrina, MD   100 mg at 03/12/23 1110   docusate sodium (COLACE) capsule 100 mg  100 mg Oral BID Janalyn Shy, Subrina, MD   100 mg at 03/12/23 1111   hydrALAZINE (APRESOLINE) injection 10 mg  10 mg Intravenous Q6H PRN Sundil, Subrina, MD       morphine (PF) 2 MG/ML injection 2 mg  2 mg Intravenous Q2H PRN Janalyn Shy, Subrina, MD   2 mg at 03/11/23 1209   potassium chloride SA (KLOR-CON M) CR tablet 40 mEq  40 mEq Oral BID Sheikh, Omair Latif, DO       sodium chloride flush (NS) 0.9 % injection 3 mL  3 mL Intravenous Q12H Sundil, Subrina, MD   3 mL at 03/12/23 1111   sodium chloride flush (NS) 0.9 % injection 3 mL  3 mL Intravenous PRN Sundil, Subrina, MD       sodium phosphate 15 mmol in dextrose 5 % 250 mL infusion  15 mmol Intravenous Once Sheikh, Omair Latif, DO        Allergies: No Known Allergies  Social History: Social History   Tobacco Use   Smoking status: Former    Current packs/day: 0.00    Types: Cigarettes    Quit date: 12/21/1972    Years since quitting: 50.2   Smokeless tobacco: Never  Vaping Use   Vaping status: Never Used  Substance Use Topics   Alcohol use: Yes    Alcohol/week: 14.0 standard drinks of alcohol    Types: 14 Glasses of wine per week   Drug use: No    Family History Family History  Problem Relation Age of Onset   Osteoporosis Mother    Alzheimer's disease Mother    Hypertension Father    Heart attack Father    Osteoporosis Maternal Grandmother    Colon cancer Maternal Grandmother    Colon cancer Other     Review of Systems 10 systems were reviewed and are negative except as noted specifically in the HPI.  Objective   Vital signs in last 24 hours: BP (!) 145/55 (BP Location: Left Arm)   Pulse 84   Temp 98.1 F (36.7 C) (Oral)   Resp 18   Ht 5\' 7"  (1.702 m)   Wt 72.6 kg   LMP 08/18/1994 (Approximate)   SpO2 99%   BMI 25.06 kg/m   Physical Exam General: NAD, A&O, resting, appropriate HEENT: [place]/AT, EOMI, MMM Pulmonary: Normal work of  breathing Cardiovascular: HDS, adequate peripheral perfusion Abdomen: Soft, NTTP, mild distension. GU: voiding spontaneously, minimal left CVA tenderness Extremities: warm and well perfused Neuro: Appropriate, no focal neurological deficits  Most Recent Labs: Lab Results  Component Value Date   WBC 10.9 (H) 03/12/2023   HGB 7.9 (L) 03/12/2023  HCT 23.4 (L) 03/12/2023   PLT 124 (L) 03/12/2023    Lab Results  Component Value Date   NA 134 (L) 03/12/2023   K 3.4 (L) 03/12/2023   CL 101 03/12/2023   CO2 26 03/12/2023   BUN 23 03/12/2023   CREATININE 0.98 03/12/2023   CALCIUM 8.4 (L) 03/12/2023   MG 2.2 03/12/2023   PHOS 2.2 (L) 03/12/2023    Lab Results  Component Value Date   INR 1.2 03/11/2023   APTT 26 03/11/2023     Urine Culture: @[id number] (laburin,org,[id number])@   IMAGING: IR US Guide Vasc Access Right  Result Date: 03/11/2023 INDICATION: Acute arterial bleeding from a known large left renal angiomyolipoma. Remote embolization 2018. EXAM: ULTRASOUND GUIDANCE FOR VASCULAR ACCESS LEFT RENAL ANGIOGRAM PERIPHERAL MICRO CATHETERIZATIONS AND ANGIOGRAMS OF 3 ADDITIONAL SEGMENTAL BRANCHES TO THE LEFT KIDNEY UPPER POLE PERIPHERAL EMBOLIZATION OF AN ABNORMAL LEFT INTERPOLE SEGMENTAL BRANCH SUPPLYING THE RENAL ANGIOMYOLIPOMA MEDICATIONS: 1% LIDOCAINE LOCAL. ANESTHESIA/SEDATION: Moderate (conscious) sedation was employed during this procedure. A total of Versed 2.0 mg and Fentanyl 100 mcg was administered intravenously by the radiology nurse. Total intra-service moderate Sedation Time: 56 minutes. The patient's level of consciousness and vital signs were monitored continuously by radiology nursing throughout the procedure under my direct supervision. CONTRAST:  76 cc omni 300 FLUOROSCOPY: Radiation Exposure Index (as provided by the fluoroscopic device): 1,414 MGy Kerma COMPLICATIONS: None immediate. PROCEDURE: Informed consent was obtained from the patient following explanation  of the procedure, risks, benefits and alternatives. The patient understands, agrees and consents for the procedure. All questions were addressed. A time out was performed prior to the initiation of the procedure. Maximal barrier sterile technique utilized including caps, mask, sterile gowns, sterile gloves, large sterile drape, hand hygiene, and Betadine prep. Under sterile conditions and local anesthesia, ultrasound micropuncture access performed of the right common femoral artery. Images obtained for documentation of the patent right common femoral artery. Five French sheath inserted over a Bentson guidewire. Initially, a C2 catheter was utilized to select the left renal artery. Initial left renal angiogram performed. Left renal artery origin is widely patent. Minor nonocclusive atherosclerotic change of the main renal artery. Peripheral branches are patent. Previous coil embolization noted within the mid to upper pole region. Initial angiogram demonstrates trace abnormal vascularity along the left kidney lateral cortical margin suspicious for an area of bleeding versus tumor vascularity. Catheter exchangeD for a chung 2.5 catheter. This was reformed in the thoracic aorta and retracted to select the left renal artery. Additional left renal angiograms performed in oblique projections. Again, from the mid pole laterally there is abnormal vascularity outside of the renal parenchymal phase concerning for an area of arterial bleeding when correlated with CTA. Through the chung 2.5 catheter, the Renegade STC catheter was advanced over a fathom 14 guidewire initially into an upper pole segmental branch. Angiogram within this branch demonstrates normal parenchymal staining without tumor vascularity. Microcatheter retracted and utilized to select an additional segmental branch to the upper pole. Angiogram within this second branch demonstrates normal renal vascularity as well. Microcatheter retracted and advanced over a GT  Glidewire into mid pole segmental branches. Additional angiogram demonstrates normal renal vascularity. Faint tumor vascularity still visualized laterally. Over the fathom 14 guidewire, eventually the microcatheter was advanced into an interpolar branch close to the existing microcoils. Angiogram within this segmental branch demonstrates enlarged tortuous tumor vascularity with contrast extravasation into the surrounding retroperitoneal space. This correlates with the CTA. From this location, embolization was performed. Embolization: Through the  Renegade STC catheter, 3 cc of 300-500 micron embospheres were slowly instilled under intermittent fluoroscopy watching for reflux. Once reflux was visualized, a single 3mm x 4 cm Azur CX detachable microcoil was successfully deployed within the interpolar abnormal segmental branch. After approximately 5 minutes, repeat angiogram performed through the microcatheter within the branch. Following embolization this branch is occluded and the abnormal vascularity to the angiomyolipoma is no longer visualized. Microcatheter removed. Final renal angiogram performed through the 5 French Chung catheter. This confirms preservation of the main renal artery supply to the left kidney. No additional abnormal vascularity visualized. Injection of the right common femoral artery sheath confirms adequate access for closure device. Of note, there is beaded appearance of the right external iliac artery compatible with nonocclusive fibromuscular dysplasia. Right common femoral, proximal profunda femoral, proximal superficial femoral arteries are all patent. Successful deployment of the CELT device for hemostasis. 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 Electronically Signed   By: Judie Petit.  Shick M.D.   On:  03/11/2023 08:19   IR Angiogram Renal Left Selective  Result Date: 03/11/2023 INDICATION: Acute arterial bleeding from a known large left renal angiomyolipoma. Remote embolization 2018. EXAM: ULTRASOUND GUIDANCE FOR VASCULAR ACCESS LEFT RENAL ANGIOGRAM PERIPHERAL MICRO CATHETERIZATIONS AND ANGIOGRAMS OF 3 ADDITIONAL SEGMENTAL BRANCHES TO THE LEFT KIDNEY UPPER POLE PERIPHERAL EMBOLIZATION OF AN ABNORMAL LEFT INTERPOLE SEGMENTAL BRANCH SUPPLYING THE RENAL ANGIOMYOLIPOMA MEDICATIONS: 1% LIDOCAINE LOCAL. ANESTHESIA/SEDATION: Moderate (conscious) sedation was employed during this procedure. A total of Versed 2.0 mg and Fentanyl 100 mcg was administered intravenously by the radiology nurse. Total intra-service moderate Sedation Time: 56 minutes. The patient's level of consciousness and vital signs were monitored continuously by radiology nursing throughout the procedure under my direct supervision. CONTRAST:  76 cc omni 300 FLUOROSCOPY: Radiation Exposure Index (as provided by the fluoroscopic device): 1,414 MGy Kerma COMPLICATIONS: None immediate. PROCEDURE: Informed consent was obtained from the patient following explanation of the procedure, risks, benefits and alternatives. The patient understands, agrees and consents for the procedure. All questions were addressed. A time out was performed prior to the initiation of the procedure. Maximal barrier sterile technique utilized including caps, mask, sterile gowns, sterile gloves, large sterile drape, hand hygiene, and Betadine prep. Under sterile conditions and local anesthesia, ultrasound micropuncture access performed of the right common femoral artery. Images obtained for documentation of the patent right common femoral artery. Five French sheath inserted over a Bentson guidewire. Initially, a C2 catheter was utilized to select the left renal artery. Initial left renal angiogram performed. Left renal artery origin is widely patent. Minor nonocclusive  atherosclerotic change of the main renal artery. Peripheral branches are patent. Previous coil embolization noted within the mid to upper pole region. Initial angiogram demonstrates trace abnormal vascularity along the left kidney lateral cortical margin suspicious for an area of bleeding versus tumor vascularity. Catheter exchangeD for a chung 2.5 catheter. This was reformed in the thoracic aorta and retracted to select the left renal artery. Additional left renal angiograms performed in oblique projections. Again, from the mid pole laterally there is abnormal vascularity outside of the renal parenchymal phase concerning for an area of arterial bleeding when correlated with CTA. Through the chung 2.5 catheter, the Renegade STC catheter was advanced over a fathom 14 guidewire initially into an upper pole segmental branch. Angiogram within this branch demonstrates normal parenchymal staining without tumor  vascularity. Microcatheter retracted and utilized to select an additional segmental branch to the upper pole. Angiogram within this second branch demonstrates normal renal vascularity as well. Microcatheter retracted and advanced over a GT Glidewire into mid pole segmental branches. Additional angiogram demonstrates normal renal vascularity. Faint tumor vascularity still visualized laterally. Over the fathom 14 guidewire, eventually the microcatheter was advanced into an interpolar branch close to the existing microcoils. Angiogram within this segmental branch demonstrates enlarged tortuous tumor vascularity with contrast extravasation into the surrounding retroperitoneal space. This correlates with the CTA. From this location, embolization was performed. Embolization: Through the Renegade STC catheter, 3 cc of 300-500 micron embospheres were slowly instilled under intermittent fluoroscopy watching for reflux. Once reflux was visualized, a single 3mm x 4 cm Azur CX detachable microcoil was successfully deployed  within the interpolar abnormal segmental branch. After approximately 5 minutes, repeat angiogram performed through the microcatheter within the branch. Following embolization this branch is occluded and the abnormal vascularity to the angiomyolipoma is no longer visualized. Microcatheter removed. Final renal angiogram performed through the 5 French Chung catheter. This confirms preservation of the main renal artery supply to the left kidney. No additional abnormal vascularity visualized. Injection of the right common femoral artery sheath confirms adequate access for closure device. Of note, there is beaded appearance of the right external iliac artery compatible with nonocclusive fibromuscular dysplasia. Right common femoral, proximal profunda femoral, proximal superficial femoral arteries are all patent. Successful deployment of the CELT device for hemostasis. 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 Electronically Signed   By: Judie Petit.  Shick M.D.   On: 03/11/2023 08:19   IR EMBO ART  VEN HEMORR LYMPH EXTRAV  INC GUIDE ROADMAPPING  Result Date: 03/11/2023 INDICATION: Acute arterial bleeding from a known large left renal angiomyolipoma. Remote embolization 2018. EXAM: ULTRASOUND GUIDANCE FOR VASCULAR ACCESS LEFT RENAL ANGIOGRAM PERIPHERAL MICRO CATHETERIZATIONS AND ANGIOGRAMS OF 3 ADDITIONAL SEGMENTAL BRANCHES TO THE LEFT KIDNEY UPPER POLE PERIPHERAL EMBOLIZATION OF AN ABNORMAL LEFT INTERPOLE SEGMENTAL BRANCH SUPPLYING THE RENAL ANGIOMYOLIPOMA MEDICATIONS: 1% LIDOCAINE LOCAL. ANESTHESIA/SEDATION: Moderate (conscious) sedation was employed during this procedure. A total of Versed 2.0 mg and Fentanyl 100 mcg was administered intravenously by the radiology nurse. Total intra-service moderate Sedation Time: 56 minutes. The  patient's level of consciousness and vital signs were monitored continuously by radiology nursing throughout the procedure under my direct supervision. CONTRAST:  76 cc omni 300 FLUOROSCOPY: Radiation Exposure Index (as provided by the fluoroscopic device): 1,414 MGy Kerma COMPLICATIONS: None immediate. PROCEDURE: Informed consent was obtained from the patient following explanation of the procedure, risks, benefits and alternatives. The patient understands, agrees and consents for the procedure. All questions were addressed. A time out was performed prior to the initiation of the procedure. Maximal barrier sterile technique utilized including caps, mask, sterile gowns, sterile gloves, large sterile drape, hand hygiene, and Betadine prep. Under sterile conditions and local anesthesia, ultrasound micropuncture access performed of the right common femoral artery. Images obtained for documentation of the patent right common femoral artery. Five French sheath inserted over a Bentson guidewire. Initially, a C2 catheter was utilized to select the left renal artery. Initial left renal angiogram performed. Left renal artery origin is widely patent. Minor nonocclusive atherosclerotic change of the main renal artery. Peripheral branches are patent. Previous coil embolization noted within the mid to upper pole region. Initial angiogram demonstrates  trace abnormal vascularity along the left kidney lateral cortical margin suspicious for an area of bleeding versus tumor vascularity. Catheter exchangeD for a chung 2.5 catheter. This was reformed in the thoracic aorta and retracted to select the left renal artery. Additional left renal angiograms performed in oblique projections. Again, from the mid pole laterally there is abnormal vascularity outside of the renal parenchymal phase concerning for an area of arterial bleeding when correlated with CTA. Through the chung 2.5 catheter, the Renegade STC catheter was advanced over a  fathom 14 guidewire initially into an upper pole segmental branch. Angiogram within this branch demonstrates normal parenchymal staining without tumor vascularity. Microcatheter retracted and utilized to select an additional segmental branch to the upper pole. Angiogram within this second branch demonstrates normal renal vascularity as well. Microcatheter retracted and advanced over a GT Glidewire into mid pole segmental branches. Additional angiogram demonstrates normal renal vascularity. Faint tumor vascularity still visualized laterally. Over the fathom 14 guidewire, eventually the microcatheter was advanced into an interpolar branch close to the existing microcoils. Angiogram within this segmental branch demonstrates enlarged tortuous tumor vascularity with contrast extravasation into the surrounding retroperitoneal space. This correlates with the CTA. From this location, embolization was performed. Embolization: Through the Renegade STC catheter, 3 cc of 300-500 micron embospheres were slowly instilled under intermittent fluoroscopy watching for reflux. Once reflux was visualized, a single 3mm x 4 cm Azur CX detachable microcoil was successfully deployed within the interpolar abnormal segmental branch. After approximately 5 minutes, repeat angiogram performed through the microcatheter within the branch. Following embolization this branch is occluded and the abnormal vascularity to the angiomyolipoma is no longer visualized. Microcatheter removed. Final renal angiogram performed through the 5 French Chung catheter. This confirms preservation of the main renal artery supply to the left kidney. No additional abnormal vascularity visualized. Injection of the right common femoral artery sheath confirms adequate access for closure device. Of note, there is beaded appearance of the right external iliac artery compatible with nonocclusive fibromuscular dysplasia. Right common femoral, proximal profunda femoral,  proximal superficial femoral arteries are all patent. Successful deployment of the CELT device for hemostasis. 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 Electronically Signed   By: Judie Petit.  Shick M.D.   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 Remote embolization 2018. EXAM: ULTRASOUND GUIDANCE FOR VASCULAR ACCESS LEFT RENAL ANGIOGRAM PERIPHERAL MICRO CATHETERIZATIONS AND ANGIOGRAMS OF 3 ADDITIONAL SEGMENTAL BRANCHES TO THE LEFT KIDNEY UPPER POLE PERIPHERAL EMBOLIZATION OF AN ABNORMAL LEFT INTERPOLE SEGMENTAL BRANCH SUPPLYING THE RENAL ANGIOMYOLIPOMA MEDICATIONS: 1% LIDOCAINE LOCAL. ANESTHESIA/SEDATION: Moderate (conscious) sedation was employed during this procedure. A total of Versed 2.0 mg and Fentanyl 100 mcg was administered intravenously by the radiology nurse. Total intra-service moderate Sedation Time: 56 minutes. The patient's level of consciousness and vital signs were monitored continuously by radiology nursing throughout the procedure under my direct supervision. CONTRAST:  76 cc omni 300 FLUOROSCOPY: Radiation Exposure Index (as provided by the fluoroscopic device): 1,414 MGy Kerma COMPLICATIONS: None immediate. PROCEDURE: Informed consent was obtained from the patient following explanation of the procedure, risks, benefits and alternatives. The patient understands, agrees and consents for the procedure. All questions were addressed. A time out was performed prior to the initiation of the procedure. Maximal barrier sterile  technique utilized including caps, mask, sterile gowns, sterile gloves, large sterile drape, hand hygiene, and Betadine prep. Under sterile conditions and local  anesthesia, ultrasound micropuncture access performed of the right common femoral artery. Images obtained for documentation of the patent right common femoral artery. Five French sheath inserted over a Bentson guidewire. Initially, a C2 catheter was utilized to select the left renal artery. Initial left renal angiogram performed. Left renal artery origin is widely patent. Minor nonocclusive atherosclerotic change of the main renal artery. Peripheral branches are patent. Previous coil embolization noted within the mid to upper pole region. Initial angiogram demonstrates trace abnormal vascularity along the left kidney lateral cortical margin suspicious for an area of bleeding versus tumor vascularity. Catheter exchangeD for a chung 2.5 catheter. This was reformed in the thoracic aorta and retracted to select the left renal artery. Additional left renal angiograms performed in oblique projections. Again, from the mid pole laterally there is abnormal vascularity outside of the renal parenchymal phase concerning for an area of arterial bleeding when correlated with CTA. Through the chung 2.5 catheter, the Renegade STC catheter was advanced over a fathom 14 guidewire initially into an upper pole segmental branch. Angiogram within this branch demonstrates normal parenchymal staining without tumor vascularity. Microcatheter retracted and utilized to select an additional segmental branch to the upper pole. Angiogram within this second branch demonstrates normal renal vascularity as well. Microcatheter retracted and advanced over a GT Glidewire into mid pole segmental branches. Additional angiogram demonstrates normal renal vascularity. Faint tumor vascularity still visualized laterally. Over the fathom 14 guidewire, eventually the microcatheter was advanced into an interpolar branch close to the existing microcoils. Angiogram within this segmental branch demonstrates enlarged tortuous tumor vascularity with contrast  extravasation into the surrounding retroperitoneal space. This correlates with the CTA. From this location, embolization was performed. Embolization: Through the Renegade STC catheter, 3 cc of 300-500 micron embospheres were slowly instilled under intermittent fluoroscopy watching for reflux. Once reflux was visualized, a single 3mm x 4 cm Azur CX detachable microcoil was successfully deployed within the interpolar abnormal segmental branch. After approximately 5 minutes, repeat angiogram performed through the microcatheter within the branch. Following embolization this branch is occluded and the abnormal vascularity to the angiomyolipoma is no longer visualized. Microcatheter removed. Final renal angiogram performed through the 5 French Chung catheter. This confirms preservation of the main renal artery supply to the left kidney. No additional abnormal vascularity visualized. Injection of the right common femoral artery sheath confirms adequate access for closure device. Of note, there is beaded appearance of the right external iliac artery compatible with nonocclusive fibromuscular dysplasia. Right common femoral, proximal profunda femoral, proximal superficial femoral arteries are all patent. Successful deployment of the CELT device for hemostasis. 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 Electronically Signed   By: Judie Petit.  Shick M.D.   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 Remote embolization 2018. EXAM: ULTRASOUND GUIDANCE FOR VASCULAR ACCESS LEFT RENAL ANGIOGRAM PERIPHERAL MICRO CATHETERIZATIONS AND ANGIOGRAMS OF 3 ADDITIONAL SEGMENTAL BRANCHES TO THE LEFT KIDNEY UPPER POLE PERIPHERAL  EMBOLIZATION OF AN ABNORMAL LEFT INTERPOLE SEGMENTAL BRANCH SUPPLYING THE RENAL ANGIOMYOLIPOMA MEDICATIONS: 1% LIDOCAINE LOCAL. ANESTHESIA/SEDATION: Moderate (conscious) sedation was employed during this procedure. A total of Versed 2.0 mg and Fentanyl  100 mcg was administered intravenously by the radiology nurse. Total intra-service moderate Sedation Time: 56 minutes. The patient's level of consciousness and vital signs were monitored continuously by radiology nursing throughout the procedure under my direct supervision. CONTRAST:  76 cc omni 300 FLUOROSCOPY: Radiation Exposure Index (as provided by the fluoroscopic device): 1,414 MGy Kerma COMPLICATIONS: None immediate. PROCEDURE: Informed consent was obtained from the patient following explanation of the procedure, risks, benefits and alternatives. The patient understands, agrees and consents for the procedure. All questions were addressed. A time out was performed prior to the initiation of the procedure. Maximal barrier sterile technique utilized including caps, mask, sterile gowns, sterile gloves, large sterile drape, hand hygiene, and Betadine prep. Under sterile conditions and local anesthesia, ultrasound micropuncture access performed of the right common femoral artery. Images obtained for documentation of the patent right common femoral artery. Five French sheath inserted over a Bentson guidewire. Initially, a C2 catheter was utilized to select the left renal artery. Initial left renal angiogram performed. Left renal artery origin is widely patent. Minor nonocclusive atherosclerotic change of the main renal artery. Peripheral branches are patent. Previous coil embolization noted within the mid to upper pole region. Initial angiogram demonstrates trace abnormal vascularity along the left kidney lateral cortical margin suspicious for an area of bleeding versus tumor vascularity. Catheter exchangeD for a chung 2.5 catheter. This was reformed in the thoracic  aorta and retracted to select the left renal artery. Additional left renal angiograms performed in oblique projections. Again, from the mid pole laterally there is abnormal vascularity outside of the renal parenchymal phase concerning for an area of arterial bleeding when correlated with CTA. Through the chung 2.5 catheter, the Renegade STC catheter was advanced over a fathom 14 guidewire initially into an upper pole segmental branch. Angiogram within this branch demonstrates normal parenchymal staining without tumor vascularity. Microcatheter retracted and utilized to select an additional segmental branch to the upper pole. Angiogram within this second branch demonstrates normal renal vascularity as well. Microcatheter retracted and advanced over a GT Glidewire into mid pole segmental branches. Additional angiogram demonstrates normal renal vascularity. Faint tumor vascularity still visualized laterally. Over the fathom 14 guidewire, eventually the microcatheter was advanced into an interpolar branch close to the existing microcoils. Angiogram within this segmental branch demonstrates enlarged tortuous tumor vascularity with contrast extravasation into the surrounding retroperitoneal space. This correlates with the CTA. From this location, embolization was performed. Embolization: Through the Renegade STC catheter, 3 cc of 300-500 micron embospheres were slowly instilled under intermittent fluoroscopy watching for reflux. Once reflux was visualized, a single 3mm x 4 cm Azur CX detachable microcoil was successfully deployed within the interpolar abnormal segmental branch. After approximately 5 minutes, repeat angiogram performed through the microcatheter within the branch. Following embolization this branch is occluded and the abnormal vascularity to the angiomyolipoma is no longer visualized. Microcatheter removed. Final renal angiogram performed through the 5 French Chung catheter. This confirms preservation of  the main renal artery supply to the left kidney. No additional abnormal vascularity visualized. Injection of the right common femoral artery sheath confirms adequate access for closure device. Of note, there is beaded appearance of the right external iliac artery compatible with nonocclusive fibromuscular dysplasia. Right common femoral, proximal profunda femoral, proximal superficial femoral arteries are all patent. Successful deployment of the CELT device for hemostasis. IMPRESSION: Successful left renal angiography with micro catheterization and coil embolization of a left renal interpolar segmental branch supplying tumor vascularity to the known renal angiomyolipoma with  evidence of active bleeding. Electronically Signed   By: Judie Petit.  Shick M.D.   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 Remote embolization 2018. EXAM: ULTRASOUND GUIDANCE FOR VASCULAR ACCESS LEFT RENAL ANGIOGRAM PERIPHERAL MICRO CATHETERIZATIONS AND ANGIOGRAMS OF 3 ADDITIONAL SEGMENTAL BRANCHES TO THE LEFT KIDNEY UPPER POLE PERIPHERAL EMBOLIZATION OF AN ABNORMAL LEFT INTERPOLE SEGMENTAL BRANCH SUPPLYING THE RENAL ANGIOMYOLIPOMA MEDICATIONS: 1% LIDOCAINE LOCAL. ANESTHESIA/SEDATION: Moderate (conscious) sedation was employed during this procedure. A total of Versed 2.0 mg and Fentanyl 100 mcg was administered intravenously by the radiology nurse. Total intra-service moderate Sedation Time: 56 minutes. The patient's level of consciousness and vital signs were monitored continuously by radiology nursing throughout the procedure under my direct supervision. CONTRAST:  76 cc omni 300 FLUOROSCOPY: Radiation Exposure Index (as provided by the fluoroscopic device): 1,414 MGy Kerma COMPLICATIONS: None immediate. PROCEDURE: Informed consent was obtained from the patient following explanation of the procedure, risks,  benefits and alternatives. The patient understands, agrees and consents for the procedure. All questions were addressed. A time out was performed prior to the initiation of the procedure. Maximal barrier sterile technique utilized including caps, mask, sterile gowns, sterile gloves, large sterile drape, hand hygiene, and Betadine prep. Under sterile conditions and local anesthesia, ultrasound micropuncture access performed of the right common femoral artery. Images obtained for documentation of the patent right common femoral artery. Five French sheath inserted over a Bentson guidewire. Initially, a C2 catheter was utilized to select the left renal artery. Initial left renal angiogram performed. Left renal artery origin is widely patent. Minor nonocclusive atherosclerotic change of the main renal artery. Peripheral branches are patent. Previous coil embolization noted within the mid to upper pole region. Initial angiogram demonstrates trace abnormal vascularity along the left kidney lateral cortical margin suspicious for an area of bleeding versus tumor vascularity. Catheter exchangeD for a chung 2.5 catheter. This was reformed in the thoracic aorta and retracted to select the left renal artery. Additional left renal angiograms performed in oblique projections. Again, from the mid pole laterally there is abnormal vascularity outside of the renal parenchymal phase concerning for an area of arterial bleeding when correlated with CTA. Through the chung 2.5 catheter, the Renegade STC catheter was advanced over a fathom 14 guidewire initially into an upper pole segmental branch. Angiogram within this branch demonstrates normal parenchymal staining without tumor vascularity. Microcatheter retracted and utilized to select an additional segmental branch to the upper pole. Angiogram within this second branch demonstrates normal renal vascularity as well. Microcatheter retracted and advanced over a GT Glidewire into mid pole  segmental branches. Additional angiogram demonstrates normal renal vascularity. Faint tumor vascularity still visualized laterally. Over the fathom 14 guidewire, eventually the microcatheter was advanced into an interpolar branch close to the existing microcoils. Angiogram within this segmental branch demonstrates enlarged tortuous tumor vascularity with contrast extravasation into the surrounding retroperitoneal space. This correlates with the CTA. From this location, embolization was performed. Embolization: Through the Renegade STC catheter, 3 cc of 300-500 micron embospheres were slowly instilled under intermittent fluoroscopy watching for reflux. Once reflux was visualized, a single 3mm x 4 cm Azur CX detachable microcoil was successfully deployed within the interpolar abnormal segmental branch. After approximately 5 minutes, repeat angiogram performed through the microcatheter within the branch. Following embolization this branch is occluded and the abnormal vascularity to the angiomyolipoma is no longer visualized. Microcatheter removed. Final renal angiogram performed  through the 5 [place] Chung catheter. This confirms preservation of the main renal artery supply to the left kidney. No additional abnormal vascularity visualized. Injection of the right common femoral artery sheath confirms adequate access for closure device. Of note, there is beaded appearance of the right external iliac artery compatible with nonocclusive fibromuscular dysplasia. Right common femoral, proximal profunda femoral, proximal superficial femoral arteries are all patent. Successful deployment of the CELT device for hemostasis. 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 Electronically Signed   By: Judie Petit.  Shick M.D.   On: 03/11/2023 08:19     ------  Assessment:  77 y.o. female with history of thyroid disease, diverticulitis, Afib currently on Eliquis and known left large AML s/p embolization in 2018 who presented with acute abdominal pain found to have a acute bleed from her left kidney now status post embolization with IR.  Urology consulted for further management of left hematoma.  Hemoglobin currently being trended. Labs with evidence of hemoglobin of 7.9 from 8.7 today. Down from 12 yesterday.   Cr stable at 0.98. she is currently AFHDS.     Recommendations: # Left subcapsular renal hematoma -No acute urologic intervention -Trend hemoglobin -If concern for acute bleed, would recommend discuss with IR to see if they would like repeat imaging with CTA and potential repeat emobolization.  -pt needs to follow up with outpatient urologist. She follow with Dr. Earlene Plater at [facility] and would like to continue to follow with him.  -urology to follow peripherally   Thank you for this consult. Please contact the urology consult pager with any further questions/concerns.

## 2023-03-12 NOTE — Plan of Care (Signed)
Problem: Education: Goal: Knowledge of General Education information will improve Description: Including pain rating scale, medication(s)/side effects and non-pharmacologic comfort measures 03/12/2023 2345 by Craig Staggers, RN Outcome: Progressing 03/12/2023 2341 by Craig Staggers, RN Outcome: Progressing 03/12/2023 2340 by Craig Staggers, RN Outcome: Progressing   Problem: Health Behavior/Discharge Planning: Goal: Ability to manage health-related needs will improve 03/12/2023 2345 by Craig Staggers, RN Outcome: Progressing 03/12/2023 2341 by Craig Staggers, RN Outcome: Progressing 03/12/2023 2340 by Craig Staggers, RN Outcome: Progressing   Problem: Clinical Measurements: Goal: Ability to maintain clinical measurements within normal limits will improve 03/12/2023 2345 by Craig Staggers, RN Outcome: Progressing 03/12/2023 2341 by Craig Staggers, RN Outcome: Progressing 03/12/2023 2340 by Craig Staggers, RN Outcome: Progressing Goal: Will remain free from infection 03/12/2023 2345 by Craig Staggers, RN Outcome: Progressing 03/12/2023 2341 by Craig Staggers, RN Outcome: Progressing 03/12/2023 2340 by Craig Staggers, RN Outcome: Progressing Goal: Diagnostic test results will improve 03/12/2023 2345 by Craig Staggers, RN Outcome: Progressing 03/12/2023 2341 by Craig Staggers, RN Outcome: Progressing 03/12/2023 2340 by Craig Staggers, RN Outcome: Progressing Goal: Respiratory complications will improve 03/12/2023 2345 by Craig Staggers, RN Outcome: Progressing 03/12/2023 2341 by Craig Staggers, RN Outcome: Progressing 03/12/2023 2340 by Craig Staggers, RN Outcome: Progressing Goal: Cardiovascular complication will be avoided 03/12/2023 2345 by Craig Staggers, RN Outcome: Progressing 03/12/2023 2341 by Craig Staggers, RN Outcome: Progressing 03/12/2023 2340 by Craig Staggers, RN Outcome: Progressing   Problem: Activity: Goal: Risk for activity intolerance will decrease 03/12/2023  2345 by Craig Staggers, RN Outcome: Progressing 03/12/2023 2341 by Craig Staggers, RN Outcome: Progressing 03/12/2023 2340 by Craig Staggers, RN Outcome: Progressing   Problem: Nutrition: Goal: Adequate nutrition will be maintained 03/12/2023 2345 by Craig Staggers, RN Outcome: Progressing 03/12/2023 2341 by Craig Staggers, RN Outcome: Progressing 03/12/2023 2340 by Craig Staggers, RN Outcome: Progressing   Problem: Coping: Goal: Level of anxiety will decrease 03/12/2023 2345 by Craig Staggers, RN Outcome: Progressing 03/12/2023 2341 by Craig Staggers, RN Outcome: Progressing 03/12/2023 2340 by Craig Staggers, RN Outcome: Progressing   Problem: Elimination: Goal: Will not experience complications related to bowel motility 03/12/2023 2345 by Craig Staggers, RN Outcome: Progressing 03/12/2023 2341 by Craig Staggers, RN Outcome: Progressing 03/12/2023 2340 by Craig Staggers, RN Outcome: Progressing Goal: Will not experience complications related to urinary retention 03/12/2023 2345 by Craig Staggers, RN Outcome: Progressing 03/12/2023 2341 by Craig Staggers, RN Outcome: Progressing 03/12/2023 2340 by Craig Staggers, RN Outcome: Progressing   Problem: Pain Managment: Goal: General experience of comfort will improve 03/12/2023 2345 by Craig Staggers, RN Outcome: Progressing 03/12/2023 2341 by Craig Staggers, RN Outcome: Progressing 03/12/2023 2340 by Craig Staggers, RN Outcome: Progressing   Problem: Safety: Goal: Ability to remain free from injury will improve 03/12/2023 2345 by Craig Staggers, RN Outcome: Progressing 03/12/2023 2341 by Craig Staggers, RN Outcome: Progressing 03/12/2023 2340 by Craig Staggers, RN Outcome: Progressing   Problem: Skin Integrity: Goal: Risk for impaired skin integrity will decrease 03/12/2023 2345 by Craig Staggers, RN Outcome: Progressing 03/12/2023 2341 by Craig Staggers, RN Outcome: Progressing 03/12/2023 2340 by Craig Staggers, RN Outcome:  Progressing   Problem: Education: Goal: Understanding of CV disease, CV risk reduction, and recovery process will improve 03/12/2023 2345 by Craig Staggers, RN Outcome: Progressing 03/12/2023 2341  by Craig Staggers, RN Outcome: Progressing 03/12/2023 2340 by Craig Staggers, RN Outcome: Progressing Goal: Individualized Educational Video(s) 03/12/2023 2345 by Craig Staggers, RN Outcome: Progressing 03/12/2023 2341 by Craig Staggers, RN Outcome: Progressing 03/12/2023 2340 by Craig Staggers, RN Outcome: Progressing   Problem: Activity: Goal: Ability to return to baseline activity level will improve 03/12/2023 2345 by Craig Staggers, RN Outcome: Progressing 03/12/2023 2341 by Craig Staggers, RN Outcome: Progressing 03/12/2023 2340 by Craig Staggers, RN Outcome: Progressing   Problem: Cardiovascular: Goal: Ability to achieve and maintain adequate cardiovascular perfusion will improve 03/12/2023 2345 by Craig Staggers, RN Outcome: Progressing 03/12/2023 2341 by Craig Staggers, RN Outcome: Progressing 03/12/2023 2340 by Craig Staggers, RN Outcome: Progressing Goal: Vascular access site(s) Level 0-1 will be maintained 03/12/2023 2345 by Craig Staggers, RN Outcome: Progressing 03/12/2023 2341 by Craig Staggers, RN Outcome: Progressing 03/12/2023 2340 by Craig Staggers, RN Outcome: Progressing   Problem: Health Behavior/Discharge Planning: Goal: Ability to safely manage health-related needs after discharge will improve 03/12/2023 2345 by Craig Staggers, RN Outcome: Progressing 03/12/2023 2341 by Craig Staggers, RN Outcome: Progressing 03/12/2023 2340 by Craig Staggers, RN Outcome: Progressing   Problem: Education: Goal: Knowledge of General Education information will improve Description: Including pain rating scale, medication(s)/side effects and non-pharmacologic comfort measures 03/12/2023 2345 by Craig Staggers, RN Outcome: Progressing 03/12/2023 2341 by Craig Staggers, RN Outcome:  Progressing 03/12/2023 2340 by Craig Staggers, RN Outcome: Progressing   Problem: Health Behavior/Discharge Planning: Goal: Ability to manage health-related needs will improve 03/12/2023 2345 by Craig Staggers, RN Outcome: Progressing 03/12/2023 2341 by Craig Staggers, RN Outcome: Progressing 03/12/2023 2340 by Craig Staggers, RN Outcome: Progressing   Problem: Clinical Measurements: Goal: Ability to maintain clinical measurements within normal limits will improve 03/12/2023 2345 by Craig Staggers, RN Outcome: Progressing 03/12/2023 2341 by Craig Staggers, RN Outcome: Progressing 03/12/2023 2340 by Craig Staggers, RN Outcome: Progressing Goal: Will remain free from infection 03/12/2023 2345 by Craig Staggers, RN Outcome: Progressing 03/12/2023 2341 by Craig Staggers, RN Outcome: Progressing 03/12/2023 2340 by Craig Staggers, RN Outcome: Progressing Goal: Diagnostic test results will improve 03/12/2023 2345 by Craig Staggers, RN Outcome: Progressing 03/12/2023 2341 by Craig Staggers, RN Outcome: Progressing 03/12/2023 2340 by Craig Staggers, RN Outcome: Progressing Goal: Respiratory complications will improve 03/12/2023 2345 by Craig Staggers, RN Outcome: Progressing 03/12/2023 2341 by Craig Staggers, RN Outcome: Progressing 03/12/2023 2340 by Craig Staggers, RN Outcome: Progressing Goal: Cardiovascular complication will be avoided 03/12/2023 2345 by Craig Staggers, RN Outcome: Progressing 03/12/2023 2341 by Craig Staggers, RN Outcome: Progressing 03/12/2023 2340 by Craig Staggers, RN Outcome: Progressing   Problem: Activity: Goal: Risk for activity intolerance will decrease 03/12/2023 2345 by Craig Staggers, RN Outcome: Progressing 03/12/2023 2341 by Craig Staggers, RN Outcome: Progressing 03/12/2023 2340 by Craig Staggers, RN Outcome: Progressing   Problem: Nutrition: Goal: Adequate nutrition will be maintained 03/12/2023 2345 by Craig Staggers, RN Outcome:  Progressing 03/12/2023 2341 by Craig Staggers, RN Outcome: Progressing 03/12/2023 2340 by Craig Staggers, RN Outcome: Progressing   Problem: Coping: Goal: Level of anxiety will decrease 03/12/2023 2345 by Craig Staggers, RN Outcome: Progressing 03/12/2023 2341 by Craig Staggers, RN Outcome: Progressing 03/12/2023 2340 by Craig Staggers, RN Outcome: Progressing   Problem: Elimination: Goal: Will not experience complications related to  bowel motility 03/12/2023 2345 by Craig Staggers, RN Outcome: Progressing 03/12/2023 2341 by Craig Staggers, RN Outcome: Progressing 03/12/2023 2340 by Craig Staggers, RN Outcome: Progressing Goal: Will not experience complications related to urinary retention 03/12/2023 2345 by Craig Staggers, RN Outcome: Progressing 03/12/2023 2341 by Craig Staggers, RN Outcome: Progressing 03/12/2023 2340 by Craig Staggers, RN Outcome: Progressing   Problem: Pain Managment: Goal: General experience of comfort will improve 03/12/2023 2345 by Craig Staggers, RN Outcome: Progressing 03/12/2023 2341 by Craig Staggers, RN Outcome: Progressing 03/12/2023 2340 by Craig Staggers, RN Outcome: Progressing   Problem: Safety: Goal: Ability to remain free from injury will improve 03/12/2023 2345 by Craig Staggers, RN Outcome: Progressing 03/12/2023 2341 by Craig Staggers, RN Outcome: Progressing 03/12/2023 2340 by Craig Staggers, RN Outcome: Progressing   Problem: Skin Integrity: Goal: Risk for impaired skin integrity will decrease 03/12/2023 2345 by Craig Staggers, RN Outcome: Progressing 03/12/2023 2341 by Craig Staggers, RN Outcome: Progressing 03/12/2023 2340 by Craig Staggers, RN Outcome: Progressing   Problem: Education: Goal: Understanding of CV disease, CV risk reduction, and recovery process will improve 03/12/2023 2345 by Craig Staggers, RN Outcome: Progressing 03/12/2023 2341 by Craig Staggers, RN Outcome: Progressing 03/12/2023 2340 by Craig Staggers,  RN Outcome: Progressing Goal: Individualized Educational Video(s) 03/12/2023 2345 by Craig Staggers, RN Outcome: Progressing 03/12/2023 2341 by Craig Staggers, RN Outcome: Progressing 03/12/2023 2340 by Craig Staggers, RN Outcome: Progressing   Problem: Activity: Goal: Ability to return to baseline activity level will improve 03/12/2023 2345 by Craig Staggers, RN Outcome: Progressing 03/12/2023 2341 by Craig Staggers, RN Outcome: Progressing 03/12/2023 2340 by Craig Staggers, RN Outcome: Progressing   Problem: Cardiovascular: Goal: Ability to achieve and maintain adequate cardiovascular perfusion will improve 03/12/2023 2345 by Craig Staggers, RN Outcome: Progressing 03/12/2023 2341 by Craig Staggers, RN Outcome: Progressing 03/12/2023 2340 by Craig Staggers, RN Outcome: Progressing Goal: Vascular access site(s) Level 0-1 will be maintained 03/12/2023 2345 by Craig Staggers, RN Outcome: Progressing 03/12/2023 2341 by Craig Staggers, RN Outcome: Progressing 03/12/2023 2340 by Craig Staggers, RN Outcome: Progressing   Problem: Health Behavior/Discharge Planning: Goal: Ability to safely manage health-related needs after discharge will improve 03/12/2023 2345 by Craig Staggers, RN Outcome: Progressing 03/12/2023 2341 by Craig Staggers, RN Outcome: Progressing 03/12/2023 2340 by Craig Staggers, RN Outcome: Progressing

## 2023-03-13 DIAGNOSIS — I480 Paroxysmal atrial fibrillation: Secondary | ICD-10-CM | POA: Diagnosis not present

## 2023-03-13 DIAGNOSIS — S37012A Minor contusion of left kidney, initial encounter: Secondary | ICD-10-CM | POA: Diagnosis not present

## 2023-03-13 DIAGNOSIS — K5790 Diverticulosis of intestine, part unspecified, without perforation or abscess without bleeding: Secondary | ICD-10-CM | POA: Diagnosis not present

## 2023-03-13 DIAGNOSIS — D1771 Benign lipomatous neoplasm of kidney: Secondary | ICD-10-CM | POA: Diagnosis not present

## 2023-03-13 LAB — PHOSPHORUS: Phosphorus: 2.8 mg/dL (ref 2.5–4.6)

## 2023-03-13 LAB — CBC WITH DIFFERENTIAL/PLATELET: Hemoglobin: 7.5 g/dL — ABNORMAL LOW (ref 12.0–15.0)

## 2023-03-13 MED ORDER — APIXABAN 5 MG PO TABS
5.0000 mg | ORAL_TABLET | Freq: Two times a day (BID) | ORAL | 1 refills | Status: DC
Start: 2023-03-18 — End: 2023-04-06

## 2023-03-13 NOTE — Care Management Important Message (Signed)
Important Message  Patient Details  Name: GAYATRI GRIMSRUD MRN: [id number] Date of Birth: Feb 07, 1946   Medicare Important Message Given:  Yes     Dorena Bodo 03/13/2023, 1:33 PM

## 2023-03-13 NOTE — Progress Notes (Signed)
     Subjective: First time meeting patient this morning.  She was accompanied by her husband.  She is alert, oriented, and in no distress.  Her pain is well-managed.  She is eager to return home  Objective: Vital signs in last 24 hours: Temp:  [98.1 F (36.7 C)-98.5 F (36.9 C)] 98.1 F (36.7 C) (07/26 0904) Pulse Rate:  [79-87] 79 (07/26 0904) Resp:  [18-20] 19 (07/26 0904) BP: (137-145)/(55-64) 143/64 (07/26 0904) SpO2:  [94 %-99 %] 97 % (07/26 0904) Weight:  [74.8 kg] 74.8 kg (07/26 0451)  Intake/Output from previous day: 07/25 0701 - 07/26 0700 In: 986.2 [P.O.:760; IV Piggyback:226.2] Out: 0   Intake/Output this shift: No intake/output data recorded.  Physical Exam:  General: Alert and oriented CV: No cyanosis Lungs: equal chest rise Abdomen: Soft, NTND, no rebound or guarding Skin: No flank bruising  Lab Results: Recent Labs    03/12/23 2309 03/13/23 0529 03/13/23 0742  HGB 7.2* 7.5* 7.9*  HCT 21.1* 21.8* 23.4*   BMET Recent Labs    03/12/23 1444 03/13/23 0529  NA 134* 137  K 3.4* 3.4*  CL 101 104  CO2 26 25  GLUCOSE 122* 106*  BUN 23 14  CREATININE 0.98 0.68  CALCIUM 8.4* 8.3*     Studies/Results: see report  Assessment/Plan: # Left subcapsular renal hematoma.   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  Patient reports opting not to return for repeat embolization at that time. S/p embolization of peripheral branch in interventional radiology on 7/23. Trend Hgb.  Encouraged by interval improvement from 7.2-7.9 this morning. Transfuse for Hgb <7.0.   Urology will continue to follow along with you.   LOS: 3 days   Elmon Kirschner, NP [facility] Pager: [phone]  03/13/2023, 9:39 AM

## 2023-03-13 NOTE — TOC Transition Note (Signed)
Transition of Care [facility]) - CM/SW Discharge Note   Patient Details  Name: Ann Vang MRN: [id number] Date of Birth: Jan 04, 1946  [facility] CM/SW Contact:  Tom-Johnson, Hershal Coria, RN Phone Number: 03/13/2023, 3:17 PM   Clinical Narrative:     Patient is scheduled for discharge today.  Readmission Risk Assessment done. Outpatient referral, hospital f/u and discharge instructions on AVS. No TOC needs or recommendations noted. Husband, [name] to transport at discharge.  No further TOC needs noted.      Final next level of care: Home/Self Care Barriers to Discharge: Barriers Resolved   Patient Goals and CMS Choice CMS Medicare.gov Compare Post Acute Care list provided to:: Patient Choice offered to / list presented to : NA  Discharge Placement                  Patient to be transferred to facility by: Husband Name of family member notified: [name]    Discharge Plan and Services Additional resources added to the After Visit Summary for     Discharge Planning Services: CM Consult Post Acute Care Choice: NA          DME Arranged: N/A DME Agency: NA       HH Arranged: NA HH Agency: NA        Social Determinants of Health (SDOH) Interventions SDOH Screenings   Food Insecurity: No Food Insecurity (03/11/2023)  Housing: Low Risk  (03/11/2023)  Transportation Needs: No Transportation Needs (03/11/2023)  Utilities: Not At Risk (03/11/2023)  Tobacco Use: Medium Risk (03/10/2023)     Readmission Risk Interventions    03/12/2023    3:29 PM  Readmission Risk Prevention Plan  Post Dischage Appt Complete  Medication Screening Complete  Transportation Screening Complete

## 2023-03-13 NOTE — Progress Notes (Signed)
Referring Physician(s): Dr. 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 Her husband Ann Vang is at the bedside. She endorses mild generalized abdominal pain. She has tentative plans for discharge home today and she is eager to go home.   Allergies: Patient has no known allergies.  Medications: Prior to Admission medications   Medication Sig Start Date End Date Taking? Authorizing Provider  amiodarone (PACERONE) 200 MG tablet Take 0.5 tablets (100 mg total) by mouth daily. 10/10/22  Yes Corky Crafts, MD  apixaban (ELIQUIS) 5 MG TABS tablet Take 1 tablet (5 mg total) by mouth 2 (two) times daily. 10/10/22  Yes Corky Crafts, MD  Bacillus Coagulans-Inulin (PROBIOTIC) 1-250 BILLION-MG CAPS Take 1 capsule by mouth daily.   Yes [provider]  Cholecalciferol (VITAMIN D) 2000 UNITS tablet Take 2,000 Units by mouth daily.   Yes [provider]  estradiol (VIVELLE-DOT) 0.0375 MG/24HR Place 1 patch onto the skin 2 (two) times a week. 04/16/21  Yes [provider]  fluticasone (FLONASE) 50 MCG/ACT nasal spray Place 2 sprays into the nose daily. Patient taking differently: Place 2 sprays into the nose daily as needed for allergies. 07/13/12  Yes Hodgin, Acie Fredrickson, MD  magnesium gluconate (MAGONATE) 500 MG tablet Take 500 mg by mouth 2 (two) times daily.   Yes [provider]  milk thistle 175 MG tablet Take 175 mg by mouth daily.   Yes [provider]  OVER THE COUNTER MEDICATION Take 1 tablet by mouth daily at 6 (six) AM. Medication: Vitamin K7   Yes [provider]  progesterone (PROMETRIUM) 100 MG capsule TAKE ONE CAPSULE BY MOUTH DAILY Patient taking differently: Take 100 mg by mouth daily. 02/23/19  Yes Jerene Bears, MD     Vital Signs: BP (!) 143/64 (BP  Location: Left Arm)   Pulse 79   Temp 98.1 F (36.7 C) (Oral)   Resp 19   Ht 5\' 7"  (1.702 m)   Wt 164 lb 14.5 oz (74.8 kg)   LMP 08/18/1994 (Approximate)   SpO2 97%   BMI 25.83 kg/m   Physical Exam Constitutional:      General: She is not in acute distress.    Appearance: She is not ill-appearing.  Cardiovascular:     Comments: Right groin vascular site is clean, soft, dry and non-tender.  Pulmonary:     Effort: Pulmonary effort is normal.  Abdominal:     Tenderness: There is abdominal tenderness.     Comments: Generalized mild tenderness.   Skin:    General: Skin is warm and dry.  Neurological:     Mental Status: She is alert and oriented to person, place, and time.  Psychiatric:        Mood and Affect: Mood normal.        Behavior: Behavior normal.        Thought Content: Thought content normal.        Judgment: Judgment normal.     Imaging: IR US Guide Vasc Access Right  Result Date: 03/11/2023 INDICATION: Acute arterial bleeding from a known large left renal angiomyolipoma. Remote embolization 2018. EXAM: ULTRASOUND GUIDANCE FOR VASCULAR ACCESS LEFT RENAL ANGIOGRAM PERIPHERAL MICRO CATHETERIZATIONS AND ANGIOGRAMS OF 3 ADDITIONAL SEGMENTAL BRANCHES TO THE LEFT KIDNEY UPPER POLE PERIPHERAL EMBOLIZATION OF AN ABNORMAL LEFT INTERPOLE SEGMENTAL BRANCH SUPPLYING THE RENAL  ANGIOMYOLIPOMA MEDICATIONS: 1% LIDOCAINE LOCAL. ANESTHESIA/SEDATION: Moderate (conscious) sedation was employed during this procedure. A total of Versed 2.0 mg and Fentanyl 100 mcg was administered intravenously by the radiology nurse. Total intra-service moderate Sedation Time: 56 minutes. The patient's level of consciousness and vital signs were monitored continuously by radiology nursing throughout the procedure under my direct supervision. CONTRAST:  76 cc omni 300 FLUOROSCOPY: Radiation Exposure Index (as provided by the fluoroscopic device): 1,414 MGy Kerma COMPLICATIONS: None immediate. PROCEDURE: Informed  consent was obtained from the patient following explanation of the procedure, risks, benefits and alternatives. The patient understands, agrees and consents for the procedure. All questions were addressed. A time out was performed prior to the initiation of the procedure. Maximal barrier sterile technique utilized including caps, mask, sterile gowns, sterile gloves, large sterile drape, hand hygiene, and Betadine prep. Under sterile conditions and local anesthesia, ultrasound micropuncture access performed of the right common femoral artery. Images obtained for documentation of the patent right common femoral artery. Five French sheath inserted over a Bentson guidewire. Initially, a C2 catheter was utilized to select the left renal artery. Initial left renal angiogram performed. Left renal artery origin is widely patent. Minor nonocclusive atherosclerotic change of the main renal artery. Peripheral branches are patent. Previous coil embolization noted within the mid to upper pole region. Initial angiogram demonstrates trace abnormal vascularity along the left kidney lateral cortical margin suspicious for an area of bleeding versus tumor vascularity. Catheter exchangeD for a chung 2.5 catheter. This was reformed in the thoracic aorta and retracted to select the left renal artery. Additional left renal angiograms performed in oblique projections. Again, from the mid pole laterally there is abnormal vascularity outside of the renal parenchymal phase concerning for an area of arterial bleeding when correlated with CTA. Through the chung 2.5 catheter, the Renegade STC catheter was advanced over a fathom 14 guidewire initially into an upper pole segmental branch. Angiogram within this branch demonstrates normal parenchymal staining without tumor vascularity. Microcatheter retracted and utilized to select an additional segmental branch to the upper pole. Angiogram within this second branch demonstrates normal renal  vascularity as well. Microcatheter retracted and advanced over a GT Glidewire into mid pole segmental branches. Additional angiogram demonstrates normal renal vascularity. Faint tumor vascularity still visualized laterally. Over the fathom 14 guidewire, eventually the microcatheter was advanced into an interpolar branch close to the existing microcoils. Angiogram within this segmental branch demonstrates enlarged tortuous tumor vascularity with contrast extravasation into the surrounding retroperitoneal space. This correlates with the CTA. From this location, embolization was performed. Embolization: Through the Renegade STC catheter, 3 cc of 300-500 micron embospheres were slowly instilled under intermittent fluoroscopy watching for reflux. Once reflux was visualized, a single 3mm x 4 cm Azur CX detachable microcoil was successfully deployed within the interpolar abnormal segmental branch. After approximately 5 minutes, repeat angiogram performed through the microcatheter within the branch. Following embolization this branch is occluded and the abnormal vascularity to the angiomyolipoma is no longer visualized. Microcatheter removed. Final renal angiogram performed through the 5 French Chung catheter. This confirms preservation of the main renal artery supply to the left kidney. No additional abnormal vascularity visualized. Injection of the right common femoral artery sheath confirms adequate access for closure device. Of note, there is beaded appearance of the right external iliac artery compatible with nonocclusive fibromuscular dysplasia. Right common femoral, proximal profunda femoral, proximal superficial femoral arteries are all patent. Successful deployment of the CELT device for hemostasis. IMPRESSION: Successful left renal angiography  with micro catheterization and coil embolization of a left renal interpolar segmental branch supplying tumor vascularity to the known renal angiomyolipoma with evidence of  active bleeding. Electronically Signed   By: Judie Petit.  Shick M.D.   On: 03/11/2023 08:19   IR Angiogram Renal Left Selective  Result Date: 03/11/2023 INDICATION: Acute arterial bleeding from a known large left renal angiomyolipoma. Remote embolization 2018. EXAM: ULTRASOUND GUIDANCE FOR VASCULAR ACCESS LEFT RENAL ANGIOGRAM PERIPHERAL MICRO CATHETERIZATIONS AND ANGIOGRAMS OF 3 ADDITIONAL SEGMENTAL BRANCHES TO THE LEFT KIDNEY UPPER POLE PERIPHERAL EMBOLIZATION OF AN ABNORMAL LEFT INTERPOLE SEGMENTAL BRANCH SUPPLYING THE RENAL ANGIOMYOLIPOMA MEDICATIONS: 1% LIDOCAINE LOCAL. ANESTHESIA/SEDATION: Moderate (conscious) sedation was employed during this procedure. A total of Versed 2.0 mg and Fentanyl 100 mcg was administered intravenously by the radiology nurse. Total intra-service moderate Sedation Time: 56 minutes. The patient's level of consciousness and vital signs were monitored continuously by radiology nursing throughout the procedure under my direct supervision. CONTRAST:  76 cc omni 300 FLUOROSCOPY: Radiation Exposure Index (as provided by the fluoroscopic device): 1,414 MGy Kerma COMPLICATIONS: None immediate. PROCEDURE: Informed consent was obtained from the patient following explanation of the procedure, risks, benefits and alternatives. The patient understands, agrees and consents for the procedure. All questions were addressed. A time out was performed prior to the initiation of the procedure. Maximal barrier sterile technique utilized including caps, mask, sterile gowns, sterile gloves, large sterile drape, hand hygiene, and Betadine prep. Under sterile conditions and local anesthesia, ultrasound micropuncture access performed of the right common femoral artery. Images obtained for documentation of the patent right common femoral artery. Five French sheath inserted over a Bentson guidewire. Initially, a C2 catheter was utilized to select the left renal artery. Initial left renal angiogram performed. Left renal  artery origin is widely patent. Minor nonocclusive atherosclerotic change of the main renal artery. Peripheral branches are patent. Previous coil embolization noted within the mid to upper pole region. Initial angiogram demonstrates trace abnormal vascularity along the left kidney lateral cortical margin suspicious for an area of bleeding versus tumor vascularity. Catheter exchangeD for a chung 2.5 catheter. This was reformed in the thoracic aorta and retracted to select the left renal artery. Additional left renal angiograms performed in oblique projections. Again, from the mid pole laterally there is abnormal vascularity outside of the renal parenchymal phase concerning for an area of arterial bleeding when correlated with CTA. Through the chung 2.5 catheter, the Renegade STC catheter was advanced over a fathom 14 guidewire initially into an upper pole segmental branch. Angiogram within this branch demonstrates normal parenchymal staining without tumor vascularity. Microcatheter retracted and utilized to select an additional segmental branch to the upper pole. Angiogram within this second branch demonstrates normal renal vascularity as well. Microcatheter retracted and advanced over a GT Glidewire into mid pole segmental branches. Additional angiogram demonstrates normal renal vascularity. Faint tumor vascularity still visualized laterally. Over the fathom 14 guidewire, eventually the microcatheter was advanced into an interpolar branch close to the existing microcoils. Angiogram within this segmental branch demonstrates enlarged tortuous tumor vascularity with contrast extravasation into the surrounding retroperitoneal space. This correlates with the CTA. From this location, embolization was performed. Embolization: Through the Renegade STC catheter, 3 cc of 300-500 micron embospheres were slowly instilled under intermittent fluoroscopy watching for reflux. Once reflux was visualized, a single 3mm x 4 cm Azur CX  detachable microcoil was successfully deployed within the interpolar abnormal segmental branch. After approximately 5 minutes, repeat angiogram performed through the microcatheter within the branch. Following  embolization this branch is occluded and the abnormal vascularity to the angiomyolipoma is no longer visualized. Microcatheter removed. Final renal angiogram performed through the 5 French Chung catheter. This confirms preservation of the main renal artery supply to the left kidney. No additional abnormal vascularity visualized. Injection of the right common femoral artery sheath confirms adequate access for closure device. Of note, there is beaded appearance of the right external iliac artery compatible with nonocclusive fibromuscular dysplasia. Right common femoral, proximal profunda femoral, proximal superficial femoral arteries are all patent. Successful deployment of the CELT device for hemostasis. 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 Electronically Signed   By: Judie Petit.  Shick M.D.   On: 03/11/2023 08:19   IR EMBO ART  VEN HEMORR LYMPH EXTRAV  INC GUIDE ROADMAPPING  Result Date: 03/11/2023 INDICATION: Acute arterial bleeding from a known large left renal angiomyolipoma. Remote embolization 2018. EXAM: ULTRASOUND GUIDANCE FOR VASCULAR ACCESS LEFT RENAL ANGIOGRAM PERIPHERAL MICRO CATHETERIZATIONS AND ANGIOGRAMS OF 3 ADDITIONAL SEGMENTAL BRANCHES TO THE LEFT KIDNEY UPPER POLE PERIPHERAL EMBOLIZATION OF AN ABNORMAL LEFT INTERPOLE SEGMENTAL BRANCH SUPPLYING THE RENAL ANGIOMYOLIPOMA MEDICATIONS: 1% LIDOCAINE LOCAL. ANESTHESIA/SEDATION: Moderate (conscious) sedation was employed during this procedure. A total of Versed 2.0 mg and Fentanyl 100 mcg was administered intravenously by the radiology nurse. Total intra-service  moderate Sedation Time: 56 minutes. The patient's level of consciousness and vital signs were monitored continuously by radiology nursing throughout the procedure under my direct supervision. CONTRAST:  76 cc omni 300 FLUOROSCOPY: Radiation Exposure Index (as provided by the fluoroscopic device): 1,414 MGy Kerma COMPLICATIONS: None immediate. PROCEDURE: Informed consent was obtained from the patient following explanation of the procedure, risks, benefits and alternatives. The patient understands, agrees and consents for the procedure. All questions were addressed. A time out was performed prior to the initiation of the procedure. Maximal barrier sterile technique utilized including caps, mask, sterile gowns, sterile gloves, large sterile drape, hand hygiene, and Betadine prep. Under sterile conditions and local anesthesia, ultrasound micropuncture access performed of the right common femoral artery. Images obtained for documentation of the patent right common femoral artery. Five French sheath inserted over a Bentson guidewire. Initially, a C2 catheter was utilized to select the left renal artery. Initial left renal angiogram performed. Left renal artery origin is widely patent. Minor nonocclusive atherosclerotic change of the main renal artery. Peripheral branches are patent. Previous coil embolization noted within the mid to upper pole region. Initial angiogram demonstrates trace abnormal vascularity along the left kidney lateral cortical margin suspicious for an area of bleeding versus tumor vascularity. Catheter exchangeD for a chung 2.5 catheter. This was reformed in the thoracic aorta and retracted to select the left renal artery. Additional left renal angiograms performed in oblique projections. Again, from the mid pole laterally there is abnormal vascularity outside of the renal parenchymal phase concerning for an area of arterial bleeding when correlated with CTA. Through the chung 2.5 catheter, the  Renegade STC catheter was advanced over a fathom 14 guidewire initially into an upper pole segmental branch. Angiogram within this branch demonstrates normal parenchymal staining without tumor vascularity. Microcatheter retracted and utilized to select an additional segmental branch to the upper pole. Angiogram within this second branch demonstrates normal renal vascularity as well. Microcatheter retracted and advanced over a GT Glidewire into mid pole segmental branches. Additional angiogram demonstrates normal renal vascularity. Faint tumor vascularity  still visualized laterally. Over the fathom 14 guidewire, eventually the microcatheter was advanced into an interpolar branch close to the existing microcoils. Angiogram within this segmental branch demonstrates enlarged tortuous tumor vascularity with contrast extravasation into the surrounding retroperitoneal space. This correlates with the CTA. From this location, embolization was performed. Embolization: Through the Renegade STC catheter, 3 cc of 300-500 micron embospheres were slowly instilled under intermittent fluoroscopy watching for reflux. Once reflux was visualized, a single 3mm x 4 cm Azur CX detachable microcoil was successfully deployed within the interpolar abnormal segmental branch. After approximately 5 minutes, repeat angiogram performed through the microcatheter within the branch. Following embolization this branch is occluded and the abnormal vascularity to the angiomyolipoma is no longer visualized. Microcatheter removed. Final renal angiogram performed through the 5 French Chung catheter. This confirms preservation of the main renal artery supply to the left kidney. No additional abnormal vascularity visualized. Injection of the right common femoral artery sheath confirms adequate access for closure device. Of note, there is beaded appearance of the right external iliac artery compatible with nonocclusive fibromuscular dysplasia. Right common  femoral, proximal profunda femoral, proximal superficial femoral arteries are all patent. Successful deployment of the CELT device for hemostasis. 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 Electronically Signed   By: Judie Petit.  Shick M.D.   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 Remote embolization 2018. EXAM: ULTRASOUND GUIDANCE FOR VASCULAR ACCESS LEFT RENAL ANGIOGRAM PERIPHERAL MICRO CATHETERIZATIONS AND ANGIOGRAMS OF 3 ADDITIONAL SEGMENTAL BRANCHES TO THE LEFT KIDNEY UPPER POLE PERIPHERAL EMBOLIZATION OF AN ABNORMAL LEFT INTERPOLE SEGMENTAL BRANCH SUPPLYING THE RENAL ANGIOMYOLIPOMA MEDICATIONS: 1% LIDOCAINE LOCAL. ANESTHESIA/SEDATION: Moderate (conscious) sedation was employed during this procedure. A total of Versed 2.0 mg and Fentanyl 100 mcg was administered intravenously by the radiology nurse. Total intra-service moderate Sedation Time: 56 minutes. The patient's level of consciousness and vital signs were monitored continuously by radiology nursing throughout the procedure under my direct supervision. CONTRAST:  76 cc omni 300 FLUOROSCOPY: Radiation Exposure Index (as provided by the fluoroscopic device): 1,414 MGy Kerma COMPLICATIONS: None immediate. PROCEDURE: Informed consent was obtained from the patient following explanation of the procedure, risks, benefits and alternatives. The patient understands, agrees and consents for the procedure. All questions were addressed. A time out was performed prior to the initiation of the procedure. Maximal barrier sterile technique utilized including caps, mask, sterile gowns, sterile gloves, large sterile drape, hand hygiene, and Betadine prep.  Under sterile conditions and local anesthesia, ultrasound micropuncture access performed of the right common femoral artery. Images obtained for documentation of the patent right common femoral artery. Five French sheath inserted over a Bentson guidewire. Initially, a C2 catheter was utilized to select the left renal artery. Initial left renal angiogram performed. Left renal artery origin is widely patent. Minor nonocclusive atherosclerotic change of the main renal artery. Peripheral branches are patent. Previous coil embolization noted within the mid to upper pole region. Initial angiogram demonstrates trace abnormal vascularity along the left kidney lateral cortical margin suspicious for an area of bleeding versus tumor vascularity. Catheter exchangeD for a chung 2.5 catheter. This was reformed in the thoracic aorta and retracted to select the left renal artery. Additional left renal angiograms performed in oblique projections. Again, from the mid pole  laterally there is abnormal vascularity outside of the renal parenchymal phase concerning for an area of arterial bleeding when correlated with CTA. Through the chung 2.5 catheter, the Renegade STC catheter was advanced over a fathom 14 guidewire initially into an upper pole segmental branch. Angiogram within this branch demonstrates normal parenchymal staining without tumor vascularity. Microcatheter retracted and utilized to select an additional segmental branch to the upper pole. Angiogram within this second branch demonstrates normal renal vascularity as well. Microcatheter retracted and advanced over a GT Glidewire into mid pole segmental branches. Additional angiogram demonstrates normal renal vascularity. Faint tumor vascularity still visualized laterally. Over the fathom 14 guidewire, eventually the microcatheter was advanced into an interpolar branch close to the existing microcoils. Angiogram within this segmental branch demonstrates enlarged tortuous  tumor vascularity with contrast extravasation into the surrounding retroperitoneal space. This correlates with the CTA. From this location, embolization was performed. Embolization: Through the Renegade STC catheter, 3 cc of 300-500 micron embospheres were slowly instilled under intermittent fluoroscopy watching for reflux. Once reflux was visualized, a single 3mm x 4 cm Azur CX detachable microcoil was successfully deployed within the interpolar abnormal segmental branch. After approximately 5 minutes, repeat angiogram performed through the microcatheter within the branch. Following embolization this branch is occluded and the abnormal vascularity to the angiomyolipoma is no longer visualized. Microcatheter removed. Final renal angiogram performed through the 5 French Chung catheter. This confirms preservation of the main renal artery supply to the left kidney. No additional abnormal vascularity visualized. Injection of the right common femoral artery sheath confirms adequate access for closure device. Of note, there is beaded appearance of the right external iliac artery compatible with nonocclusive fibromuscular dysplasia. Right common femoral, proximal profunda femoral, proximal superficial femoral arteries are all patent. Successful deployment of the CELT device for hemostasis. 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 Electronically Signed   By: Judie Petit.  Shick M.D.   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 Remote embolization 2018. EXAM: ULTRASOUND GUIDANCE FOR VASCULAR ACCESS LEFT RENAL ANGIOGRAM PERIPHERAL MICRO CATHETERIZATIONS AND ANGIOGRAMS OF 3 ADDITIONAL SEGMENTAL BRANCHES TO THE  LEFT KIDNEY UPPER POLE PERIPHERAL EMBOLIZATION OF AN ABNORMAL LEFT INTERPOLE SEGMENTAL BRANCH SUPPLYING THE RENAL ANGIOMYOLIPOMA MEDICATIONS: 1% LIDOCAINE LOCAL. ANESTHESIA/SEDATION: Moderate (conscious) sedation was employed during this procedure. A total of Versed 2.0 mg and Fentanyl 100 mcg was administered intravenously by the radiology nurse. Total intra-service moderate Sedation Time: 56 minutes. The patient's level of consciousness and vital signs were monitored continuously by radiology nursing throughout the procedure under my direct supervision. CONTRAST:  76 cc omni 300 FLUOROSCOPY: Radiation Exposure Index (as provided by the fluoroscopic device): 1,414 MGy Kerma COMPLICATIONS: None immediate. PROCEDURE: Informed consent was obtained from the patient following explanation of the procedure, risks, benefits and alternatives. The patient understands, agrees and consents for the procedure. All questions were addressed. A time out was performed prior to the initiation of the procedure. Maximal barrier sterile technique utilized including caps, mask, sterile gowns, sterile gloves, large sterile drape, hand hygiene, and Betadine prep. Under sterile conditions and local anesthesia, ultrasound micropuncture access performed of the right common femoral artery. Images obtained for documentation of the patent right common femoral artery. Five French sheath inserted over a Bentson guidewire. Initially, a  C2 catheter was utilized to select the left renal artery. Initial left renal angiogram performed. Left renal artery origin is widely patent. Minor nonocclusive atherosclerotic change of the main renal artery. Peripheral branches are patent. Previous coil embolization noted within the mid to upper pole region. Initial angiogram demonstrates trace abnormal vascularity along the left kidney lateral cortical margin suspicious for an area of bleeding versus tumor vascularity. Catheter exchangeD for a chung 2.5 catheter.  This was reformed in the thoracic aorta and retracted to select the left renal artery. Additional left renal angiograms performed in oblique projections. Again, from the mid pole laterally there is abnormal vascularity outside of the renal parenchymal phase concerning for an area of arterial bleeding when correlated with CTA. Through the chung 2.5 catheter, the Renegade STC catheter was advanced over a fathom 14 guidewire initially into an upper pole segmental branch. Angiogram within this branch demonstrates normal parenchymal staining without tumor vascularity. Microcatheter retracted and utilized to select an additional segmental branch to the upper pole. Angiogram within this second branch demonstrates normal renal vascularity as well. Microcatheter retracted and advanced over a GT Glidewire into mid pole segmental branches. Additional angiogram demonstrates normal renal vascularity. Faint tumor vascularity still visualized laterally. Over the fathom 14 guidewire, eventually the microcatheter was advanced into an interpolar branch close to the existing microcoils. Angiogram within this segmental branch demonstrates enlarged tortuous tumor vascularity with contrast extravasation into the surrounding retroperitoneal space. This correlates with the CTA. From this location, embolization was performed. Embolization: Through the Renegade STC catheter, 3 cc of 300-500 micron embospheres were slowly instilled under intermittent fluoroscopy watching for reflux. Once reflux was visualized, a single 3mm x 4 cm Azur CX detachable microcoil was successfully deployed within the interpolar abnormal segmental branch. After approximately 5 minutes, repeat angiogram performed through the microcatheter within the branch. Following embolization this branch is occluded and the abnormal vascularity to the angiomyolipoma is no longer visualized. Microcatheter removed. Final renal angiogram performed through the 5 French Chung  catheter. This confirms preservation of the main renal artery supply to the left kidney. No additional abnormal vascularity visualized. Injection of the right common femoral artery sheath confirms adequate access for closure device. Of note, there is beaded appearance of the right external iliac artery compatible with nonocclusive fibromuscular dysplasia. Right common femoral, proximal profunda femoral, proximal superficial femoral arteries are all patent. Successful deployment of the CELT device for hemostasis. 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 Electronically Signed   By: Judie Petit.  Shick M.D.   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 Remote embolization 2018. EXAM: ULTRASOUND GUIDANCE FOR VASCULAR ACCESS LEFT RENAL ANGIOGRAM PERIPHERAL MICRO CATHETERIZATIONS AND ANGIOGRAMS OF 3 ADDITIONAL SEGMENTAL BRANCHES TO THE LEFT KIDNEY UPPER POLE PERIPHERAL EMBOLIZATION OF AN ABNORMAL LEFT INTERPOLE SEGMENTAL BRANCH SUPPLYING THE RENAL ANGIOMYOLIPOMA MEDICATIONS: 1% LIDOCAINE LOCAL. ANESTHESIA/SEDATION: Moderate (conscious) sedation was employed during this procedure. A total of Versed 2.0 mg and Fentanyl 100 mcg was administered intravenously by the radiology nurse. Total intra-service moderate Sedation Time: 56 minutes. The patient's level of consciousness and vital signs were monitored continuously by radiology nursing throughout the procedure under my direct supervision. CONTRAST:  76 cc omni 300 FLUOROSCOPY: Radiation Exposure Index (as provided by the fluoroscopic device): 1,414 MGy  Kerma COMPLICATIONS: None immediate. PROCEDURE: Informed consent was obtained from the patient following  explanation of the procedure, risks, benefits and alternatives. The patient understands, agrees and consents for the procedure. All questions were addressed. A time out was performed prior to the initiation of the procedure. Maximal barrier sterile technique utilized including caps, mask, sterile gowns, sterile gloves, large sterile drape, hand hygiene, and Betadine prep. Under sterile conditions and local anesthesia, ultrasound micropuncture access performed of the right common femoral artery. Images obtained for documentation of the patent right common femoral artery. Five French sheath inserted over a Bentson guidewire. Initially, a C2 catheter was utilized to select the left renal artery. Initial left renal angiogram performed. Left renal artery origin is widely patent. Minor nonocclusive atherosclerotic change of the main renal artery. Peripheral branches are patent. Previous coil embolization noted within the mid to upper pole region. Initial angiogram demonstrates trace abnormal vascularity along the left kidney lateral cortical margin suspicious for an area of bleeding versus tumor vascularity. Catheter exchangeD for a chung 2.5 catheter. This was reformed in the thoracic aorta and retracted to select the left renal artery. Additional left renal angiograms performed in oblique projections. Again, from the mid pole laterally there is abnormal vascularity outside of the renal parenchymal phase concerning for an area of arterial bleeding when correlated with CTA. Through the chung 2.5 catheter, the Renegade STC catheter was advanced over a fathom 14 guidewire initially into an upper pole segmental branch. Angiogram within this branch demonstrates normal parenchymal staining without tumor vascularity. Microcatheter retracted and utilized to select an additional segmental branch to the upper pole. Angiogram within this second branch demonstrates normal renal vascularity as well. Microcatheter retracted and  advanced over a GT Glidewire into mid pole segmental branches. Additional angiogram demonstrates normal renal vascularity. Faint tumor vascularity still visualized laterally. Over the fathom 14 guidewire, eventually the microcatheter was advanced into an interpolar branch close to the existing microcoils. Angiogram within this segmental branch demonstrates enlarged tortuous tumor vascularity with contrast extravasation into the surrounding retroperitoneal space. This correlates with the CTA. From this location, embolization was performed. Embolization: Through the Renegade STC catheter, 3 cc of 300-500 micron embospheres were slowly instilled under intermittent fluoroscopy watching for reflux. Once reflux was visualized, a single 3mm x 4 cm Azur CX detachable microcoil was successfully deployed within the interpolar abnormal segmental branch. After approximately 5 minutes, repeat angiogram performed through the microcatheter within the branch. Following embolization this branch is occluded and the abnormal vascularity to the angiomyolipoma is no longer visualized. Microcatheter removed. Final renal angiogram performed through the 5 French Chung catheter. This confirms preservation of the main renal artery supply to the left kidney. No additional abnormal vascularity visualized. Injection of the right common femoral artery sheath confirms adequate access for closure device. Of note, there is beaded appearance of the right external iliac artery compatible with nonocclusive fibromuscular dysplasia. Right common femoral, proximal profunda femoral, proximal superficial femoral arteries are all patent. Successful deployment of the CELT device for hemostasis. 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 Electronically Signed   By: Judie Petit.   Shick M.D.   On: 03/11/2023 08:19   CT ABDOMEN PELVIS W CONTRAST  Result Date: 03/10/2023 CLINICAL DATA:  Acute generalized abdominal pain. EXAM: CT ABDOMEN AND PELVIS WITH CONTRAST TECHNIQUE: Multidetector CT imaging of the abdomen and pelvis was performed  using the standard protocol following bolus administration of intravenous contrast. RADIATION DOSE REDUCTION: This exam was performed according to the departmental dose-optimization program which includes automated exposure control, adjustment of the mA and/or kV according to patient size and/or use of iterative reconstruction technique. CONTRAST:  OMNIPAQUE IOHEXOL 300 MG/ML  SOLN COMPARISON:  September 15, 2022. FINDINGS: Lower chest: No acute abnormality. Hepatobiliary: No cholelithiasis or biliary dilatation is noted. Multiple hepatic cysts are again noted. Pancreas: Unremarkable. No pancreatic ductal dilatation or surrounding inflammatory changes. Spleen: Normal in size without focal abnormality. Adrenals/Urinary Tract: Adrenal glands are unremarkable. Small nonobstructive right renal calculus is noted. No hydronephrosis. Urinary bladder is unremarkable. The patient is status post embolization of left renal angiomyolipoma according to prior exam. However, there is now interval development of large subcapsular hematoma and associated left perirenal hematoma superior to the left kidney measuring 7.3 x 5.3 cm. There does appear to be active extravasation of contrast suggesting active hemorrhage. Stomach/Bowel: Stomach is within normal limits. Appendix appears normal. No evidence of bowel wall thickening, distention, or inflammatory changes. Sigmoid diverticulosis without inflammation. Vascular/Lymphatic: Aortic atherosclerosis. No enlarged abdominal or pelvic lymph nodes. Reproductive: Uterus is unremarkable. Stable right adnexal mass is noted with calcifications, most consistent with fibroid as noted on prior studies. Other: No ascites or hernia is  noted. Musculoskeletal: Old T12, L2 and L4 fractures are noted. No acute osseous abnormality is noted. IMPRESSION: Patient is reportedly status post embolization of left renal angiomyolipoma according to prior exam. There is now interval development of large left renal subcapsular hematoma and associated left pararenal hematoma superior to the left kidney, active extravasation of contrast suggesting active hemorrhage. Critical Value/emergent results were called by telephone at the time of interpretation on 03/10/2023 at 4:08 pm to provider Dr. Charm Barges, who verbally acknowledged these results. Sigmoid diverticulosis without inflammation. Aortic Atherosclerosis (ICD10-I70.0). Electronically Signed   By: Lupita Raider M.D.   On: 03/10/2023 16:10    Labs:  CBC: Recent Labs    03/11/23 1752 03/11/23 2159 03/12/23 0418 03/12/23 0726 03/12/23 1444 03/12/23 1445 03/12/23 2309 03/13/23 0529 03/13/23 0742 03/13/23 1230  WBC 13.2*  --  12.1*  --  10.9*  --   --  8.1  --   --   HGB 9.6*   < > 8.2*   < > 7.8*   < > 7.2* 7.5* 7.9* 7.6*  HCT 28.3*   < > 23.7*   < > 23.8*   < > 21.1* 21.8* 23.4* 22.7*  PLT 142*  --  116*  --  124*  --   --  111*  --   --    < > = values in this interval not displayed.    COAGS: Recent Labs    03/10/23 1627 03/11/23 0400  INR 1.2 1.2  APTT 27 26    BMP: Recent Labs    03/11/23 1752 03/12/23 0418 03/12/23 1444 03/13/23 0529  NA 135 134* 134* 137  K 3.9 3.6 3.4* 3.4*  CL 103 101 101 104  CO2 22 24 26 25   GLUCOSE 157* 114* 122* 106*  BUN 34* 26* 23 14  CALCIUM 8.4* 8.0* 8.4* 8.3*  CREATININE 1.28* 0.79 0.98 0.68  GFRNONAA 43* >60 60* >60    LIVER FUNCTION TESTS: Recent Labs    03/11/23 1752 03/12/23 0418 03/12/23 1444 03/13/23 0529  BILITOT 0.5 1.0 0.8 1.1  AST 82* 73* 55* 40  ALT 60* 61* 55* 46*  ALKPHOS 41 36* 37* 39  PROT 5.9* 5.3* 5.3* 5.3*  ALBUMIN 3.5 3.1* 3.0* 2.9*    Assessment and Plan:  Left renal angiomyolipoma hemorrhage  s/p coil embolization in IR 03/10/23  Patient has had an expected drop in hemoglobin levels since embolization but the values appear to have stabilized. IR anticipates her hemoglobin levels will normalize over the next few weeks. Her vital signs are within an acceptable range and the patient denies any significant pain or discomfort. IR does not recommend additional imaging at this time and we think discharge home is an appropriate plan. IR recommends the patient restart Eliquis next Wednesday 03/18/23.   Dr. Loreta Ave met with the patient at the bedside to discuss the above mentioned information. Time allowed for further discussions about the nature of angiomyolipomas, future bleeding risk, and risk versus benefit of anticoagulation therapy. The patient expressed a desire to stop anticoagulation all together and we encouraged her to have a discussion with her cardiologist, Dr. Eldridge Dace.   Mrs. Jerue will follow up outpatient with IR in approximately 10-14 days. A scheduler from our office will call her with a date/time of her appointment. She knows she can call our office with any questions prior to her appointment.    Electronically Signed: Alwyn Ren, AGACNP-BC [phone] 03/13/2023, 3:06 PM   I spent a total of 25 Minutes at the the patient's bedside AND on the patient's hospital floor or unit, greater than 50% of which was counseling/coordinating care for left renal angiomyolipoma embolization

## 2023-03-13 NOTE — Discharge Summary (Signed)
Physician Discharge Summary   Patient: Ann Vang MRN: [id number] DOB: February 23, 1946  Admit date:     03/10/2023  Discharge date: 03/13/2023  Discharge Physician: Marguerita Merles, DO   PCP: Alysia Penna, MD   Recommendations at discharge:   Follow-up with PCP within 1 to 2 weeks and repeat CBC, CMP, mag, Phos within 1 week Follow-up with urology in outpatient setting at [facility] Follow-up with interventional radiology within 4 weeks Hold anticoagulation and resume on Wednesday of next week but discuss resumption with cardiologist Dr. Eldridge Dace Follow-up with cardiology outpatient setting  Discharge Diagnoses: Principal Problem:   Renal hematoma, left Active Problems:   Angiomyolipoma of kidney   Left renal hematoma with extravasation s/p embolectomy 03/10/2023   Paroxysmal atrial fibrillation on Eliquis (HCC)   Diverticulosis   Hypokalemia   Hyponatremia   Hematoma  Resolved Problems:   * No resolved hospital problems. *  Hospital Course: Patient is a 77 year old Caucasian female with a past medical history significant for but limited to angiolipoma of the kidney, atrial fibrillation on anticoagulation with Eliquis, diverticulosis, chronic arthritis as well as other comorbidities who presented to the emergency department at [facility] around 2 PM complaining of abdominal pain.  She started to have abdominal pain around noon and is located in the lower abdomen and epigastric area.  She described it as a sharp constant nonradiating and endorsed nausea vomiting.  States that she ate an egg in the morning that she peeled approximately 4 days prior.  Last bowel movement was in the morning.  She saw blood in her urine and states that before her given that she is a lesion tumor in her kidney.  Further workup in the ED was done with a CT scan of the abdomen pelvis which showed a left renal hematoma with active extravasation.  Interventional radiology was consulted and  she was given Kcentra for Eliquis reversal and transferred ED to ED to [facility] and underwent a left renal angiogram and peripheral branch embolization and a 3 mm coil placement by Dr. Denny Levy in the evening.  She is being admitted and being observed to ensure that she has no further bleeding and trending her hemoglobin/hematocrit.  PT and OT evaluated and recommending no follow-up.    Interventional radiology evaluated as well as urology and they cleared the patient for discharge.  Her hemoglobin stabilized and she is medically stable for discharge at this time will need to follow-up with PCP, cardiology, interventional radiology and urology in outpatient setting.  She was told to avoid her anticoagulation till next Wednesday but she will need to discuss with cardiology about resumption.  Assessment and Plan:  Left subcapsular hematoma s/p embolectomy 03/10/2023 History of left renal angiolipoma s/p embolization 2018 -Patient coming with complaining of right-sided abdominal pain.  Patient is hemodynamically stable. -CT abdomen pelvis showed status post embolization of left renal angiomyolipoma according to prior exam. There is now interval development of large left renal subcapsular hematoma and associated left pararenal hematoma superior to the left kidney, active extravasation of contrast suggesting active hemorrhage. Critical Value/emergent results were called by telephone at the time of interpretation on 03/10/2023 at 4:08 pm to provider Dr. Charm Barges, who verbally acknowledged these results. -Interventional radiology consulted Dr. Joanne Gavel, I have recommended to keep Kcentra for Eliquis reversal and patient was transfer from ED to ED at [facility].  Patient underwent left renal angiogram and peripheral branch embolization and 3 mm coil placement by interventional radiologist Dr. Miles Costain  around 7:44 PM 7/23. -Postembolization patient is hemodynamically stable -Initial presentation CBC showed  hemoglobin 14 and repeat H&H after 6 hours showed hemoglobin 12.6.  Continue to monitor H&H every 6 hours.  Will transfuse if hemoglobin drops very quickly on next H&H check. -Hgb/Hct Trend:  Recent Labs  Lab 03/12/23 1444 03/12/23 1445 03/12/23 1901 03/12/23 2309 03/13/23 0529 03/13/23 0742 03/13/23 1230  HGB 7.8* 7.9* 7.4* 7.2* 7.5* 7.9* 7.6*  HCT 23.8* 23.4* 22.1* 21.1* 21.8* 23.4* 22.7*  MCV 95.2  --   --   --  96.9  --   --   -Stat type and screen, prepared 2 unit of blood in case patient needs transfusion. -Patient received Kcentra at [facility] per pharmacy -If patient develop any significant abdominal pain or hemoglobin drop low threshold to quickly will do stat CT scan of the abdomen -Continue hydralazine 10 mg every 6 hours as needed for good blood pressure control to prevent any bleeding. -Continue morphine 2 mg every 2 hours as needed for pain -Admitted patient to the Telemetry Unit -Obtain PT/OT to evaluate and Treat and recommending no follow up -Advance Diet as Tolerated -Consulted Urology for their opinion recommending no acute urological intervention recommended continue to trend hemoglobin.  If there is concern for acute bleed they are recommending discussing with IR about repeat imaging with CT and possible repeat embolization.  She need to follow-up with her outpatient urologist and she follows with Dr. Earlene Plater at [facility] -Interventional radiology evaluated and felt no hesitation to discharge her home and hemoglobin appears to have stabilized -She will follow-up with urology and interventional radiology in outpatient setting and she was told to hold her anticoagulation until next Wednesday but still need to follow-up with her cardiologist  Paroxysmal Atrial Fibrillation -At home patient is on Eliquis 5 mg twice daily for last 2 years for proximal afibrillation. -CHA2DS2-VASc score = 3, places patient to high risk of stroke -Resuming oral amiodarone 100 mg  daily. -Bedside cardiac monitoring showed normal sinus rhythm heart rate around 80 -In the setting of intra-abdominal bleeding will be holding Eliquis. -Continue to monitor H&H closely and will resume as appropriate -Discussed with Cardiology Dr. Mayford Knife who recommends resuming Anticoagulation when ok with IR but will continue to Hold given Drop in Hgb/Hct; IR recommends holding anticoagulation until next Wednesday but will still need discussion with Dr. Eldridge Dace in outpatient setting  Metabolic Acidosis, improved -Mild. In the setting of Above -Patient's CO2 on Admission was 18, AG was 12, Chloride Level was 106; Now AG is 8, Chloride Level is 104 and AG is 25 -Continue to Monitor and Trend and repeat CMP within 1 week  Abnormal LFTs, mild and improved -Likely mildly reactive -LFT Trend: Recent Labs  Lab 03/10/23 1359 03/10/23 2200 03/11/23 0400 03/11/23 1752 03/12/23 0418 03/12/23 1444 03/13/23 0529  AST 22 21 22  82* 73* 55* 40  ALT 17 14 14  60* 61* 55* 46*  -Continue to Monitor and Trend and Repeat CMP within 1 week   Hypokalemia -> Hyperkalemia -> hypokalemia -Patient's K+ Level Trend: Recent Labs  Lab 03/10/23 1359 03/10/23 2200 03/11/23 0400 03/11/23 1752 03/12/23 0418 03/12/23 1444 03/13/23 0529  K 3.4* 5.2* 5.3* 3.9 3.6 3.4* 3.4*  -Repleted prior to discharge -Continue to Monitor and Replete as Necessary -Repeat CMP within 1 week   Euvolemic Hyponatremia -Na+ Trend: Recent Labs  Lab 03/10/23 1359 03/10/23 2200 03/11/23 0400 03/11/23 1752 03/12/23 0418 03/12/23 1444 03/13/23 0529  NA 134* 136  135 135 134* 134* 137  -Likely secondary from activation of antidiuretic hormone in the setting of abdominal pain. -Continue to monitor sodium level and repeat CMP within 1 week   History of Diverticulosis -Stable   Consultants: Interventional radiology, urology Procedures performed: left renal angiogram and peripheral branch embolization and a 3 mm coil  placement by Dr. Denny Levy   Disposition: Home Diet recommendation:  Discharge Diet Orders (From admission, onward)     Start     Ordered   03/13/23 0000  Diet - low sodium heart healthy        03/13/23 1512           Cardiac diet DISCHARGE MEDICATION: Allergies as of 03/13/2023   No Known Allergies      Medication List     TAKE these medications    amiodarone 200 MG tablet Commonly known as: PACERONE Take 0.5 tablets (100 mg total) by mouth daily.   apixaban 5 MG Tabs tablet Commonly known as: Eliquis Take 1 tablet (5 mg total) by mouth 2 (two) times daily. Speak With Cardiology Dr. Eldridge Dace about resuming Start taking on: March 18, 2023 What changed:  additional instructions These instructions start on March 18, 2023. If you are unsure what to do until then, ask your doctor or other care provider.   estradiol 0.0375 MG/24HR Commonly known as: VIVELLE-DOT Place 1 patch onto the skin 2 (two) times a week.   fluticasone 50 MCG/ACT nasal spray Commonly known as: FLONASE Place 2 sprays into the nose daily. What changed:  when to take this reasons to take this   magnesium gluconate 500 MG tablet Commonly known as: MAGONATE Take 500 mg by mouth 2 (two) times daily.   milk thistle 175 MG tablet Take 175 mg by mouth daily.   OVER THE COUNTER MEDICATION Take 1 tablet by mouth daily at 6 (six) AM. Medication: Vitamin K7   Probiotic 1-250 BILLION-MG Caps Take 1 capsule by mouth daily.   progesterone 100 MG capsule Commonly known as: PROMETRIUM TAKE ONE CAPSULE BY MOUTH DAILY   Vitamin D 50 MCG (2000 UT) tablet Take 2,000 Units by mouth daily.               Discharge Care Instructions  (From admission, onward)           Start     Ordered   03/13/23 0000  Discharge wound care:       Comments: Per IR   03/13/23 1512            Follow-up Information     [facility] Follow up.   Why: Please follow up with [facility] in 10-14 days. A scheduler from our office will call you with a date/time of your appointment. Please call our clinic with any questions prior to your visit. Contact information: [address] [place] [address] [phone]                Discharge Exam: Filed Weights   03/10/23 1354 03/11/23 1601 03/13/23 0451  Weight: 74.8 kg 72.6 kg 74.8 kg   Vitals:   03/13/23 0451 03/13/23 0904  BP: (!) 142/62 (!) 143/64  Pulse: 82 79  Resp: 20 19  Temp: 98.5 F (36.9 C) 98.1 F (36.7 C)  SpO2: 94% 97%   Examination: Physical Exam:  Constitutional: WN/WD overweight Caucasian female in no acute distress Respiratory: Diminished to auscultation bilaterally, no wheezing, rales, rhonchi or crackles. Normal respiratory effort  and patient is not tachypenic. No accessory muscle use.  Unlabored breathing Cardiovascular: RRR, no murmurs / rubs / gallops. S1 and S2 auscultated. No extremity edema. Abdomen: Soft, mildly-tender, non-distended.  Bowel sounds positive.  GU: Deferred. Musculoskeletal: No clubbing / cyanosis of digits/nails. No joint deformity upper and lower extremities.  Skin: No rashes, lesions, ulcers, on a limited skin evaluation. No induration; Warm and dry.  Neurologic: CN 2-12 grossly intact with no focal deficits. Romberg sign and cerebellar reflexes not assessed.  Psychiatric: Normal judgment and insight. Alert and oriented x 3. Normal mood and appropriate affect.   Condition at discharge: stable  The results of significant diagnostics from this hospitalization (including imaging, microbiology, ancillary and laboratory) are listed below for reference.   Imaging Studies: IR US Guide Vasc Access Right  Result Date: 03/11/2023 INDICATION: Acute arterial bleeding from a known large left renal angiomyolipoma. Remote embolization 2018. EXAM: ULTRASOUND GUIDANCE FOR VASCULAR ACCESS LEFT RENAL ANGIOGRAM PERIPHERAL MICRO CATHETERIZATIONS AND ANGIOGRAMS OF 3  ADDITIONAL SEGMENTAL BRANCHES TO THE LEFT KIDNEY UPPER POLE PERIPHERAL EMBOLIZATION OF AN ABNORMAL LEFT INTERPOLE SEGMENTAL BRANCH SUPPLYING THE RENAL ANGIOMYOLIPOMA MEDICATIONS: 1% LIDOCAINE LOCAL. ANESTHESIA/SEDATION: Moderate (conscious) sedation was employed during this procedure. A total of Versed 2.0 mg and Fentanyl 100 mcg was administered intravenously by the radiology nurse. Total intra-service moderate Sedation Time: 56 minutes. The patient's level of consciousness and vital signs were monitored continuously by radiology nursing throughout the procedure under my direct supervision. CONTRAST:  76 cc omni 300 FLUOROSCOPY: Radiation Exposure Index (as provided by the fluoroscopic device): 1,414 MGy Kerma COMPLICATIONS: None immediate. PROCEDURE: Informed consent was obtained from the patient following explanation of the procedure, risks, benefits and alternatives. The patient understands, agrees and consents for the procedure. All questions were addressed. A time out was performed prior to the initiation of the procedure. Maximal barrier sterile technique utilized including caps, mask, sterile gowns, sterile gloves, large sterile drape, hand hygiene, and Betadine prep. Under sterile conditions and local anesthesia, ultrasound micropuncture access performed of the right common femoral artery. Images obtained for documentation of the patent right common femoral artery. Five French sheath inserted over a Bentson guidewire. Initially, a C2 catheter was utilized to select the left renal artery. Initial left renal angiogram performed. Left renal artery origin is widely patent. Minor nonocclusive atherosclerotic change of the main renal artery. Peripheral branches are patent. Previous coil embolization noted within the mid to upper pole region. Initial angiogram demonstrates trace abnormal vascularity along the left kidney lateral cortical margin suspicious for an area of bleeding versus tumor vascularity. Catheter  exchangeD for a chung 2.5 catheter. This was reformed in the thoracic aorta and retracted to select the left renal artery. Additional left renal angiograms performed in oblique projections. Again, from the mid pole laterally there is abnormal vascularity outside of the renal parenchymal phase concerning for an area of arterial bleeding when correlated with CTA. Through the chung 2.5 catheter, the Renegade STC catheter was advanced over a fathom 14 guidewire initially into an upper pole segmental branch. Angiogram within this branch demonstrates normal parenchymal staining without tumor vascularity. Microcatheter retracted and utilized to select an additional segmental branch to the upper pole. Angiogram within this second branch demonstrates normal renal vascularity as well. Microcatheter retracted and advanced over a GT Glidewire into mid pole segmental branches. Additional angiogram demonstrates normal renal vascularity. Faint tumor vascularity still visualized laterally. Over the fathom 14 guidewire, eventually the microcatheter was advanced into an interpolar branch close to the  existing microcoils. Angiogram within this segmental branch demonstrates enlarged tortuous tumor vascularity with contrast extravasation into the surrounding retroperitoneal space. This correlates with the CTA. From this location, embolization was performed. Embolization: Through the Renegade STC catheter, 3 cc of 300-500 micron embospheres were slowly instilled under intermittent fluoroscopy watching for reflux. Once reflux was visualized, a single 3mm x 4 cm Azur CX detachable microcoil was successfully deployed within the interpolar abnormal segmental branch. After approximately 5 minutes, repeat angiogram performed through the microcatheter within the branch. Following embolization this branch is occluded and the abnormal vascularity to the angiomyolipoma is no longer visualized. Microcatheter removed. Final renal angiogram performed  through the 5 French Chung catheter. This confirms preservation of the main renal artery supply to the left kidney. No additional abnormal vascularity visualized. Injection of the right common femoral artery sheath confirms adequate access for closure device. Of note, there is beaded appearance of the right external iliac artery compatible with nonocclusive fibromuscular dysplasia. Right common femoral, proximal profunda femoral, proximal superficial femoral arteries are all patent. Successful deployment of the CELT device for hemostasis. 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 Electronically Signed   By: Judie Petit.  Shick M.D.   On: 03/11/2023 08:19   IR Angiogram Renal Left Selective  Result Date: 03/11/2023 INDICATION: Acute arterial bleeding from a known large left renal angiomyolipoma. Remote embolization 2018. EXAM: ULTRASOUND GUIDANCE FOR VASCULAR ACCESS LEFT RENAL ANGIOGRAM PERIPHERAL MICRO CATHETERIZATIONS AND ANGIOGRAMS OF 3 ADDITIONAL SEGMENTAL BRANCHES TO THE LEFT KIDNEY UPPER POLE PERIPHERAL EMBOLIZATION OF AN ABNORMAL LEFT INTERPOLE SEGMENTAL BRANCH SUPPLYING THE RENAL ANGIOMYOLIPOMA MEDICATIONS: 1% LIDOCAINE LOCAL. ANESTHESIA/SEDATION: Moderate (conscious) sedation was employed during this procedure. A total of Versed 2.0 mg and Fentanyl 100 mcg was administered intravenously by the radiology nurse. Total intra-service moderate Sedation Time: 56 minutes. The patient's level of consciousness and vital signs were monitored continuously by radiology nursing throughout the procedure under my direct supervision. CONTRAST:  76 cc omni 300 FLUOROSCOPY: Radiation Exposure Index (as provided by the fluoroscopic device): 1,414 MGy Kerma COMPLICATIONS: None immediate. PROCEDURE: Informed consent was obtained from the patient  following explanation of the procedure, risks, benefits and alternatives. The patient understands, agrees and consents for the procedure. All questions were addressed. A time out was performed prior to the initiation of the procedure. Maximal barrier sterile technique utilized including caps, mask, sterile gowns, sterile gloves, large sterile drape, hand hygiene, and Betadine prep. Under sterile conditions and local anesthesia, ultrasound micropuncture access performed of the right common femoral artery. Images obtained for documentation of the patent right common femoral artery. Five French sheath inserted over a Bentson guidewire. Initially, a C2 catheter was utilized to select the left renal artery. Initial left renal angiogram performed. Left renal artery origin is widely patent. Minor nonocclusive atherosclerotic change of the main renal artery. Peripheral branches are patent. Previous coil embolization noted within the mid to upper pole region. Initial angiogram demonstrates trace abnormal vascularity along the left kidney lateral cortical margin suspicious for an area of bleeding versus tumor vascularity. Catheter exchangeD for a chung 2.5 catheter. This was reformed in the thoracic aorta and retracted to select the left renal artery. Additional left renal angiograms performed in oblique projections. Again, from the mid pole laterally there is abnormal vascularity outside of the renal parenchymal phase concerning for an area of arterial bleeding when correlated with  CTA. Through the chung 2.5 catheter, the Renegade STC catheter was advanced over a fathom 14 guidewire initially into an upper pole segmental branch. Angiogram within this branch demonstrates normal parenchymal staining without tumor vascularity. Microcatheter retracted and utilized to select an additional segmental branch to the upper pole. Angiogram within this second branch demonstrates normal renal vascularity as well. Microcatheter retracted  and advanced over a GT Glidewire into mid pole segmental branches. Additional angiogram demonstrates normal renal vascularity. Faint tumor vascularity still visualized laterally. Over the fathom 14 guidewire, eventually the microcatheter was advanced into an interpolar branch close to the existing microcoils. Angiogram within this segmental branch demonstrates enlarged tortuous tumor vascularity with contrast extravasation into the surrounding retroperitoneal space. This correlates with the CTA. From this location, embolization was performed. Embolization: Through the Renegade STC catheter, 3 cc of 300-500 micron embospheres were slowly instilled under intermittent fluoroscopy watching for reflux. Once reflux was visualized, a single 3mm x 4 cm Azur CX detachable microcoil was successfully deployed within the interpolar abnormal segmental branch. After approximately 5 minutes, repeat angiogram performed through the microcatheter within the branch. Following embolization this branch is occluded and the abnormal vascularity to the angiomyolipoma is no longer visualized. Microcatheter removed. Final renal angiogram performed through the 5 French Chung catheter. This confirms preservation of the main renal artery supply to the left kidney. No additional abnormal vascularity visualized. Injection of the right common femoral artery sheath confirms adequate access for closure device. Of note, there is beaded appearance of the right external iliac artery compatible with nonocclusive fibromuscular dysplasia. Right common femoral, proximal profunda femoral, proximal superficial femoral arteries are all patent. Successful deployment of the CELT device for hemostasis. 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 Electronically Signed   By:  Judie Petit.  Shick M.D.   On: 03/11/2023 08:19   IR EMBO ART  VEN HEMORR LYMPH EXTRAV  INC GUIDE ROADMAPPING  Result Date: 03/11/2023 INDICATION: Acute arterial bleeding from a known large left renal angiomyolipoma. Remote embolization 2018. EXAM: ULTRASOUND GUIDANCE FOR VASCULAR ACCESS LEFT RENAL ANGIOGRAM PERIPHERAL MICRO CATHETERIZATIONS AND ANGIOGRAMS OF 3 ADDITIONAL SEGMENTAL BRANCHES TO THE LEFT KIDNEY UPPER POLE PERIPHERAL EMBOLIZATION OF AN ABNORMAL LEFT INTERPOLE SEGMENTAL BRANCH SUPPLYING THE RENAL ANGIOMYOLIPOMA MEDICATIONS: 1% LIDOCAINE LOCAL. ANESTHESIA/SEDATION: Moderate (conscious) sedation was employed during this procedure. A total of Versed 2.0 mg and Fentanyl 100 mcg was administered intravenously by the radiology nurse. Total intra-service moderate Sedation Time: 56 minutes. The patient's level of consciousness and vital signs were monitored continuously by radiology nursing throughout the procedure under my direct supervision. CONTRAST:  76 cc omni 300 FLUOROSCOPY: Radiation Exposure Index (as provided by the fluoroscopic device): 1,414 MGy Kerma COMPLICATIONS: None immediate. PROCEDURE: Informed consent was obtained from the patient following explanation of the procedure, risks, benefits and alternatives. The patient understands, agrees and consents for the procedure. All questions were addressed. A time out was performed prior to the initiation of the procedure. Maximal barrier sterile technique utilized including caps, mask, sterile gowns, sterile gloves, large sterile drape, hand hygiene, and Betadine prep. Under sterile conditions and local anesthesia, ultrasound micropuncture access performed of the right common femoral artery. Images obtained for documentation of the patent right common femoral artery. Five French sheath inserted over a Bentson guidewire. Initially, a C2 catheter was utilized to select the left renal artery. Initial left renal angiogram performed.  Left renal artery origin is  widely patent. Minor nonocclusive atherosclerotic change of the main renal artery. Peripheral branches are patent. Previous coil embolization noted within the mid to upper pole region. Initial angiogram demonstrates trace abnormal vascularity along the left kidney lateral cortical margin suspicious for an area of bleeding versus tumor vascularity. Catheter exchangeD for a chung 2.5 catheter. This was reformed in the thoracic aorta and retracted to select the left renal artery. Additional left renal angiograms performed in oblique projections. Again, from the mid pole laterally there is abnormal vascularity outside of the renal parenchymal phase concerning for an area of arterial bleeding when correlated with CTA. Through the chung 2.5 catheter, the Renegade STC catheter was advanced over a fathom 14 guidewire initially into an upper pole segmental branch. Angiogram within this branch demonstrates normal parenchymal staining without tumor vascularity. Microcatheter retracted and utilized to select an additional segmental branch to the upper pole. Angiogram within this second branch demonstrates normal renal vascularity as well. Microcatheter retracted and advanced over a GT Glidewire into mid pole segmental branches. Additional angiogram demonstrates normal renal vascularity. Faint tumor vascularity still visualized laterally. Over the fathom 14 guidewire, eventually the microcatheter was advanced into an interpolar branch close to the existing microcoils. Angiogram within this segmental branch demonstrates enlarged tortuous tumor vascularity with contrast extravasation into the surrounding retroperitoneal space. This correlates with the CTA. From this location, embolization was performed. Embolization: Through the Renegade STC catheter, 3 cc of 300-500 micron embospheres were slowly instilled under intermittent fluoroscopy watching for reflux. Once reflux was visualized, a single 3mm x 4 cm Azur CX detachable  microcoil was successfully deployed within the interpolar abnormal segmental branch. After approximately 5 minutes, repeat angiogram performed through the microcatheter within the branch. Following embolization this branch is occluded and the abnormal vascularity to the angiomyolipoma is no longer visualized. Microcatheter removed. Final renal angiogram performed through the 5 French Chung catheter. This confirms preservation of the main renal artery supply to the left kidney. No additional abnormal vascularity visualized. Injection of the right common femoral artery sheath confirms adequate access for closure device. Of note, there is beaded appearance of the right external iliac artery compatible with nonocclusive fibromuscular dysplasia. Right common femoral, proximal profunda femoral, proximal superficial femoral arteries are all patent. Successful deployment of the CELT device for hemostasis. 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 Electronically Signed   By: Judie Petit.  Shick M.D.   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 Remote embolization 2018. EXAM: ULTRASOUND GUIDANCE FOR VASCULAR ACCESS LEFT RENAL ANGIOGRAM PERIPHERAL MICRO CATHETERIZATIONS AND ANGIOGRAMS OF 3 ADDITIONAL SEGMENTAL BRANCHES TO THE LEFT KIDNEY UPPER POLE PERIPHERAL EMBOLIZATION OF AN ABNORMAL LEFT INTERPOLE SEGMENTAL BRANCH SUPPLYING THE RENAL ANGIOMYOLIPOMA MEDICATIONS: 1% LIDOCAINE LOCAL. ANESTHESIA/SEDATION: Moderate (conscious) sedation was employed during this procedure. A total of Versed 2.0 mg and Fentanyl 100 mcg was administered intravenously by the radiology nurse. Total intra-service moderate Sedation Time:  56 minutes. The patient's level of consciousness and vital signs were monitored continuously by radiology nursing throughout the procedure under my direct supervision. CONTRAST:  76 cc omni 300 FLUOROSCOPY: Radiation Exposure Index (as provided by the fluoroscopic device): 1,414 MGy Kerma COMPLICATIONS: None immediate. PROCEDURE: Informed consent was obtained from the patient following explanation  of the procedure, risks, benefits and alternatives. The patient understands, agrees and consents for the procedure. All questions were addressed. A time out was performed prior to the initiation of the procedure. Maximal barrier sterile technique utilized including caps, mask, sterile gowns, sterile gloves, large sterile drape, hand hygiene, and Betadine prep. Under sterile conditions and local anesthesia, ultrasound micropuncture access performed of the right common femoral artery. Images obtained for documentation of the patent right common femoral artery. Five French sheath inserted over a Bentson guidewire. Initially, a C2 catheter was utilized to select the left renal artery. Initial left renal angiogram performed. Left renal artery origin is widely patent. Minor nonocclusive atherosclerotic change of the main renal artery. Peripheral branches are patent. Previous coil embolization noted within the mid to upper pole region. Initial angiogram demonstrates trace abnormal vascularity along the left kidney lateral cortical margin suspicious for an area of bleeding versus tumor vascularity. Catheter exchangeD for a chung 2.5 catheter. This was reformed in the thoracic aorta and retracted to select the left renal artery. Additional left renal angiograms performed in oblique projections. Again, from the mid pole laterally there is abnormal vascularity outside of the renal parenchymal phase concerning for an area of arterial bleeding when correlated with CTA. Through the chung 2.5 catheter, the Renegade STC catheter was  advanced over a fathom 14 guidewire initially into an upper pole segmental branch. Angiogram within this branch demonstrates normal parenchymal staining without tumor vascularity. Microcatheter retracted and utilized to select an additional segmental branch to the upper pole. Angiogram within this second branch demonstrates normal renal vascularity as well. Microcatheter retracted and advanced over a GT Glidewire into mid pole segmental branches. Additional angiogram demonstrates normal renal vascularity. Faint tumor vascularity still visualized laterally. Over the fathom 14 guidewire, eventually the microcatheter was advanced into an interpolar branch close to the existing microcoils. Angiogram within this segmental branch demonstrates enlarged tortuous tumor vascularity with contrast extravasation into the surrounding retroperitoneal space. This correlates with the CTA. From this location, embolization was performed. Embolization: Through the Renegade STC catheter, 3 cc of 300-500 micron embospheres were slowly instilled under intermittent fluoroscopy watching for reflux. Once reflux was visualized, a single 3mm x 4 cm Azur CX detachable microcoil was successfully deployed within the interpolar abnormal segmental branch. After approximately 5 minutes, repeat angiogram performed through the microcatheter within the branch. Following embolization this branch is occluded and the abnormal vascularity to the angiomyolipoma is no longer visualized. Microcatheter removed. Final renal angiogram performed through the 5 French Chung catheter. This confirms preservation of the main renal artery supply to the left kidney. No additional abnormal vascularity visualized. Injection of the right common femoral artery sheath confirms adequate access for closure device. Of note, there is beaded appearance of the right external iliac artery compatible with nonocclusive fibromuscular dysplasia. Right common femoral, proximal profunda  femoral, proximal superficial femoral arteries are all patent. Successful deployment of the CELT device for hemostasis. 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 Electronically Signed   By: Judie Petit.  Shick M.D.   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 Remote embolization 2018. EXAM: ULTRASOUND GUIDANCE FOR VASCULAR ACCESS LEFT RENAL ANGIOGRAM PERIPHERAL MICRO CATHETERIZATIONS AND ANGIOGRAMS OF 3 ADDITIONAL SEGMENTAL BRANCHES TO THE LEFT KIDNEY UPPER POLE  PERIPHERAL EMBOLIZATION OF AN ABNORMAL LEFT INTERPOLE SEGMENTAL BRANCH SUPPLYING THE RENAL ANGIOMYOLIPOMA MEDICATIONS: 1% LIDOCAINE LOCAL. ANESTHESIA/SEDATION: Moderate (conscious) sedation was employed during this procedure. A total of Versed 2.0 mg and Fentanyl 100 mcg was administered intravenously by the radiology nurse. Total intra-service moderate Sedation Time: 56 minutes. The patient's level of consciousness and vital signs were monitored continuously by radiology nursing throughout the procedure under my direct supervision. CONTRAST:  76 cc omni 300 FLUOROSCOPY: Radiation Exposure Index (as provided by the fluoroscopic device): 1,414 MGy Kerma COMPLICATIONS: None immediate. PROCEDURE: Informed consent was obtained from the patient following explanation of the procedure, risks, benefits and alternatives. The patient understands, agrees and consents for the procedure. All questions were addressed. A time out was performed prior to the initiation of the procedure. Maximal barrier sterile technique utilized including caps, mask, sterile gowns, sterile gloves, large sterile drape, hand hygiene, and Betadine prep. Under sterile conditions  and local anesthesia, ultrasound micropuncture access performed of the right common femoral artery. Images obtained for documentation of the patent right common femoral artery. Five French sheath inserted over a Bentson guidewire. Initially, a C2 catheter was utilized to select the left renal artery. Initial left renal angiogram performed. Left renal artery origin is widely patent. Minor nonocclusive atherosclerotic change of the main renal artery. Peripheral branches are patent. Previous coil embolization noted within the mid to upper pole region. Initial angiogram demonstrates trace abnormal vascularity along the left kidney lateral cortical margin suspicious for an area of bleeding versus tumor vascularity. Catheter exchangeD for a chung 2.5 catheter. This was reformed in the thoracic aorta and retracted to select the left renal artery. Additional left renal angiograms performed in oblique projections. Again, from the mid pole laterally there is abnormal vascularity outside of the renal parenchymal phase concerning for an area of arterial bleeding when correlated with CTA. Through the chung 2.5 catheter, the Renegade STC catheter was advanced over a fathom 14 guidewire initially into an upper pole segmental branch. Angiogram within this branch demonstrates normal parenchymal staining without tumor vascularity. Microcatheter retracted and utilized to select an additional segmental branch to the upper pole. Angiogram within this second branch demonstrates normal renal vascularity as well. Microcatheter retracted and advanced over a GT Glidewire into mid pole segmental branches. Additional angiogram demonstrates normal renal vascularity. Faint tumor vascularity still visualized laterally. Over the fathom 14 guidewire, eventually the microcatheter was advanced into an interpolar branch close to the existing microcoils. Angiogram within this segmental branch demonstrates enlarged tortuous tumor vascularity with  contrast extravasation into the surrounding retroperitoneal space. This correlates with the CTA. From this location, embolization was performed. Embolization: Through the Renegade STC catheter, 3 cc of 300-500 micron embospheres were slowly instilled under intermittent fluoroscopy watching for reflux. Once reflux was visualized, a single 3mm x 4 cm Azur CX detachable microcoil was successfully deployed within the interpolar abnormal segmental branch. After approximately 5 minutes, repeat angiogram performed through the microcatheter within the branch. Following embolization this branch is occluded and the abnormal vascularity to the angiomyolipoma is no longer visualized. Microcatheter removed. Final renal angiogram performed through the 5 French Chung catheter. This confirms preservation of the main renal artery supply to the left kidney. No additional abnormal vascularity visualized. Injection of the right common femoral artery sheath confirms adequate access for closure device. Of note, there is beaded appearance of the right external iliac artery compatible with nonocclusive fibromuscular dysplasia. Right common femoral, proximal profunda femoral, proximal superficial femoral arteries are all patent. Successful  deployment of the CELT device for hemostasis. 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 Electronically Signed   By: Judie Petit.  Shick M.D.   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 Remote embolization 2018. EXAM: ULTRASOUND GUIDANCE FOR VASCULAR ACCESS LEFT RENAL ANGIOGRAM PERIPHERAL MICRO CATHETERIZATIONS AND ANGIOGRAMS OF 3 ADDITIONAL SEGMENTAL BRANCHES TO THE LEFT KIDNEY UPPER POLE  PERIPHERAL EMBOLIZATION OF AN ABNORMAL LEFT INTERPOLE SEGMENTAL BRANCH SUPPLYING THE RENAL ANGIOMYOLIPOMA MEDICATIONS: 1% LIDOCAINE LOCAL. ANESTHESIA/SEDATION: Moderate (conscious) sedation was employed during this procedure. A total of Versed 2.0 mg and Fentanyl 100 mcg was administered intravenously by the radiology nurse. Total intra-service moderate Sedation Time: 56 minutes. The patient's level of consciousness and vital signs were monitored continuously by radiology nursing throughout the procedure under my direct supervision. CONTRAST:  76 cc omni 300 FLUOROSCOPY: Radiation Exposure Index (as provided by the fluoroscopic device): 1,414 MGy Kerma COMPLICATIONS: None immediate. PROCEDURE: Informed consent was obtained from the patient following explanation of the procedure, risks, benefits and alternatives. The patient understands, agrees and consents for the procedure. All questions were addressed. A time out was performed prior to the initiation of the procedure. Maximal barrier sterile technique utilized including caps, mask, sterile gowns, sterile gloves, large sterile drape, hand hygiene, and Betadine prep. Under sterile conditions and local anesthesia, ultrasound micropuncture access performed of the right common femoral artery. Images obtained for documentation of the patent right common femoral artery. Five French sheath inserted over a Bentson guidewire. Initially, a C2 catheter was utilized to select the left renal artery. Initial left renal angiogram performed. Left renal artery origin is widely patent. Minor nonocclusive atherosclerotic change of the main renal artery. Peripheral branches are patent. Previous coil embolization noted within the mid to upper pole region. Initial angiogram demonstrates trace abnormal vascularity along the left kidney lateral cortical margin suspicious for an area of bleeding versus tumor vascularity. Catheter exchangeD for a chung 2.5 catheter. This was reformed in the  thoracic aorta and retracted to select the left renal artery. Additional left renal angiograms performed in oblique projections. Again, from the mid pole laterally there is abnormal vascularity outside of the renal parenchymal phase concerning for an area of arterial bleeding when correlated with CTA. Through the chung 2.5 catheter, the Renegade STC catheter was advanced over a fathom 14 guidewire initially into an upper pole segmental branch. Angiogram within this branch demonstrates normal parenchymal staining without tumor vascularity. Microcatheter retracted and utilized to select an additional segmental branch to the upper pole. Angiogram within this second branch demonstrates normal renal vascularity as well. Microcatheter retracted and advanced over a GT Glidewire into mid pole segmental branches. Additional angiogram demonstrates normal renal vascularity. Faint tumor vascularity still visualized laterally. Over the fathom 14 guidewire, eventually the microcatheter was advanced into an interpolar branch close to the existing microcoils. Angiogram within this segmental branch demonstrates enlarged tortuous tumor vascularity with contrast extravasation into the surrounding retroperitoneal space. This correlates with the CTA. From this location, embolization was performed. Embolization: Through the Renegade STC catheter, 3 cc of 300-500 micron embospheres were slowly instilled under intermittent fluoroscopy watching for reflux. Once reflux was visualized, a single 3mm x 4 cm Azur CX detachable microcoil was successfully deployed within the interpolar abnormal segmental branch. After approximately  5 minutes, repeat angiogram performed through the microcatheter within the branch. Following embolization this branch is occluded and the abnormal vascularity to the angiomyolipoma is no longer visualized. Microcatheter removed. Final renal angiogram performed through the 5 French Chung catheter. This confirms  preservation of the main renal artery supply to the left kidney. No additional abnormal vascularity visualized. Injection of the right common femoral artery sheath confirms adequate access for closure device. Of note, there is beaded appearance of the right external iliac artery compatible with nonocclusive fibromuscular dysplasia. Right common femoral, proximal profunda femoral, proximal superficial femoral arteries are all patent. Successful deployment of the CELT device for hemostasis. 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 Electronically Signed   By: Judie Petit.  Shick M.D.   On: 03/11/2023 08:19   CT ABDOMEN PELVIS W CONTRAST  Result Date: 03/10/2023 CLINICAL DATA:  Acute generalized abdominal pain. EXAM: CT ABDOMEN AND PELVIS WITH CONTRAST TECHNIQUE: Multidetector CT imaging of the abdomen and pelvis was performed using the standard protocol following bolus administration of intravenous contrast. RADIATION DOSE REDUCTION: This exam was performed according to the departmental dose-optimization program which includes automated exposure control, adjustment of the mA and/or kV according to patient size and/or use of iterative reconstruction technique. CONTRAST:  OMNIPAQUE IOHEXOL 300 MG/ML  SOLN COMPARISON:  September 15, 2022. FINDINGS: Lower chest: No acute abnormality. Hepatobiliary: No cholelithiasis or biliary dilatation is noted. Multiple hepatic cysts are again noted. Pancreas: Unremarkable. No pancreatic ductal dilatation or surrounding inflammatory changes. Spleen: Normal in size without focal abnormality. Adrenals/Urinary Tract: Adrenal glands are unremarkable. Small nonobstructive right renal calculus is noted. No hydronephrosis. Urinary bladder is unremarkable. The patient is status post embolization of left  renal angiomyolipoma according to prior exam. However, there is now interval development of large subcapsular hematoma and associated left perirenal hematoma superior to the left kidney measuring 7.3 x 5.3 cm. There does appear to be active extravasation of contrast suggesting active hemorrhage. Stomach/Bowel: Stomach is within normal limits. Appendix appears normal. No evidence of bowel wall thickening, distention, or inflammatory changes. Sigmoid diverticulosis without inflammation. Vascular/Lymphatic: Aortic atherosclerosis. No enlarged abdominal or pelvic lymph nodes. Reproductive: Uterus is unremarkable. Stable right adnexal mass is noted with calcifications, most consistent with fibroid as noted on prior studies. Other: No ascites or hernia is noted. Musculoskeletal: Old T12, L2 and L4 fractures are noted. No acute osseous abnormality is noted. IMPRESSION: Patient is reportedly status post embolization of left renal angiomyolipoma according to prior exam. There is now interval development of large left renal subcapsular hematoma and associated left pararenal hematoma superior to the left kidney, active extravasation of contrast suggesting active hemorrhage. Critical Value/emergent results were called by telephone at the time of interpretation on 03/10/2023 at 4:08 pm to provider Dr. Charm Barges, who verbally acknowledged these results. Sigmoid diverticulosis without inflammation. Aortic Atherosclerosis (ICD10-I70.0). Electronically Signed   By: Lupita Raider M.D.   On: 03/10/2023 16:10    Microbiology: Results for orders placed or performed during the hospital encounter of 03/11/16  Fungus Culture With Stain (Not @ [facility])     Status: None   Collection Time: 03/11/16 12:07 PM   Specimen: Soft Tissue, Other  Result Value Ref Range Status   Fungus Stain Final report  Final   Fungus (Mycology) Culture Final report  Final    Comment: (NOTE) Performed At: [facility]  [place] [address] [place], [place]  [id number] Mila Homer MD NG:[id number]    Fungal Source WOUND  Final    Comment: RIGHT FINGER   Aerobic/Anaerobic Culture (surgical/deep wound)     Status: None   Collection Time: 03/11/16 12:07 PM   Specimen: Soft Tissue, Other  Result Value Ref Range Status   Specimen Description WOUND RIGHT FINGER  Final   Special Requests RIGHT RING FINGER SYNVOIUM  Final   Gram Stain   Final    FEW WBC PRESENT, PREDOMINANTLY MONONUCLEAR RARE GRAM NEGATIVE RODS    Culture No growth aerobically or anaerobically.  Final   Report Status 03/16/2016 FINAL  Final  Acid Fast Smear (AFB)     Status: None   Collection Time: 03/11/16 12:07 PM   Specimen: Soft Tissue, Other  Result Value Ref Range Status   AFB Specimen Processing Concentration  Final   Acid Fast Smear Negative  Final    Comment: (NOTE) Performed At: [facility] [address] [place], [place] [id number] Mila Homer MD [id number]    Source (AFB) WOUND  Final    Comment: RIGHT FINGER   Acid Fast Culture with reflexed sensitivities     Status: None   Collection Time: 03/11/16 12:07 PM  Result Value Ref Range Status   Acid Fast Culture Negative  Final    Comment: (NOTE) No acid fast bacilli isolated after 6 weeks. Performed At: [facility] [address] [place], [place] [id number] Mila Homer MD PI:[id number]    Source of Sample WOUND  Final    Comment: RIGHT FINGER   Fungus Culture Result     Status: None   Collection Time: 03/11/16 12:07 PM  Result Value Ref Range Status   Result 1 Comment  Final    Comment: (NOTE) KOH/Calcofluor preparation:  no fungus observed. Performed At: [facility] [address] [place], [place] [id number] Mila Homer MD NA:[id number]   Fungal organism reflex     Status: None   Collection Time: 03/11/16 12:07 PM  Result Value Ref Range Status   Fungal result 1 Comment  Final    Comment: (NOTE) No yeast or mold isolated after 4  weeks. Performed At: [facility] [address] [place], [place] [id number] Mila Homer MD EG:[id number]    Labs: CBC: Recent Labs  Lab 03/11/23 1619 03/11/23 1752 03/11/23 2159 03/12/23 0418 03/12/23 0726 03/12/23 1444 03/12/23 1445 03/12/23 1901 03/12/23 2309 03/13/23 0529 03/13/23 0742 03/13/23 1230  WBC 13.9* 13.2*  --  12.1*  --  10.9*  --   --   --  8.1  --   --   NEUTROABS 11.2* 10.7*  --  9.5*  --  8.5*  --   --   --  6.5  --   --   HGB 10.3* 9.6*   < > 8.2*   < > 7.8*   < > 7.4* 7.2* 7.5* 7.9* 7.6*  HCT 29.8* 28.3*   < > 23.7*   < > 23.8*   < > 22.1* 21.1* 21.8* 23.4* 22.7*  MCV 96.8 96.3  --  92.9  --  95.2  --   --   --  96.9  --   --   PLT 154 142*  --  116*  --  124*  --   --   --  111*  --   --    < > = values in this interval not displayed.   Basic  Metabolic Panel: Recent Labs  Lab 03/11/23 0400 03/11/23 1752 03/12/23 0418 03/12/23 1444 03/13/23 0529  NA 135 135 134* 134* 137  K 5.3* 3.9 3.6 3.4* 3.4*  CL 106 103 101 101 104  CO2 16* 22 24 26 25   GLUCOSE 158* 157* 114* 122* 106*  BUN 34* 34* 26* 23 14  CREATININE 0.97 1.28* 0.79 0.98 0.68  CALCIUM 8.6* 8.4* 8.0* 8.4* 8.3*  MG  --  2.1 2.1 2.2 1.9  PHOS  --  3.9 3.2 2.2* 2.8   Liver Function Tests: Recent Labs  Lab 03/11/23 0400 03/11/23 1752 03/12/23 0418 03/12/23 1444 03/13/23 0529  AST 22 82* 73* 55* 40  ALT 14 60* 61* 55* 46*  ALKPHOS 45 41 36* 37* 39  BILITOT 0.9 0.5 1.0 0.8 1.1  PROT 6.0* 5.9* 5.3* 5.3* 5.3*  ALBUMIN 3.6 3.5 3.1* 3.0* 2.9*   CBG: No results for input(s): "GLUCAP" in the last 168 hours.  Discharge time spent: greater than 30 minutes.  Signed: Marguerita Merles, DO [facility] 03/15/2023

## 2023-03-16 ENCOUNTER — Telehealth: Payer: Self-pay | Admitting: Interventional Cardiology

## 2023-03-16 ENCOUNTER — Encounter: Payer: Self-pay | Admitting: Physician Assistant

## 2023-03-16 ENCOUNTER — Ambulatory Visit: Payer: Medicare Other | Attending: Physician Assistant | Admitting: Physician Assistant

## 2023-03-16 VITALS — BP 158/80 | HR 82 | Ht 67.0 in | Wt 164.0 lb

## 2023-03-16 DIAGNOSIS — Z7901 Long term (current) use of anticoagulants: Secondary | ICD-10-CM

## 2023-03-16 DIAGNOSIS — I4891 Unspecified atrial fibrillation: Secondary | ICD-10-CM

## 2023-03-16 DIAGNOSIS — Z79899 Other long term (current) drug therapy: Secondary | ICD-10-CM

## 2023-03-16 DIAGNOSIS — I10 Essential (primary) hypertension: Secondary | ICD-10-CM

## 2023-03-16 DIAGNOSIS — R0789 Other chest pain: Secondary | ICD-10-CM | POA: Diagnosis not present

## 2023-03-16 NOTE — Patient Instructions (Signed)
Medication Instructions:  Your physician recommends that you continue on your current medications as directed. Please refer to the Current Medication list given to you today. PLEASE CONTINUE ELIQUIS.  *If you need a refill on your cardiac medications before your next appointment, please call your pharmacy*   Lab Work: None  If you have labs (blood work) drawn today and your tests are completely normal, you will receive your results only by: MyChart Message (if you have MyChart) OR A paper copy in the mail If you have any lab test that is abnormal or we need to change your treatment, we will call you to review the results.   Testing/Procedures: None   Follow-Up: At [facility], you and your health needs are our priority.  As part of our continuing mission to provide you with exceptional heart care, we have created designated Provider Care Teams.  These Care Teams include your primary Cardiologist (physician) and Advanced Practice Providers (APPs -  Physician Assistants and Nurse Practitioners) who all work together to provide you with the care you need, when you need it.  We recommend signing up for the patient portal called "MyChart".  Sign up information is provided on this After Visit Summary.  MyChart is used to connect with patients for Virtual Visits (Telemedicine).  Patients are able to view lab/test results, encounter notes, upcoming appointments, etc.  Non-urgent messages can be sent to your provider as well.   To learn more about what you can do with MyChart, go to [email or web address].    Your next appointment:   3 week(s)  Provider:   Lewayne Bunting, MD    Other Instructions Your physician has requested that you regularly monitor and record your blood pressure readings at home. Please use the same machine at the same time of day to check your readings and record them to bring to your follow-up visit.   Please monitor blood pressures and keep a log of your  readings.    Make sure to check 2 hours after your medications.    AVOID these things for 30 minutes before checking your blood pressure: No Drinking caffeine. No Drinking alcohol. No Eating. No Smoking. No Exercising.   Five minutes before checking your blood pressure: Pee. Sit in a dining chair. Avoid sitting in a soft couch or armchair. Be quiet. Do not talk

## 2023-03-16 NOTE — Progress Notes (Signed)
Cardiology Office Note:  .   Date:  03/16/2023  ID:  Sheilah Mins, DOB [birth year], MRN [id number] PCP: Alysia Penna, MD  [facility] Providers Cardiologist:  Lance Muss, MD {   History of Present Illness: .   Ann Vang is a 77 y.o. female with a past medical history of palpitations since May 2021 who presents for follow-up.  In early 2022, she needed surgery (D&C at her GYN office) but was found to be in atrial fibrillation when she showed up for the procedure.  Ultimately, it was canceled.  Started on amiodarone February 2022 to help her maintain normal sinus rhythm.  She was able to have her D&C procedure done.  Due to some bleeding, she held Xarelto for an extra couple of days.  Initially considered using amiodarone just around the time of surgery.  Since her symptoms are better controlled.  Medication was continued.  Had been doing well on Xarelto but started bleeding April 2022 and Xarelto was switched to Eliquis.  In April 2022 decrease amiodarone to 200 mg daily and ultimately now is taking 100 mg daily.  Stressed the importance of routine monitoring of blood work as well as chest x-ray.  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  However, she was having some bleeding in her urine and went to see Dr. Cardell Peach her urologist.  Kidney tumor was about the same size but nobody could tell her why she was bleeding.  She went to a urologist in [facility] Dr. Earlene Plater.  Ultimately, was referred to an oncologist to help treat the tumor.  Tuesday she was working out in the morning and experienced some left-sided pain in her stomach.  Nausea and extreme pain so she called her husband and landed at the ER.  CT scan confirmed that the tumor had ruptured.  She had another embolization and Eliquis was held.  She is coming in today to see if she can hold her Eliquis indefinitely.  We discussed a 3.2% risk of stroke.  She states that she has not had any further  A-fib since 2022 (she wears an Apple Watch that has not given her any notifications).  We discussed possible Watchman procedure and have referred her to EP for further discussion.  Reports no shortness of breath nor dyspnea on exertion. Reports no chest pain, pressure, or tightness. No edema, orthopnea, PND. Reports no palpitations.   ROS: Pertinent ROS in HPI  Studies Reviewed: .       Echocardiogram 06/25/2020 IMPRESSIONS     1. Left ventricular ejection fraction, by estimation, is 65 to 70%. The  left ventricle has normal function. The left ventricle has no regional  wall motion abnormalities. There is mild asymmetric left ventricular  hypertrophy of the basal-septal segment.  Left ventricular diastolic parameters are indeterminate.   2. Right ventricular systolic function is normal. The right ventricular  size is normal. There is mildly elevated pulmonary artery systolic  pressure. The estimated right ventricular systolic pressure is 41.2 mmHg.   3. The mitral valve is normal in structure. Trivial mitral valve  regurgitation.   4. The aortic valve is tricuspid. Aortic valve regurgitation is not  visualized. No aortic stenosis is present.   5. There is mild dilatation of the ascending aorta, measuring 38 mm.   6. The inferior vena cava is normal in size with greater than 50%  respiratory variability, suggesting right atrial pressure of 3 mmHg.   FINDINGS  Left Ventricle: Left ventricular ejection fraction, by estimation, is 65  to 70%. The left ventricle has normal function. The left ventricle has no  regional wall motion abnormalities. The left ventricular internal cavity  size was normal in size. There is   mild asymmetric left ventricular hypertrophy of the basal-septal segment.  Left ventricular diastolic parameters are indeterminate.   Right Ventricle: The right ventricular size is normal. Right vetricular  wall thickness was not assessed. Right ventricular systolic  function is  normal. There is mildly elevated pulmonary artery systolic pressure. The  tricuspid regurgitant velocity is  3.09 m/s, and with an assumed right atrial pressure of 3 mmHg, the  estimated right ventricular systolic pressure is 41.2 mmHg.   Left Atrium: Left atrial size was normal in size.   Right Atrium: Right atrial size was normal in size. Prominent Eustachian  valve.   Pericardium: There is no evidence of pericardial effusion.   Mitral Valve: The mitral valve is normal in structure. Trivial mitral  valve regurgitation.   Tricuspid Valve: The tricuspid valve is normal in structure. Tricuspid  valve regurgitation is trivial.   Aortic Valve: The aortic valve is tricuspid. Aortic valve regurgitation is  not visualized. No aortic stenosis is present.   Pulmonic Valve: The pulmonic valve was not well visualized. Pulmonic valve  regurgitation is trivial.   Aorta: The aortic root is normal in size and structure. There is mild  dilatation of the ascending aorta, measuring 38 mm.   Venous: The inferior vena cava is normal in size with greater than 50%  respiratory variability, suggesting right atrial pressure of 3 mmHg.   IAS/Shunts: No atrial level shunt detected by color flow Doppler.   Risk Assessment/Calculations:    CHA2DS2-VASc Score = 3   This indicates a 3.2% annual risk of stroke. The patient's score is based upon: CHF History: 0 HTN History: 0 Diabetes History: 0 Stroke History: 0 Vascular Disease History: 0 Age Score: 2 Gender Score: 1      Physical Exam:   VS:  BP (!) 158/80   Pulse 82   Ht 5\' 7"  (1.702 m)   Wt 164 lb (74.4 kg)   LMP 08/18/1994 (Approximate)   SpO2 99%   BMI 25.69 kg/m    Wt Readings from Last 3 Encounters:  03/16/23 164 lb (74.4 kg)  03/13/23 164 lb 14.5 oz (74.8 kg)  03/27/22 164 lb (74.4 kg)    GEN: Well nourished, well developed in no acute distress NECK: No JVD; No carotid bruits CARDIAC: RRR, no murmurs, rubs,  gallops RESPIRATORY:  Clear to auscultation without rales, wheezing or rhonchi  ABDOMEN: Soft, non-tender, non-distended EXTREMITIES:  No edema; No deformity   ASSESSMENT AND PLAN: .   1.  PAF -No A-fib on her Apple Watch.  Continue 100 mg of amiodarone.  Labs and CXR followed every 6 months -Currently, Eliquis is on hold with plans to restart Wednesday -She inquired about watchman and we provided her some literature today, EP follow-up was arranged  2.  Anticoagulated -Discussed a 3.2% risk of stroke although, had recent bleeding.  Risk versus reward discussion.  The hope is to get her in with EP for watchman discussion and implantation so she can further reduce her risk of bleeding by discontinuing her Eliquis after placement.  3.  Prior chest pain   -Luckily, no recent chest pain -Continue amiodarone 100 mg daily, Eliquis 5 mg twice a day   4.  Hypertension -Whitecoat hypertension, normal on repeat -  Continue current medication regimen   Dispo: Follow-up with EP ASAP  Signed, Sharlene Dory, PA-C

## 2023-03-16 NOTE — Telephone Encounter (Signed)
  Per MyChart Scheduling message:  I almost bleed to death being on Eliquis last week. What can we do? I could have this again and. Any not be as lucky.

## 2023-03-16 NOTE — Telephone Encounter (Signed)
Per recent hospital note  Interventional radiology evaluated as well as urology and they cleared the patient for discharge. Her hemoglobin stabilized and she is medically stable for discharge at this time will need to follow-up with PCP, cardiology, interventional radiology and urology in outpatient setting. She was told to avoid her anticoagulation till next Wednesday but she will need to discuss with cardiology about resumption   I spoke with patient. She is to hold Eliquis until 7/31 but needs to discuss with cardiology prior to resuming. Patient does not feel comfortable resuming current dose of Eliquis.  Asking about possibly resuming at lower dose Appointment made for patient to see Jari Favre, PA today at 10:55

## 2023-03-16 NOTE — Telephone Encounter (Signed)
  Per MyChart scheduling message:  I almost bleed to death being on Eliquis last week. What can we do? I could have this again and. Any not be as lucky.

## 2023-03-23 DIAGNOSIS — E872 Acidosis, unspecified: Secondary | ICD-10-CM | POA: Diagnosis not present

## 2023-03-23 DIAGNOSIS — S37012A Minor contusion of left kidney, initial encounter: Secondary | ICD-10-CM | POA: Diagnosis not present

## 2023-03-23 DIAGNOSIS — I4891 Unspecified atrial fibrillation: Secondary | ICD-10-CM | POA: Diagnosis not present

## 2023-03-26 ENCOUNTER — Ambulatory Visit: Payer: Medicare Other | Admitting: Internal Medicine

## 2023-03-26 DIAGNOSIS — G43019 Migraine without aura, intractable, without status migrainosus: Secondary | ICD-10-CM | POA: Diagnosis not present

## 2023-03-31 ENCOUNTER — Ambulatory Visit: Admission: RE | Admit: 2023-03-31 | Payer: Medicare Other | Source: Ambulatory Visit

## 2023-03-31 DIAGNOSIS — D1771 Benign lipomatous neoplasm of kidney: Secondary | ICD-10-CM | POA: Diagnosis not present

## 2023-03-31 HISTORY — PX: IR RADIOLOGIST EVAL & MGMT: IMG5224

## 2023-03-31 NOTE — Progress Notes (Signed)
Chief Complaint: Patient was consulted remotely today (TeleHealth) for left renal AML at the request of Covington,Jamie R.    Referring Physician(s): Dr. Charlsie Merles   History of Present Illness: Ann Vang is a 77 y.o. 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  She underwent transarterial embolization via a left radial approach on 05/13/2017 and presents today for scheduled follow-up evaluation.   CT imaging dated 09/22/2019 demonstrates continued slight interval involution of the treated renal angiomyolipoma which now measures 4.9 x 3.2 x 5.2 cm.   CT imaging 10/01/21 - Post embolization changes with similar size and appearance of exophytic LEFT renal fat-containing mass, consistent with known AML.   Unfortunately, last month she developed some episodes of hematuria followed by acute onset of severe left flank pain requiring a visit to the emergency room.  Workup demonstrated a new acute bleed emanating from the left renal angiomyolipoma.  She required multiple units of transfusion and my partner, Dr. Denny Levy, performed a renal angiogram which demonstrated recannulization of the previously placed coil pack with active extravasation of multiple small vessels.  He added some additional coils and was able to achieve hemostasis.  Ann Vang notes that she had been experiencing intermittent hematuria for several months prior to this event and had seen both her primary care physician and her urologist.  She had a CT scan performed in January of this year that demonstrated no further changes of her AML and additional workup was negative for a source of hematuria.  Clearly, these were warning signs of revascularization of the AML.  Upon reviewing her CT imaging, it is also clear that the AML comes in close contact and abuts the inferior surface of the spleen.  I wonder if there is some parasitized flow from intraparenchymal splenic arteries providing additional  arterial supply to the AML which allowed it to survive our initial treatment.  Currently, she has recovered and doing well.  She has no issues with hematuria or flank pain at this time.  She is interested in pursuing placement of a Watchman device to allow her to get off of Eliquis which she is currently for atrial fibrillation.  Past Medical History:  Diagnosis Date   Abnormal breast finding 12/1998   left breast abnl   Arthritis    --basal joint--right hand   Carpal tunnel syndrome, right    Compression fracture of T2 vertebra (HCC) 05/10/2019   Diverticulosis 2003   mild   Lumbar compression fracture (HCC) 07/13/2012   Lumbar disc disorder 2000   bulging   Melena    Menopause    Osteoporosis    Fx L4 age 45 water skiing accident   Palpitations    Thyroid disease 10/2014   thyroid nodules--bx'd large nodule and was benign--sees Gen. Careers adviser with [facility]    Past Surgical History:  Procedure Laterality Date   AUGMENTATION MAMMAPLASTY  1983   Implants have been removed   BREAST SURGERY  2010   implants removed ruptured   CERVICAL BIOPSY  W/ LOOP ELECTRODE EXCISION  1996   CIN   IR ANGIOGRAM SELECTIVE EACH ADDITIONAL VESSEL  03/10/2023   IR ANGIOGRAM SELECTIVE EACH ADDITIONAL VESSEL  03/10/2023   IR ANGIOGRAM SELECTIVE EACH ADDITIONAL VESSEL  03/10/2023   IR EMBO ART  VEN HEMORR LYMPH EXTRAV  INC GUIDE ROADMAPPING  03/10/2023   IR EMBO TUMOR ORGAN ISCHEMIA INFARCT INC GUIDE ROADMAPPING  05/13/2017   IR RADIOLOGIST EVAL & MGMT  04/15/2017  IR RADIOLOGIST EVAL & MGMT  05/27/2017   IR RADIOLOGIST EVAL & MGMT  01/21/2018   IR RADIOLOGIST EVAL & MGMT  09/28/2019   IR RADIOLOGIST EVAL & MGMT  10/07/2021   IR RADIOLOGIST EVAL & MGMT  03/31/2023   IR RENAL SELECTIVE  UNI INC S&I MOD SED  03/10/2023   IR RENAL SUPRASEL UNI S&I MOD SED  05/13/2017   IR US GUIDE VASC ACCESS LEFT  05/13/2017   IR US GUIDE VASC ACCESS RIGHT  03/10/2023   KNEE SURGERY  2012   meniscus rt knee (both)   L-2  fracture  2009   fall in aerobics   L-4 fracture   water skiing     TENOSYNOVECTOMY Right 03/11/2016   Procedure: TENOSYNOVECTOMY FLEXOR RIGHT RING FINGER;  Surgeon: Cindee Salt, MD;  Location: [facility];  Service: Orthopedics;  Laterality: Right;   thyroid nodule biopsy  10/2014   --[facility], [place]--   TONSILLECTOMY AND ADENOIDECTOMY  1964    Allergies: Patient has no known allergies.  Medications: Prior to Admission medications   Medication Sig Start Date End Date Taking? Authorizing Provider  amiodarone (PACERONE) 200 MG tablet Take 0.5 tablets (100 mg total) by mouth daily. 10/10/22   Corky Crafts, MD  apixaban (ELIQUIS) 5 MG TABS tablet Take 1 tablet (5 mg total) by mouth 2 (two) times daily. Speak With Cardiology Dr. Eldridge Dace about resuming Patient not taking: Reported on 03/16/2023 03/18/23   Marguerita Merles Latif, DO  Bacillus Coagulans-Inulin (PROBIOTIC) 1-250 BILLION-MG CAPS Take 1 capsule by mouth daily.    [provider]  Cholecalciferol (VITAMIN D) 2000 UNITS tablet Take 2,000 Units by mouth daily.    [provider]  estradiol (VIVELLE-DOT) 0.0375 MG/24HR Place 1 patch onto the skin 2 (two) times a week. 04/16/21   [provider]  fluticasone (FLONASE) 50 MCG/ACT nasal spray Place 2 sprays into the nose daily. Patient taking differently: Place 2 sprays into the nose daily as needed for allergies. 07/13/12   Edwyna Perfect, MD  magnesium gluconate (MAGONATE) 500 MG tablet Take 500 mg by mouth 2 (two) times daily.    [provider]  milk thistle 175 MG tablet Take 175 mg by mouth daily.    [provider]  OVER THE COUNTER MEDICATION Take 1 tablet by mouth daily at 6 (six) AM. Medication: Vitamin K7    [provider]  progesterone (PROMETRIUM) 100 MG capsule TAKE ONE CAPSULE BY MOUTH DAILY Patient taking differently: Take 100 mg by mouth daily. 02/23/19   Jerene Bears, MD     Family History   Problem Relation Age of Onset   Osteoporosis Mother    Alzheimer's disease Mother    Hypertension Father    Heart attack Father    Osteoporosis Maternal Grandmother    Colon cancer Maternal Grandmother    Colon cancer Other     Social History   Socioeconomic History   Marital status: Married    Spouse name: Not on file   Number of children: Not on file   Years of education: Not on file   Highest education level: Not on file  Occupational History   Not on file  Tobacco Use   Smoking status: Former    Current packs/day: 0.00    Types: Cigarettes    Quit date: 12/21/1972    Years since quitting: 50.3   Smokeless tobacco: Never  Vaping Use   Vaping status: Never Used  Substance  and Sexual Activity   Alcohol use: Yes    Alcohol/week: 14.0 standard drinks of alcohol    Types: 14 Glasses of wine per week   Drug use: No   Sexual activity: Yes    Partners: Male    Birth control/protection: Post-menopausal  Other Topics Concern   Not on file  Social History Narrative   Not on file   Social Determinants of Health   Financial Resource Strain: Not on file  Food Insecurity: No Food Insecurity (03/11/2023)   Hunger Vital Sign    Worried About Running Out of Food in the Last Year: Never true    Ran Out of Food in the Last Year: Never true  Transportation Needs: No Transportation Needs (03/11/2023)   PRAPARE - Administrator, Civil Service (Medical): No    Lack of Transportation (Non-Medical): No  Physical Activity: Not on file  Stress: Not on file  Social Connections: Not on file    Review of Systems  Review of Systems: A 12 point ROS discussed and pertinent positives are indicated in the HPI above.  All other systems are negative.  Advance Care Plan: The advanced care plan/surrogate decision maker was discussed at the time of visit and the patient did not wish to discuss or was not able to name a surrogate decision maker or provide an advance care plan.     Physical Exam No direct physical exam was performed (except for noted visual exam findings with Video Visits).    Vital Signs: LMP 08/18/1994 (Approximate)   Imaging: IR Radiologist Eval & Mgmt  Result Date: 03/31/2023 EXAM: ESTABLISHED PATIENT OFFICE VISIT CHIEF COMPLAINT: SEE EPIC NOTE HISTORY OF PRESENT ILLNESS: SEE EPIC NOTE REVIEW OF SYSTEMS: SEE EPIC NOTE PHYSICAL EXAMINATION: SEE EPIC NOTE ASSESSMENT AND PLAN: SEE EPIC NOTE Electronically Signed   By: Malachy Moan M.D.   On: 03/31/2023 09:06   IR US Guide Vasc Access Right  Result Date: 03/11/2023 INDICATION: Acute arterial bleeding from a known large left renal angiomyolipoma. Remote embolization 2018. EXAM: ULTRASOUND GUIDANCE FOR VASCULAR ACCESS LEFT RENAL ANGIOGRAM PERIPHERAL MICRO CATHETERIZATIONS AND ANGIOGRAMS OF 3 ADDITIONAL SEGMENTAL BRANCHES TO THE LEFT KIDNEY UPPER POLE PERIPHERAL EMBOLIZATION OF AN ABNORMAL LEFT INTERPOLE SEGMENTAL BRANCH SUPPLYING THE RENAL ANGIOMYOLIPOMA MEDICATIONS: 1% LIDOCAINE LOCAL. ANESTHESIA/SEDATION: Moderate (conscious) sedation was employed during this procedure. A total of Versed 2.0 mg and Fentanyl 100 mcg was administered intravenously by the radiology nurse. Total intra-service moderate Sedation Time: 56 minutes. The patient's level of consciousness and vital signs were monitored continuously by radiology nursing throughout the procedure under my direct supervision. CONTRAST:  76 cc omni 300 FLUOROSCOPY: Radiation Exposure Index (as provided by the fluoroscopic device): 1,414 MGy Kerma COMPLICATIONS: None immediate. PROCEDURE: Informed consent was obtained from the patient following explanation of the procedure, risks, benefits and alternatives. The patient understands, agrees and consents for the procedure. All questions were addressed. A time out was performed prior to the initiation of the procedure. Maximal barrier sterile technique utilized including caps, mask, sterile gowns, sterile  gloves, large sterile drape, hand hygiene, and Betadine prep. Under sterile conditions and local anesthesia, ultrasound micropuncture access performed of the right common femoral artery. Images obtained for documentation of the patent right common femoral artery. Five French sheath inserted over a Bentson guidewire. Initially, a C2 catheter was utilized to select the left renal artery. Initial left renal angiogram performed. Left renal artery origin is widely patent. Minor nonocclusive atherosclerotic change of the main renal artery.  Peripheral branches are patent. Previous coil embolization noted within the mid to upper pole region. Initial angiogram demonstrates trace abnormal vascularity along the left kidney lateral cortical margin suspicious for an area of bleeding versus tumor vascularity. Catheter exchangeD for a chung 2.5 catheter. This was reformed in the thoracic aorta and retracted to select the left renal artery. Additional left renal angiograms performed in oblique projections. Again, from the mid pole laterally there is abnormal vascularity outside of the renal parenchymal phase concerning for an area of arterial bleeding when correlated with CTA. Through the chung 2.5 catheter, the Renegade STC catheter was advanced over a fathom 14 guidewire initially into an upper pole segmental branch. Angiogram within this branch demonstrates normal parenchymal staining without tumor vascularity. Microcatheter retracted and utilized to select an additional segmental branch to the upper pole. Angiogram within this second branch demonstrates normal renal vascularity as well. Microcatheter retracted and advanced over a GT Glidewire into mid pole segmental branches. Additional angiogram demonstrates normal renal vascularity. Faint tumor vascularity still visualized laterally. Over the fathom 14 guidewire, eventually the microcatheter was advanced into an interpolar branch close to the existing microcoils. Angiogram  within this segmental branch demonstrates enlarged tortuous tumor vascularity with contrast extravasation into the surrounding retroperitoneal space. This correlates with the CTA. From this location, embolization was performed. Embolization: Through the Renegade STC catheter, 3 cc of 300-500 micron embospheres were slowly instilled under intermittent fluoroscopy watching for reflux. Once reflux was visualized, a single 3mm x 4 cm Azur CX detachable microcoil was successfully deployed within the interpolar abnormal segmental branch. After approximately 5 minutes, repeat angiogram performed through the microcatheter within the branch. Following embolization this branch is occluded and the abnormal vascularity to the angiomyolipoma is no longer visualized. Microcatheter removed. Final renal angiogram performed through the 5 French Chung catheter. This confirms preservation of the main renal artery supply to the left kidney. No additional abnormal vascularity visualized. Injection of the right common femoral artery sheath confirms adequate access for closure device. Of note, there is beaded appearance of the right external iliac artery compatible with nonocclusive fibromuscular dysplasia. Right common femoral, proximal profunda femoral, proximal superficial femoral arteries are all patent. Successful deployment of the CELT device for hemostasis. 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 Electronically Signed   By: Judie Petit.  Shick M.D.   On: 03/11/2023 08:19   IR Angiogram Renal Left Selective  Result Date: 03/11/2023 INDICATION: Acute arterial bleeding from a known large left renal angiomyolipoma. Remote embolization 2018. EXAM: ULTRASOUND GUIDANCE FOR VASCULAR ACCESS LEFT RENAL ANGIOGRAM PERIPHERAL MICRO CATHETERIZATIONS AND  ANGIOGRAMS OF 3 ADDITIONAL SEGMENTAL BRANCHES TO THE LEFT KIDNEY UPPER POLE PERIPHERAL EMBOLIZATION OF AN ABNORMAL LEFT INTERPOLE SEGMENTAL BRANCH SUPPLYING THE RENAL ANGIOMYOLIPOMA MEDICATIONS: 1% LIDOCAINE LOCAL. ANESTHESIA/SEDATION: Moderate (conscious) sedation was employed during this procedure. A total of Versed 2.0 mg and Fentanyl 100 mcg was administered intravenously by the radiology nurse. Total intra-service moderate Sedation Time: 56 minutes. The patient's level of consciousness and vital signs were monitored continuously by radiology nursing throughout the procedure under my direct supervision. CONTRAST:  76 cc omni 300 FLUOROSCOPY: Radiation Exposure Index (as provided by the fluoroscopic device): 1,414 MGy Kerma COMPLICATIONS: None immediate. PROCEDURE: Informed consent was obtained from the patient following explanation of the procedure, risks, benefits and alternatives. The patient understands, agrees and consents for the procedure. All questions  were addressed. A time out was performed prior to the initiation of the procedure. Maximal barrier sterile technique utilized including caps, mask, sterile gowns, sterile gloves, large sterile drape, hand hygiene, and Betadine prep. Under sterile conditions and local anesthesia, ultrasound micropuncture access performed of the right common femoral artery. Images obtained for documentation of the patent right common femoral artery. Five French sheath inserted over a Bentson guidewire. Initially, a C2 catheter was utilized to select the left renal artery. Initial left renal angiogram performed. Left renal artery origin is widely patent. Minor nonocclusive atherosclerotic change of the main renal artery. Peripheral branches are patent. Previous coil embolization noted within the mid to upper pole region. Initial angiogram demonstrates trace abnormal vascularity along the left kidney lateral cortical margin suspicious for an area of bleeding versus tumor  vascularity. Catheter exchangeD for a chung 2.5 catheter. This was reformed in the thoracic aorta and retracted to select the left renal artery. Additional left renal angiograms performed in oblique projections. Again, from the mid pole laterally there is abnormal vascularity outside of the renal parenchymal phase concerning for an area of arterial bleeding when correlated with CTA. Through the chung 2.5 catheter, the Renegade STC catheter was advanced over a fathom 14 guidewire initially into an upper pole segmental branch. Angiogram within this branch demonstrates normal parenchymal staining without tumor vascularity. Microcatheter retracted and utilized to select an additional segmental branch to the upper pole. Angiogram within this second branch demonstrates normal renal vascularity as well. Microcatheter retracted and advanced over a GT Glidewire into mid pole segmental branches. Additional angiogram demonstrates normal renal vascularity. Faint tumor vascularity still visualized laterally. Over the fathom 14 guidewire, eventually the microcatheter was advanced into an interpolar branch close to the existing microcoils. Angiogram within this segmental branch demonstrates enlarged tortuous tumor vascularity with contrast extravasation into the surrounding retroperitoneal space. This correlates with the CTA. From this location, embolization was performed. Embolization: Through the Renegade STC catheter, 3 cc of 300-500 micron embospheres were slowly instilled under intermittent fluoroscopy watching for reflux. Once reflux was visualized, a single 3mm x 4 cm Azur CX detachable microcoil was successfully deployed within the interpolar abnormal segmental branch. After approximately 5 minutes, repeat angiogram performed through the microcatheter within the branch. Following embolization this branch is occluded and the abnormal vascularity to the angiomyolipoma is no longer visualized. Microcatheter removed. Final  renal angiogram performed through the 5 French Chung catheter. This confirms preservation of the main renal artery supply to the left kidney. No additional abnormal vascularity visualized. Injection of the right common femoral artery sheath confirms adequate access for closure device. Of note, there is beaded appearance of the right external iliac artery compatible with nonocclusive fibromuscular dysplasia. Right common femoral, proximal profunda femoral, proximal superficial femoral arteries are all patent. Successful deployment of the CELT device for hemostasis. 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 Electronically Signed   By: Judie Petit.  Shick M.D.   On: 03/11/2023 08:19   IR EMBO ART  VEN HEMORR LYMPH EXTRAV  INC GUIDE ROADMAPPING  Result Date: 03/11/2023 INDICATION: Acute arterial bleeding from a known large left renal angiomyolipoma. Remote embolization 2018. EXAM: ULTRASOUND GUIDANCE FOR VASCULAR ACCESS LEFT RENAL ANGIOGRAM PERIPHERAL MICRO CATHETERIZATIONS AND ANGIOGRAMS OF 3 ADDITIONAL SEGMENTAL BRANCHES TO THE LEFT KIDNEY UPPER POLE PERIPHERAL EMBOLIZATION OF AN ABNORMAL LEFT INTERPOLE SEGMENTAL BRANCH SUPPLYING THE RENAL  ANGIOMYOLIPOMA MEDICATIONS: 1% LIDOCAINE LOCAL. ANESTHESIA/SEDATION: Moderate (conscious) sedation was employed during this procedure. A total of Versed 2.0 mg and Fentanyl 100 mcg was administered intravenously by the radiology nurse. Total intra-service moderate Sedation Time: 56 minutes. The patient's level of consciousness and vital signs were monitored continuously by radiology nursing throughout the procedure under my direct supervision. CONTRAST:  76 cc omni 300 FLUOROSCOPY: Radiation Exposure Index (as provided by the fluoroscopic device): 1,414 MGy Kerma COMPLICATIONS: None immediate.  PROCEDURE: Informed consent was obtained from the patient following explanation of the procedure, risks, benefits and alternatives. The patient understands, agrees and consents for the procedure. All questions were addressed. A time out was performed prior to the initiation of the procedure. Maximal barrier sterile technique utilized including caps, mask, sterile gowns, sterile gloves, large sterile drape, hand hygiene, and Betadine prep. Under sterile conditions and local anesthesia, ultrasound micropuncture access performed of the right common femoral artery. Images obtained for documentation of the patent right common femoral artery. Five French sheath inserted over a Bentson guidewire. Initially, a C2 catheter was utilized to select the left renal artery. Initial left renal angiogram performed. Left renal artery origin is widely patent. Minor nonocclusive atherosclerotic change of the main renal artery. Peripheral branches are patent. Previous coil embolization noted within the mid to upper pole region. Initial angiogram demonstrates trace abnormal vascularity along the left kidney lateral cortical margin suspicious for an area of bleeding versus tumor vascularity. Catheter exchangeD for a chung 2.5 catheter. This was reformed in the thoracic aorta and retracted to select the left renal artery. Additional left renal angiograms performed in oblique projections. Again, from the mid pole laterally there is abnormal vascularity outside of the renal parenchymal phase concerning for an area of arterial bleeding when correlated with CTA. Through the chung 2.5 catheter, the Renegade STC catheter was advanced over a fathom 14 guidewire initially into an upper pole segmental branch. Angiogram within this branch demonstrates normal parenchymal staining without tumor vascularity. Microcatheter retracted and utilized to select an additional segmental branch to the upper pole. Angiogram within this second branch demonstrates  normal renal vascularity as well. Microcatheter retracted and advanced over a GT Glidewire into mid pole segmental branches. Additional angiogram demonstrates normal renal vascularity. Faint tumor vascularity still visualized laterally. Over the fathom 14 guidewire, eventually the microcatheter was advanced into an interpolar branch close to the existing microcoils. Angiogram within this segmental branch demonstrates enlarged tortuous tumor vascularity with contrast extravasation into the surrounding retroperitoneal space. This correlates with the CTA. From this location, embolization was performed. Embolization: Through the Renegade STC catheter, 3 cc of 300-500 micron embospheres were slowly instilled under intermittent fluoroscopy watching for reflux. Once reflux was visualized, a single 3mm x 4 cm Azur CX detachable microcoil was successfully deployed within the interpolar abnormal segmental branch. After approximately 5 minutes, repeat angiogram performed through the microcatheter within the branch. Following embolization this branch is occluded and the abnormal vascularity to the angiomyolipoma is no longer visualized. Microcatheter removed. Final renal angiogram performed through the 5 French Chung catheter. This confirms preservation of the main renal artery supply to the left kidney. No additional abnormal vascularity visualized. Injection of the right common femoral artery sheath confirms adequate access for closure device. Of note, there is beaded appearance of the right external iliac artery compatible with nonocclusive fibromuscular dysplasia. Right common femoral, proximal profunda femoral, proximal superficial femoral arteries are all patent. Successful deployment of the CELT device for hemostasis. IMPRESSION: Successful left renal angiography  with micro catheterization and coil embolization of a left renal interpolar segmental branch supplying tumor vascularity to the known renal angiomyolipoma with  evidence of active bleeding. Electronically Signed   By: Judie Petit.  Shick M.D.   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 Remote embolization 2018. EXAM: ULTRASOUND GUIDANCE FOR VASCULAR ACCESS LEFT RENAL ANGIOGRAM PERIPHERAL MICRO CATHETERIZATIONS AND ANGIOGRAMS OF 3 ADDITIONAL SEGMENTAL BRANCHES TO THE LEFT KIDNEY UPPER POLE PERIPHERAL EMBOLIZATION OF AN ABNORMAL LEFT INTERPOLE SEGMENTAL BRANCH SUPPLYING THE RENAL ANGIOMYOLIPOMA MEDICATIONS: 1% LIDOCAINE LOCAL. ANESTHESIA/SEDATION: Moderate (conscious) sedation was employed during this procedure. A total of Versed 2.0 mg and Fentanyl 100 mcg was administered intravenously by the radiology nurse. Total intra-service moderate Sedation Time: 56 minutes. The patient's level of consciousness and vital signs were monitored continuously by radiology nursing throughout the procedure under my direct supervision. CONTRAST:  76 cc omni 300 FLUOROSCOPY: Radiation Exposure Index (as provided by the fluoroscopic device): 1,414 MGy Kerma COMPLICATIONS: None immediate. PROCEDURE: Informed consent was obtained from the patient following explanation of the procedure, risks, benefits and alternatives. The patient understands, agrees and consents for the procedure. All questions were addressed. A time out was performed prior to the initiation of the procedure. Maximal barrier sterile technique utilized including caps, mask, sterile gowns, sterile gloves, large sterile drape, hand hygiene, and Betadine prep. Under sterile conditions and local anesthesia, ultrasound micropuncture access performed of the right common femoral artery. Images obtained for documentation of the patent right common femoral artery. Five French sheath inserted over a Bentson guidewire. Initially, a C2 catheter was utilized to select the left renal artery. Initial left renal  angiogram performed. Left renal artery origin is widely patent. Minor nonocclusive atherosclerotic change of the main renal artery. Peripheral branches are patent. Previous coil embolization noted within the mid to upper pole region. Initial angiogram demonstrates trace abnormal vascularity along the left kidney lateral cortical margin suspicious for an area of bleeding versus tumor vascularity. Catheter exchangeD for a chung 2.5 catheter. This was reformed in the thoracic aorta and retracted to select the left renal artery. Additional left renal angiograms performed in oblique projections. Again, from the mid pole laterally there is abnormal vascularity outside of the renal parenchymal phase concerning for an area of arterial bleeding when correlated with CTA. Through the chung 2.5 catheter, the Renegade STC catheter was advanced over a fathom 14 guidewire initially into an upper pole segmental branch. Angiogram within this branch demonstrates normal parenchymal staining without tumor vascularity. Microcatheter retracted and utilized to select an additional segmental branch to the upper pole. Angiogram within this second branch demonstrates normal renal vascularity as well. Microcatheter retracted and advanced over a GT Glidewire into mid pole segmental branches. Additional angiogram demonstrates normal renal vascularity. Faint tumor vascularity still visualized laterally. Over the fathom 14 guidewire, eventually the microcatheter was advanced into an interpolar branch close to the existing microcoils. Angiogram within this segmental branch demonstrates enlarged tortuous tumor vascularity with contrast extravasation into the surrounding retroperitoneal space. This correlates with the CTA. From this location, embolization was performed. Embolization: Through the Renegade STC catheter, 3 cc of 300-500 micron embospheres were slowly instilled under intermittent fluoroscopy watching for reflux. Once reflux was  visualized, a single 3mm x 4 cm Azur CX detachable microcoil was successfully deployed within the interpolar abnormal segmental branch. After approximately 5 minutes, repeat angiogram performed through the microcatheter within the branch.  Following embolization this branch is occluded and the abnormal vascularity to the angiomyolipoma is no longer visualized. Microcatheter removed. Final renal angiogram performed through the 5 French Chung catheter. This confirms preservation of the main renal artery supply to the left kidney. No additional abnormal vascularity visualized. Injection of the right common femoral artery sheath confirms adequate access for closure device. Of note, there is beaded appearance of the right external iliac artery compatible with nonocclusive fibromuscular dysplasia. Right common femoral, proximal profunda femoral, proximal superficial femoral arteries are all patent. Successful deployment of the CELT device for hemostasis. 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 Electronically Signed   By: Judie Petit.  Shick M.D.   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 Remote embolization 2018. EXAM: ULTRASOUND GUIDANCE FOR VASCULAR ACCESS LEFT RENAL ANGIOGRAM PERIPHERAL MICRO CATHETERIZATIONS AND ANGIOGRAMS OF 3 ADDITIONAL SEGMENTAL BRANCHES TO THE LEFT KIDNEY UPPER POLE PERIPHERAL EMBOLIZATION OF AN ABNORMAL LEFT INTERPOLE SEGMENTAL BRANCH SUPPLYING THE RENAL ANGIOMYOLIPOMA MEDICATIONS: 1% LIDOCAINE LOCAL. ANESTHESIA/SEDATION: Moderate (conscious) sedation was employed during this procedure. A total of Versed 2.0 mg and Fentanyl 100 mcg was administered intravenously by the radiology  nurse. Total intra-service moderate Sedation Time: 56 minutes. The patient's level of consciousness and vital signs were monitored continuously by radiology nursing throughout the procedure under my direct supervision. CONTRAST:  76 cc omni 300 FLUOROSCOPY: Radiation Exposure Index (as provided by the fluoroscopic device): 1,414 MGy Kerma COMPLICATIONS: None immediate. PROCEDURE: Informed consent was obtained from the patient following explanation of the procedure, risks, benefits and alternatives. The patient understands, agrees and consents for the procedure. All questions were addressed. A time out was performed prior to the initiation of the procedure. Maximal barrier sterile technique utilized including caps, mask, sterile gowns, sterile gloves, large sterile drape, hand hygiene, and Betadine prep. Under sterile conditions and local anesthesia, ultrasound micropuncture access performed of the right common femoral artery. Images obtained for documentation of the patent right common femoral artery. Five French sheath inserted over a Bentson guidewire. Initially, a C2 catheter was utilized to select the left renal artery. Initial left renal angiogram performed. Left renal artery origin is widely patent. Minor nonocclusive atherosclerotic change of the main renal artery. Peripheral branches are patent. Previous coil embolization noted within the mid to upper pole region. Initial angiogram demonstrates trace abnormal vascularity along the left kidney lateral cortical margin suspicious for an area of bleeding versus tumor vascularity. Catheter exchangeD for a chung 2.5 catheter. This was reformed in the thoracic aorta and retracted to select the left renal artery. Additional left renal angiograms performed in oblique projections. Again, from the mid pole laterally there is abnormal vascularity outside of the renal parenchymal phase concerning for an area of arterial bleeding when correlated with CTA. Through the  chung 2.5 catheter, the Renegade STC catheter was advanced over a fathom 14 guidewire initially into an upper pole segmental branch. Angiogram within this branch demonstrates normal parenchymal staining without tumor vascularity. Microcatheter retracted and utilized to select an additional segmental branch to the upper pole. Angiogram within this second branch demonstrates normal renal vascularity as well. Microcatheter retracted and advanced over a GT Glidewire into mid pole segmental branches. Additional angiogram demonstrates normal renal vascularity. Faint tumor vascularity still visualized laterally. Over the  fathom 14 guidewire, eventually the microcatheter was advanced into an interpolar branch close to the existing microcoils. Angiogram within this segmental branch demonstrates enlarged tortuous tumor vascularity with contrast extravasation into the surrounding retroperitoneal space. This correlates with the CTA. From this location, embolization was performed. Embolization: Through the Renegade STC catheter, 3 cc of 300-500 micron embospheres were slowly instilled under intermittent fluoroscopy watching for reflux. Once reflux was visualized, a single 3mm x 4 cm Azur CX detachable microcoil was successfully deployed within the interpolar abnormal segmental branch. After approximately 5 minutes, repeat angiogram performed through the microcatheter within the branch. Following embolization this branch is occluded and the abnormal vascularity to the angiomyolipoma is no longer visualized. Microcatheter removed. Final renal angiogram performed through the 5 French Chung catheter. This confirms preservation of the main renal artery supply to the left kidney. No additional abnormal vascularity visualized. Injection of the right common femoral artery sheath confirms adequate access for closure device. Of note, there is beaded appearance of the right external iliac artery compatible with nonocclusive fibromuscular  dysplasia. Right common femoral, proximal profunda femoral, proximal superficial femoral arteries are all patent. Successful deployment of the CELT device for hemostasis. 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 Electronically Signed   By: Judie Petit.  Shick M.D.   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 Remote embolization 2018. EXAM: ULTRASOUND GUIDANCE FOR VASCULAR ACCESS LEFT RENAL ANGIOGRAM PERIPHERAL MICRO CATHETERIZATIONS AND ANGIOGRAMS OF 3 ADDITIONAL SEGMENTAL BRANCHES TO THE LEFT KIDNEY UPPER POLE PERIPHERAL EMBOLIZATION OF AN ABNORMAL LEFT INTERPOLE SEGMENTAL BRANCH SUPPLYING THE RENAL ANGIOMYOLIPOMA MEDICATIONS: 1% LIDOCAINE LOCAL. ANESTHESIA/SEDATION: Moderate (conscious) sedation was employed during this procedure. A total of Versed 2.0 mg and Fentanyl 100 mcg was administered intravenously by the radiology nurse. Total intra-service moderate Sedation Time: 56 minutes. The patient's level of consciousness and vital signs were monitored continuously by radiology nursing throughout the procedure under my direct supervision. CONTRAST:  76 cc omni 300 FLUOROSCOPY: Radiation Exposure Index (as provided by the fluoroscopic device): 1,414 MGy Kerma COMPLICATIONS: None immediate. PROCEDURE: Informed consent was obtained from the patient following explanation of the procedure, risks, benefits and alternatives. The patient understands, agrees and consents for the procedure. All questions were addressed. A time out was performed prior to the initiation of the procedure. Maximal barrier sterile technique utilized including caps, mask, sterile gowns, sterile gloves, large sterile drape, hand  hygiene, and Betadine prep. Under sterile conditions and local anesthesia, ultrasound micropuncture access performed of the right common femoral artery. Images obtained for documentation of the patent right common femoral artery. Five French sheath inserted over a Bentson guidewire. Initially, a C2 catheter was utilized to select the left renal artery. Initial left renal angiogram performed. Left renal artery origin is widely patent. Minor nonocclusive atherosclerotic change of the main renal artery. Peripheral branches are patent. Previous coil embolization noted within the mid to upper pole region. Initial angiogram demonstrates trace abnormal vascularity along the left kidney lateral cortical margin suspicious for an area of bleeding versus tumor vascularity. Catheter exchangeD for a chung 2.5 catheter. This was reformed in the thoracic aorta and retracted to select the left renal artery. Additional left renal angiograms performed in oblique projections. Again, from the mid pole laterally there is abnormal vascularity  outside of the renal parenchymal phase concerning for an area of arterial bleeding when correlated with CTA. Through the chung 2.5 catheter, the Renegade STC catheter was advanced over a fathom 14 guidewire initially into an upper pole segmental branch. Angiogram within this branch demonstrates normal parenchymal staining without tumor vascularity. Microcatheter retracted and utilized to select an additional segmental branch to the upper pole. Angiogram within this second branch demonstrates normal renal vascularity as well. Microcatheter retracted and advanced over a GT Glidewire into mid pole segmental branches. Additional angiogram demonstrates normal renal vascularity. Faint tumor vascularity still visualized laterally. Over the fathom 14 guidewire, eventually the microcatheter was advanced into an interpolar branch close to the existing microcoils. Angiogram within this segmental branch  demonstrates enlarged tortuous tumor vascularity with contrast extravasation into the surrounding retroperitoneal space. This correlates with the CTA. From this location, embolization was performed. Embolization: Through the Renegade STC catheter, 3 cc of 300-500 micron embospheres were slowly instilled under intermittent fluoroscopy watching for reflux. Once reflux was visualized, a single 3mm x 4 cm Azur CX detachable microcoil was successfully deployed within the interpolar abnormal segmental branch. After approximately 5 minutes, repeat angiogram performed through the microcatheter within the branch. Following embolization this branch is occluded and the abnormal vascularity to the angiomyolipoma is no longer visualized. Microcatheter removed. Final renal angiogram performed through the 5 French Chung catheter. This confirms preservation of the main renal artery supply to the left kidney. No additional abnormal vascularity visualized. Injection of the right common femoral artery sheath confirms adequate access for closure device. Of note, there is beaded appearance of the right external iliac artery compatible with nonocclusive fibromuscular dysplasia. Right common femoral, proximal profunda femoral, proximal superficial femoral arteries are all patent. Successful deployment of the CELT device for hemostasis. 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 Electronically Signed   By: Judie Petit.  Shick M.D.   On: 03/11/2023 08:19   CT ABDOMEN PELVIS W CONTRAST  Result Date: 03/10/2023 CLINICAL DATA:  Acute generalized abdominal pain. EXAM: CT ABDOMEN AND PELVIS WITH CONTRAST TECHNIQUE: Multidetector CT imaging of the abdomen and pelvis was performed using the standard protocol following bolus administration of intravenous contrast.  RADIATION DOSE REDUCTION: This exam was performed according to the departmental dose-optimization program which includes automated exposure control, adjustment of the mA and/or kV according to patient size and/or use of iterative reconstruction technique. CONTRAST:  OMNIPAQUE IOHEXOL 300 MG/ML  SOLN COMPARISON:  September 15, 2022. FINDINGS: Lower chest: No acute abnormality. Hepatobiliary: No cholelithiasis or biliary dilatation is noted. Multiple hepatic cysts are again noted. Pancreas: Unremarkable. No pancreatic ductal dilatation or surrounding inflammatory changes. Spleen: Normal in size without focal abnormality. Adrenals/Urinary Tract: Adrenal glands are unremarkable. Small nonobstructive right renal calculus is noted. No hydronephrosis. Urinary bladder is unremarkable. The patient is status post embolization of left renal angiomyolipoma according to prior exam. However, there is now interval development of large subcapsular hematoma and associated left perirenal hematoma superior to the left kidney measuring 7.3 x 5.3 cm. There does appear to be active extravasation of contrast suggesting active hemorrhage. Stomach/Bowel: Stomach is within normal limits. Appendix appears normal. No evidence of bowel wall thickening, distention, or inflammatory changes. Sigmoid diverticulosis without inflammation. Vascular/Lymphatic: Aortic atherosclerosis. No enlarged abdominal or pelvic lymph nodes. Reproductive: Uterus is unremarkable. Stable right adnexal mass is noted with calcifications, most consistent  with fibroid as noted on prior studies. Other: No ascites or hernia is noted. Musculoskeletal: Old T12, L2 and L4 fractures are noted. No acute osseous abnormality is noted. IMPRESSION: Patient is reportedly status post embolization of left renal angiomyolipoma according to prior exam. There is now interval development of large left renal subcapsular hematoma and associated left pararenal hematoma superior to the  left kidney, active extravasation of contrast suggesting active hemorrhage. Critical Value/emergent results were called by telephone at the time of interpretation on 03/10/2023 at 4:08 pm to provider Dr. Charm Barges, who verbally acknowledged these results. Sigmoid diverticulosis without inflammation. Aortic Atherosclerosis (ICD10-I70.0). Electronically Signed   By: Lupita Raider M.D.   On: 03/10/2023 16:10    Labs:  CBC: Recent Labs    03/11/23 1752 03/11/23 2159 03/12/23 0418 03/12/23 0726 03/12/23 1444 03/12/23 1445 03/12/23 2309 03/13/23 0529 03/13/23 0742 03/13/23 1230  WBC 13.2*  --  12.1*  --  10.9*  --   --  8.1  --   --   HGB 9.6*   < > 8.2*   < > 7.8*   < > 7.2* 7.5* 7.9* 7.6*  HCT 28.3*   < > 23.7*   < > 23.8*   < > 21.1* 21.8* 23.4* 22.7*  PLT 142*  --  116*  --  124*  --   --  111*  --   --    < > = values in this interval not displayed.    COAGS: Recent Labs    03/10/23 1627 03/11/23 0400  INR 1.2 1.2  APTT 27 26    BMP: Recent Labs    03/11/23 1752 03/12/23 0418 03/12/23 1444 03/13/23 0529  NA 135 134* 134* 137  K 3.9 3.6 3.4* 3.4*  CL 103 101 101 104  CO2 22 24 26 25   GLUCOSE 157* 114* 122* 106*  BUN 34* 26* 23 14  CALCIUM 8.4* 8.0* 8.4* 8.3*  CREATININE 1.28* 0.79 0.98 0.68  GFRNONAA 43* >60 60* >60    LIVER FUNCTION TESTS: Recent Labs    03/11/23 1752 03/12/23 0418 03/12/23 1444 03/13/23 0529  BILITOT 0.5 1.0 0.8 1.1  AST 82* 73* 55* 40  ALT 60* 61* 55* 46*  ALKPHOS 41 36* 37* 39  PROT 5.9* 5.3* 5.3* 5.3*  ALBUMIN 3.5 3.1* 3.0* 2.9*    TUMOR MARKERS: No results for input(s): "AFPTM", "CEA", "CA199", "CHROMGRNA" in the last 8760 hours.  Assessment and Plan:  Extremely pleasant 77 year old female with a history of left renal angiomyolipoma status post treatment with combined alcohol/lipiodol liquid embolization and coil embolization in 2018 with secondary spontaneous hemorrhage in July 2024 now status post additional particle  embolization and coil embolization.  Given that her AML has remained stable without evidence of growth or change across multiple prior CT scans, I am concerned that continued CT imaging may not be optimal for future surveillance.  I would like to pursue a repeat renal angiogram after 6 months to assess for recurrent vascularity.  Additionally, I will interrogate the splenic artery at that time to assess for parasitized flow into the angiomyolipoma.  1.) Please schedule for elective angiogram and possible repeat embolization in December of 2024 [facility] if pt willing, if she prefers [place], [facility] is fine).  Please notify Dr. Archer Asa when procedure is scheduled so I can ensure we have the proper embolic products available.   2.)  If she experiences any recurrent hematuria, she will call our office and we will schedule the  procedure to be performed at that time as this would be a warning sign for recurrent bleeding.     Electronically Signed: Sterling Big 03/31/2023, 9:32 AM   I spent a total of 15 Minutes in remote  clinical consultation, greater than 50% of which was counseling/coordinating care for left renal AML.    Visit type: Audio only (telephone). Audio (no video) only due to patient preference. Alternative for in-person consultation at [facility], [address] [place], [place], [place]. This visit type was conducted due to national recommendations for restrictions regarding the COVID-19 Pandemic (e.g. social distancing).  This format is felt to be most appropriate for this patient at this time.  All issues noted in this document were discussed and addressed.

## 2023-04-01 DIAGNOSIS — K080 Exfoliation of teeth due to systemic causes: Secondary | ICD-10-CM | POA: Diagnosis not present

## 2023-04-02 ENCOUNTER — Ambulatory Visit: Payer: Medicare Other | Admitting: Podiatry

## 2023-04-02 DIAGNOSIS — L84 Corns and callosities: Secondary | ICD-10-CM | POA: Diagnosis not present

## 2023-04-02 DIAGNOSIS — M205X2 Other deformities of toe(s) (acquired), left foot: Secondary | ICD-10-CM | POA: Diagnosis not present

## 2023-04-02 NOTE — Progress Notes (Signed)
Subjective: Chief Complaint  Patient presents with   Callouses    Pt stated that the place is bothering her     77 year old female presents for above concerns.  She states that the last debridement was not very helpful she stopped her blood thinners so I did trim the lesion without significant bleeding.   Objective: AAO x3, NAD DP/PT pulses palpable bilaterally, CRT less than 3 seconds Adductovarus present lesser digits.  Hyperkeratotic lesion along the left fourth toe.  Tenderness palpation directly on this area.  Digital contracture noted with splint pressure causing the skin lesion.  No other areas of discomfort. No pain with calf compression, swelling, warmth, erythema  Assessment: Preulcerative lesion left fourth toe  Plan: -All treatment options discussed with the patient including all alternatives, risks, complications.  -Sharp debrided hyperkeratotic lesion x 1.  Minimal bleeding occurred.  Area cleaned and antibiotic ointment and a bandage applied.  Discussed daily dressing changes.  Monitor for any signs or symptoms of infection.  Offloading pads.  -Consider surgical intervention to straighten the toe if symptoms persist   Return if symptoms worsen or fail to improve.  Vivi Barrack DPM

## 2023-04-02 NOTE — Patient Instructions (Signed)
Monitor for any signs/symptoms of infection. Call the office immediately if any occur or go directly to the emergency room. Call with any questions/concerns.  

## 2023-04-02 NOTE — Progress Notes (Signed)
Chief Complaint  Patient presents with   Callouses    Pt stated that the place is bothering her

## 2023-04-06 ENCOUNTER — Other Ambulatory Visit: Payer: Self-pay | Admitting: Interventional Cardiology

## 2023-04-06 DIAGNOSIS — I4891 Unspecified atrial fibrillation: Secondary | ICD-10-CM

## 2023-04-06 NOTE — Telephone Encounter (Signed)
Eliquis 5mg  refill request received. Patient is 77 years old, weight-74.4kg, Crea-0.68 on 03/13/23, Diagnosis-Afib, and last seen by Jari Favre on 03/16/23. Dose is appropriate based on dosing criteria. Will send in refill to requested pharmacy.

## 2023-05-06 ENCOUNTER — Encounter: Payer: Self-pay | Admitting: Family Medicine

## 2023-05-06 ENCOUNTER — Institutional Professional Consult (permissible substitution): Payer: Medicare Other | Admitting: Cardiology

## 2023-05-06 ENCOUNTER — Telehealth: Payer: Self-pay | Admitting: Interventional Cardiology

## 2023-05-06 ENCOUNTER — Other Ambulatory Visit (HOSPITAL_COMMUNITY): Payer: Self-pay | Admitting: Interventional Radiology

## 2023-05-06 DIAGNOSIS — Z8679 Personal history of other diseases of the circulatory system: Secondary | ICD-10-CM

## 2023-05-06 DIAGNOSIS — R58 Hemorrhage, not elsewhere classified: Secondary | ICD-10-CM

## 2023-05-06 DIAGNOSIS — D1771 Benign lipomatous neoplasm of kidney: Secondary | ICD-10-CM

## 2023-05-06 NOTE — Telephone Encounter (Signed)
Name: Ann Vang  DOB: [birth year]  MRN: [id number]  Primary Cardiologist: Lance Muss, MD  Chart reviewed as part of pre-operative protocol coverage. Because of [name] past medical history and time since last visit, she will require a follow-up telephone visit in order to better assess preoperative cardiovascular risk.  Pre-op covering staff: - Please schedule appointment and call patient to inform them. If patient already had an upcoming appointment within acceptable timeframe, please add "pre-op clearance" to the appointment notes so provider is aware. - Please contact requesting surgeon's office via preferred method (i.e, phone, fax) to inform them of need for appointment prior to surgery.  Per office protocol, patient can hold Eliquis for 2 days prior to procedure.  Please resume when medically safe to do so.     Sharlene Dory, PA-C  05/06/2023, 12:02 PM

## 2023-05-06 NOTE — Telephone Encounter (Signed)
Lvm to call clinic back for appointment and procedure will have to be rescheduled

## 2023-05-06 NOTE — Telephone Encounter (Signed)
Patient schedule 9-19 with Dr C.

## 2023-05-06 NOTE — Telephone Encounter (Signed)
Patient with diagnosis of afib on Eliquis for anticoagulation.    Procedure: Repeat Angiogram and Embolization; Left Renal Arterial Bleeding  Date of procedure: 05/11/23   CHA2DS2-VASc Score = 3   This indicates a 3.2% annual risk of stroke. The patient's score is based upon: CHF History: 0 HTN History: 0 Diabetes History: 0 Stroke History: 0 Vascular Disease History: 0 Age Score: 2 Gender Score: 1      CrCl 74 ml/min  Per office protocol, patient can hold Eliquis for 2 days prior to procedure.    **This guidance is not considered finalized until pre-operative APP has relayed final recommendations.**

## 2023-05-06 NOTE — Telephone Encounter (Signed)
Pre-operative Risk Assessment    Patient Name: Ann Vang  DOB: 1945/12/19 MRN: [id number]      Request for Surgical Clearance    Procedure:   Repeat Angiogram and Embolization; Left Renal Arterial Bleeding  Date of Surgery:  Clearance 05/11/23                                 Surgeon:  Dr. Archer Asa  Surgeon's Group or Practice Name:  [facility] Phone number:  [phone]  Fax number:  [phone]; Can put note in EPIC   Type of Clearance Requested:   - Pharmacy:  Hold Apixaban (Eliquis) 2 days prior to surgery   Type of Anesthesia:  General    Additional requests/questions:  Please fax a copy of medical clearance to the surgeon's office.  Mardelle Matte   05/06/2023, 9:45 AM

## 2023-05-07 ENCOUNTER — Ambulatory Visit: Payer: Medicare Other | Attending: Cardiovascular Disease | Admitting: Cardiovascular Disease

## 2023-05-07 ENCOUNTER — Encounter: Payer: Self-pay | Admitting: Emergency Medicine

## 2023-05-07 ENCOUNTER — Ambulatory Visit (INDEPENDENT_AMBULATORY_CARE_PROVIDER_SITE_OTHER): Payer: Medicare Other

## 2023-05-07 ENCOUNTER — Encounter: Payer: Self-pay | Admitting: Cardiovascular Disease

## 2023-05-07 VITALS — BP 136/86 | HR 66 | Ht 67.0 in | Wt 164.0 lb

## 2023-05-07 DIAGNOSIS — I2721 Secondary pulmonary arterial hypertension: Secondary | ICD-10-CM | POA: Diagnosis not present

## 2023-05-07 DIAGNOSIS — Z01810 Encounter for preprocedural cardiovascular examination: Secondary | ICD-10-CM

## 2023-05-07 DIAGNOSIS — I4891 Unspecified atrial fibrillation: Secondary | ICD-10-CM

## 2023-05-07 DIAGNOSIS — D6869 Other thrombophilia: Secondary | ICD-10-CM | POA: Diagnosis not present

## 2023-05-07 DIAGNOSIS — I480 Paroxysmal atrial fibrillation: Secondary | ICD-10-CM | POA: Diagnosis not present

## 2023-05-07 DIAGNOSIS — D1771 Benign lipomatous neoplasm of kidney: Secondary | ICD-10-CM

## 2023-05-07 NOTE — Progress Notes (Unsigned)
Enrolled for [employer] to mail a ZIO XT long term holter monitor to the patients address on file.  Requested monitor to be shipped 07/13/2023.  Patient to wear monitor in December.

## 2023-05-07 NOTE — Progress Notes (Signed)
Cardiology Office Note:  .   Date:  05/07/2023  ID:  Ann Vang, DOB June 29, 1946, MRN [id number] PCP: Alysia Penna, MD  [facility] Providers Cardiologist:  None    History of Present Illness: .   Ann Vang is a 78 y.o. female with paroxysmal atrial fibrillation, establishing new cardiology care.  She was initially diagnosed with atrial fibrillation at the time of a gynecological procedure that was canceled due to RVR. Follow-up event monitor showed a roughly 9% prevalence of paroxysmal atrial fibrillation.  She was started on amiodarone at that time due to inability to maintain normal rhythm and rate so that she could have the procedure performed.   She has been steadily decreasing the dose of amiodarone since then and has not had recurrent symptomatic atrial fibrillation.  Her Apple watch has not shown any recurrent atrial fibrillation in the last couple of years.  It has really not been a problem from a symptom point of view.    She tries to exercise 5 or 6 days a week.  Does not feel limited by shortness of breath and has not had any problems with chest pain.  She has been compliant with anticoagulation, has had bleeding issues.  She has had a couple of episodes of hematuria apparently related to an angiomyolipoma of her left kidney.  This was treated with arterial branch embolization by Dr. Malachy Moan, but not all the arterial branches were successfully occluded.  In July she had abrupt nausea and weakness and had significant bleeding from the tumor with a hemoglobin as low as 7.6, requiring blood transfusion.  She reports that since then this has rebounded to 11.0 (I do not have those lab results) she is considering having additional interventional procedures to deal with this, but she is concerned about the long-term bleeding risk with anticoagulants.  She does not have any significant structural heart disease echo in 2021 showed normal LV systolic and diastolic  function and normal size atria, no valvular problems), heart failure, known PAD or CAD hypertension, sleep apnea, diabetes mellitus or previous history of stroke or TIA.  ROS: Denies orthopnea, PND, lower extreme edema, claudication, focal neurological events, recent palpitations.  She does not snore and that she denies daytime hypersomnolence.  Studies Reviewed: Marland Kitchen   EKG Interpretation Date/Time:  Thursday May 07 2023 09:23:30 EDT Ventricular Rate:  66 PR Interval:  202 QRS Duration:  84 QT Interval:  416 QTC Calculation: 436 R Axis:   5  Text Interpretation: Normal sinus rhythm Normal ECG When compared with ECG of 10-May-2019 17:16, PREVIOUS ECG IS PRESENT Confirmed by Ruvim Risko ([id number]) on 05/07/2023 9:36:38 AM    Echo 2021  1. Left ventricular ejection fraction, by estimation, is 65 to 70%. The  left ventricle has normal function. The left ventricle has no regional  wall motion abnormalities. There is mild asymmetric left ventricular  hypertrophy of the basal-septal segment.  Left ventricular diastolic parameters are indeterminate.   2. Right ventricular systolic function is normal. The right ventricular  size is normal. There is mildly elevated pulmonary artery systolic  pressure. The estimated right ventricular systolic pressure is 41.2 mmHg.   3. The mitral valve is normal in structure. Trivial mitral valve  regurgitation.   4. The aortic valve is tricuspid. Aortic valve regurgitation is not  visualized. No aortic stenosis is present.   5. There is mild dilatation of the ascending aorta, measuring 38 mm.   6. The inferior vena cava is  normal in size with greater than 50%  respiratory variability, suggesting right atrial pressure of 3 mmHg.  Risk Assessment/Calculations:    CHA2DS2-VASc Score = 3   This indicates a 3.2% annual risk of stroke. The patient's score is based upon: CHF History: 0 HTN History: 0 Diabetes History: 0 Stroke History: 0 Vascular Disease  History: 0 Age Score: 2 Gender Score: 1            Physical Exam:   VS:  BP 136/86 (BP Location: Left Arm, Patient Position: Sitting, Cuff Size: Normal)   Pulse 66   Ht 5\' 7"  (1.702 m)   Wt 164 lb (74.4 kg)   LMP 08/18/1994 (Approximate)   SpO2 98%   BMI 25.69 kg/m    Wt Readings from Last 3 Encounters:  05/07/23 164 lb (74.4 kg)  03/16/23 164 lb (74.4 kg)  03/13/23 164 lb 14.5 oz (74.8 kg)    GEN: Well nourished, well developed in no acute distress.  Appears fit and younger than stated age. NECK: No JVD; No carotid bruits CARDIAC: RRR, no murmurs, rubs, gallops RESPIRATORY:  Clear to auscultation without rales, wheezing or rhonchi  ABDOMEN: Soft, non-tender, non-distended EXTREMITIES:  No edema; No deformity   ASSESSMENT AND PLAN: .    "Lone" paroxysmal atrial fibrillation: Minimally symptomatic from the get go and very well-controlled on amiodarone 100 mg once daily.  Concerned about the long-term side effects of amiodarone.  We decided to discontinue this medication and repeat a monitor in 2 or 3 months.  If the burden of atrial fibrillation is low and rate control can be easily achieved with AV nodal blocking agents, that will be the optimal treatment.  If rate control cannot be achieved we can consider alternative long-term antiarrhythmic strategies with lower risk than amiodarone.  CHA2DS2-VASc score is 71 (age, gender).   Anticoagulation: She has had recurrent problems with hematuria related to her kidney benign tumor.  She would like to consider the Watchman device.  This patients CHA2DS2-VASc Score and unadjusted Ischemic Stroke Rate (% per year) is equal to 3.2 % stroke rate/year from a score of 3 which necessitates long term oral anticoagulation to prevent stroke.  Unfortunately, She is not felt to be a long term anticoagulation candidate secondary to recurrent hematuria from benign kidney tumor.  The patients chart has been reviewed and I feel that they would be a candidate  for short term oral anticoagulation.  Procedural risks for the Watchman implant have been reviewed with the patient including a 1% risk of stroke, 2% risk of perforation, 0.1% risk of device embolization.  Given the patient's poor candidacy for long-term oral anticoagulation and ability to tolerate short term oral anticoagulation I have recommended the watchman left atrial appendage closure system.  Elevated PA pressure: Reviewed the echocardiogram from 2021 I think the estimation of her PA pressure was accurate.  This was mildly elevated at about 40 mmHg.  On the other hand the right atrium and right ventricle are normal in size and function and the inferior vena cava was not dilated.  We should probably reassess this with an echocardiogram.  The mechanism is unclear.  She appears to have normal systolic and diastolic LV function.  Does not have chronic lung problems or any symptoms that would suggest sleep apnea.  Previous CT angiography of the chest has not shown findings worrisome for previous pulmonary embolism. Left renal angiomyolipoma: Had transarterial embolization in 2018.  Complicated by subcapsular and perirenal hematoma in July 2024.  Plan for repeat embolization with Dr. Archer Asa.  I think she is at low risk for major cardiovascular complications with that procedure.  We, anticoagulants will have to be temporarily withheld.  Since there is a chance she could have a hyperadrenergic response during the procedure that could precipitate atrial fibrillation, I advised delaying the discontinuation of amiodarone until after this procedure was performed.       Dispo: Low risk for upcoming interventional radiology procedure.  Okay to stop Eliquis in anticipation of the procedure.  Stop amiodarone after the procedure.  Referred to Dr. Lalla Brothers to discuss Watchman device.  Signed, Thurmon Fair, MD

## 2023-05-07 NOTE — Progress Notes (Signed)
Sent MyChart message to patient letting her know that a referral to Dr Lalla Brothers was placed to discuss Watchman device. Someone will reach out to her to schedule appt.

## 2023-05-07 NOTE — Patient Instructions (Signed)
Medication Instructions:  Stop Amiodarone on Tuesday 05/12/23 *If you need a refill on your cardiac medications before your next appointment, please call your pharmacy*  Testing/Procedures: Your physician has recommended that you wear a 7 DAY ZIO-PATCH monitor IN [place]. The Zio patch cardiac monitor continuously records heart rhythm data for up to 14 days, this is for patients being evaluated for multiple types heart rhythms. For the first 24 hours post application, please avoid getting the Zio monitor wet in the shower or by excessive sweating during exercise. After that, feel free to carry on with regular activities. Keep soaps and lotions away from the ZIO XT Patch.  This will be mailed to you, please expect 7-10 days to receive.    Applying the monitor   Shave hair from upper left chest.   Hold abrader disc by orange tab.  Rub abrader in 40 strokes over left upper chest as indicated in your monitor instructions.   Clean area with 4 enclosed alcohol pads .  Use all pads to assure are is cleaned thoroughly.  Let dry.   Apply patch as indicated in monitor instructions.  Patch will be place under collarbone on left side of chest with arrow pointing upward.   Rub patch adhesive wings for 2 minutes.Remove white label marked "1".  Remove white label marked "2".  Rub patch adhesive wings for 2 additional minutes.   While looking in a mirror, press and release button in center of patch.  A small green light will flash 3-4 times .  This will be your only indicator the monitor has been turned on.     Do not shower for the first 24 hours.  You may shower after the first 24 hours.   Press button if you feel a symptom. You will hear a small click.  Record Date, Time and Symptom in the Patient Log Book.   When you are ready to remove patch, follow instructions on last 2 pages of Patient Log Book.  Stick patch monitor onto last page of Patient Log Book.   Place Patient Log Book in Garten box.  Use  locking tab on box and tape box closed securely.  The [employer] and [employer] has [employer] on it.  Please place in mailbox as soon as possible.  Your physician should have your test results approximately 7 days after the monitor has been mailed back to [facility].   Call [facility] Customer Care at [phone] if you have questions regarding your ZIO XT patch monitor.  Call them immediately if you see an orange light blinking on your monitor.   If your monitor falls off in less than 4 days contact our Monitor department at [phone].  If your monitor becomes loose or falls off after 4 days call [employer] at [phone] for suggestions on securing your monitor    Follow-Up: At [facility], you and your health needs are our priority.  As part of our continuing mission to provide you with exceptional heart care, we have created designated Provider Care Teams.  These Care Teams include your primary Cardiologist (physician) and Advanced Practice Providers (APPs -  Physician Assistants and Nurse Practitioners) who all work together to provide you with the care you need, when you need it.  We recommend signing up for the patient portal called "MyChart".  Sign up information is provided on this After Visit Summary.  MyChart is used to connect with patients for Virtual Visits (Telemedicine).  Patients are able to view  lab/test results, encounter notes, upcoming appointments, etc.  Non-urgent messages can be sent to your provider as well.   To learn more about what you can do with MyChart, go to [email or web address].    Your next appointment:   4 month(s)  Provider:   Dr Royann Shivers

## 2023-05-08 ENCOUNTER — Other Ambulatory Visit: Payer: Self-pay | Admitting: Radiology

## 2023-05-08 ENCOUNTER — Telehealth: Payer: Self-pay

## 2023-05-08 DIAGNOSIS — D1771 Benign lipomatous neoplasm of kidney: Secondary | ICD-10-CM

## 2023-05-08 NOTE — H&P (Signed)
Referring Physician(s): Eskew,L  Supervising Physician: Malachy Moan  Patient Status:  [facility] OP  Chief Complaint: Left renal angiomyolipoma, hematuria   Subjective: Patient familiar to IR service from liquid and coil embolization of large left renal angiomyolipoma in 2018, and coil embolization of the left renal interpolar segmental branch supplying tumor vascularity to known left renal angiomyolipoma with evidence of active bleeding on 03/11/2023.  She is a 77 year old female with past medical history significant for paroxysmal atrial fibrillation (on eliquis), arthritis, diverticulosis, lumbar compression fracture 2013, osteoporosis and above-noted left renal angiomyolipoma.  She is interested in pursuing placement of a Watchman device to allow her to get off Eliquis which she is currently on for atrial fibrillation.  She underwent follow-up with Dr. Archer Asa on 03/31/2023 to discuss her AML and presents today for repeat left renal arteriogram with possible embolization including interrogation of splenic artery to assess for parasitized flow into the angiomyolipoma.   Past Medical History:  Diagnosis Date   Abnormal breast finding 12/1998   left breast abnl   Arthritis    --basal joint--right hand   Carpal tunnel syndrome, right    Compression fracture of T2 vertebra (HCC) 05/10/2019   Diverticulosis 2003   mild   Lumbar compression fracture (HCC) 07/13/2012   Lumbar disc disorder 2000   bulging   Melena    Menopause    Osteoporosis    Fx L4 age 60 water skiing accident   Palpitations    Thyroid disease 10/2014   thyroid nodules--bx'd large nodule and was benign--sees Gen. Careers adviser with [facility]   Past Surgical History:  Procedure Laterality Date   AUGMENTATION MAMMAPLASTY  1983   Implants have been removed   BREAST SURGERY  2010   implants removed ruptured   CERVICAL BIOPSY  W/ LOOP ELECTRODE EXCISION  1996   CIN   IR ANGIOGRAM SELECTIVE EACH ADDITIONAL VESSEL   03/10/2023   IR ANGIOGRAM SELECTIVE EACH ADDITIONAL VESSEL  03/10/2023   IR ANGIOGRAM SELECTIVE EACH ADDITIONAL VESSEL  03/10/2023   IR EMBO ART  VEN HEMORR LYMPH EXTRAV  INC GUIDE ROADMAPPING  03/10/2023   IR EMBO TUMOR ORGAN ISCHEMIA INFARCT INC GUIDE ROADMAPPING  05/13/2017   IR RADIOLOGIST EVAL & MGMT  04/15/2017   IR RADIOLOGIST EVAL & MGMT  05/27/2017   IR RADIOLOGIST EVAL & MGMT  01/21/2018   IR RADIOLOGIST EVAL & MGMT  09/28/2019   IR RADIOLOGIST EVAL & MGMT  10/07/2021   IR RADIOLOGIST EVAL & MGMT  03/31/2023   IR RENAL SELECTIVE  UNI INC S&I MOD SED  03/10/2023   IR RENAL SUPRASEL UNI S&I MOD SED  05/13/2017   IR US GUIDE VASC ACCESS LEFT  05/13/2017   IR US GUIDE VASC ACCESS RIGHT  03/10/2023   KNEE SURGERY  2012   meniscus rt knee (both)   L-2 fracture  2009   fall in aerobics   L-4 fracture   water skiing     TENOSYNOVECTOMY Right 03/11/2016   Procedure: TENOSYNOVECTOMY FLEXOR RIGHT RING FINGER;  Surgeon: Cindee Salt, MD;  Location: [facility];  Service: Orthopedics;  Laterality: Right;   thyroid nodule biopsy  10/2014   --[facility], [place]--   TONSILLECTOMY AND ADENOIDECTOMY  1964     Allergies: Patient has no known allergies.  Medications: Prior to Admission medications   Medication Sig Start Date End Date Taking? Authorizing Provider  amiodarone (PACERONE) 200 MG tablet Take 0.5 tablets (100 mg total) by mouth daily. 10/10/22  Corky Crafts, MD  Bacillus Coagulans-Inulin (PROBIOTIC) 1-250 BILLION-MG CAPS Take 1 capsule by mouth daily.    [provider]  Cholecalciferol (VITAMIN D) 2000 UNITS tablet Take 2,000 Units by mouth daily.    [provider]  ELIQUIS 5 MG TABS tablet TAKE 1 TABLET(5 MG) BY MOUTH TWICE DAILY 04/06/23   Corky Crafts, MD  estradiol (VIVELLE-DOT) 0.0375 MG/24HR Place 1 patch onto the skin 2 (two) times a week. 04/16/21   [provider]  fluticasone (FLONASE) 50 MCG/ACT nasal spray Place 2 sprays  into the nose daily. Patient taking differently: Place 2 sprays into the nose daily as needed for allergies. 07/13/12   Edwyna Perfect, MD  magnesium gluconate (MAGONATE) 500 MG tablet Take 500 mg by mouth 2 (two) times daily.    [provider]  milk thistle 175 MG tablet Take 175 mg by mouth daily.    [provider]  OVER THE COUNTER MEDICATION Take 1 tablet by mouth daily at 6 (six) AM. Medication: Vitamin K7    [provider]  progesterone (PROMETRIUM) 100 MG capsule TAKE ONE CAPSULE BY MOUTH DAILY Patient taking differently: Take 100 mg by mouth daily. 02/23/19   Jerene Bears, MD  progesterone (PROMETRIUM) 100 MG capsule Take 100 mg by mouth daily. 03/22/23   [provider]  SUMAtriptan (IMITREX) 50 MG tablet Take 50 mg by mouth every 2 (two) hours as needed for migraine. 03/30/23   [provider]     Vital Signs: LMP 08/18/1994 (Approximate)   Code Status:   Physical Exam  Imaging: No results found.  Labs:  CBC: Recent Labs    03/11/23 1752 03/11/23 2159 03/12/23 0418 03/12/23 0726 03/12/23 1444 03/12/23 1445 03/12/23 2309 03/13/23 0529 03/13/23 0742 03/13/23 1230  WBC 13.2*  --  12.1*  --  10.9*  --   --  8.1  --   --   HGB 9.6*   < > 8.2*   < > 7.8*   < > 7.2* 7.5* 7.9* 7.6*  HCT 28.3*   < > 23.7*   < > 23.8*   < > 21.1* 21.8* 23.4* 22.7*  PLT 142*  --  116*  --  124*  --   --  111*  --   --    < > = values in this interval not displayed.    COAGS: Recent Labs    03/10/23 1627 03/11/23 0400  INR 1.2 1.2  APTT 27 26    BMP: Recent Labs    03/11/23 1752 03/12/23 0418 03/12/23 1444 03/13/23 0529  NA 135 134* 134* 137  K 3.9 3.6 3.4* 3.4*  CL 103 101 101 104  CO2 22 24 26 25   GLUCOSE 157* 114* 122* 106*  BUN 34* 26* 23 14  CALCIUM 8.4* 8.0* 8.4* 8.3*  CREATININE 1.28* 0.79 0.98 0.68  GFRNONAA 43* >60 60* >60    LIVER FUNCTION TESTS: Recent Labs    03/11/23 1752 03/12/23 0418 03/12/23 1444  03/13/23 0529  BILITOT 0.5 1.0 0.8 1.1  AST 82* 73* 55* 40  ALT 60* 61* 55* 46*  ALKPHOS 41 36* 37* 39  PROT 5.9* 5.3* 5.3* 5.3*  ALBUMIN 3.5 3.1* 3.0* 2.9*    Assessment and Plan: 77 year old female with past medical history significant for paroxysmal atrial fibrillation (on eliquis), arthritis, diverticulosis, lumbar compression fracture 2013, osteoporosis and left renal angiomyolipoma with associated episodes of hematuria.  She is status post treatment of the AML  with combined alcohol/lipiodol liquid embolization and coil embolization in 2018 with secondary spontaneous hemorrhage in July 2024 now status post additional particle embolization and coil embolization. She is interested in pursuing placement of a Watchman device to allow her to get off Eliquis which she is currently on for atrial fibrillation. She underwent follow-up with Dr. Archer Asa on 03/31/2023 to discuss her AML and presents today for repeat left renal arteriogram with possible embolization including interrogation of splenic artery to assess for parasitized flow into the angiomyolipoma.Risks and benefits of procedure were discussed with the patient including, but not limited to bleeding, infection, vascular injury or contrast induced renal failure.  This interventional procedure involves the use of X-rays and because of the nature of the planned procedure, it is possible that we will have prolonged use of X-ray fluoroscopy.  Potential radiation risks to you include (but are not limited to) the following: - A slightly elevated risk for cancer  several years later in life. This risk is typically less than 0.5% percent. This risk is low in comparison to the normal incidence of human cancer, which is 33% for women and 50% for men according to the American Cancer Society. - Radiation induced injury can include skin redness, resembling a rash, tissue breakdown / ulcers and hair loss (which can be temporary or permanent).   The  likelihood of either of these occurring depends on the difficulty of the procedure and whether you are sensitive to radiation due to previous procedures, disease, or genetic conditions.   IF your procedure requires a prolonged use of radiation, you will be notified and given written instructions for further action.  It is your responsibility to monitor the irradiated area for the 2 weeks following the procedure and to notify your physician if you are concerned that you have suffered a radiation induced injury.    All of the patient's questions were answered, patient is agreeable to proceed.  Consent signed and in chart.      Electronically Signed: D. Jeananne Rama, PA-C 05/08/2023, 5:43 PM   I spent a total of 25 minutes at the the patient's bedside AND on the patient's hospital floor or unit, greater than 50% of which was counseling/coordinating care for left renal/splenic arteriogram with possible embolization

## 2023-05-08 NOTE — Telephone Encounter (Signed)
Per Dr. Royann Shivers, called to arrange Watchman consult with Dr. Lalla Brothers.  Left message to call back.

## 2023-05-08 NOTE — Telephone Encounter (Signed)
Spoke with the patient at length. While she was offered earlier appointments, scheduled her with Dr. Lalla Brothers for [facility] consult 08/21/2023. She has an appointment at [facility] for [facility] consult 06/24/2023.  She has not decided who she wishes to do the procedure. She will call after her appointment 11/6 and keep or cancel the appointment with Dr. Lalla Brothers. She was grateful for call and agreed with plan.

## 2023-05-08 NOTE — Telephone Encounter (Signed)
This encounter was created in error - please disregard.

## 2023-05-11 ENCOUNTER — Other Ambulatory Visit (HOSPITAL_COMMUNITY): Payer: Self-pay | Admitting: Interventional Radiology

## 2023-05-11 ENCOUNTER — Ambulatory Visit (HOSPITAL_COMMUNITY)
Admission: RE | Admit: 2023-05-11 | Discharge: 2023-05-11 | Disposition: A | Discharge status: Home / Self Care | Payer: Medicare Other | Source: Ambulatory Visit | Attending: Interventional Radiology | Admitting: Interventional Radiology

## 2023-05-11 ENCOUNTER — Other Ambulatory Visit: Payer: Self-pay | Admitting: Radiology

## 2023-05-11 ENCOUNTER — Encounter: Payer: Self-pay | Admitting: Radiology

## 2023-05-11 ENCOUNTER — Other Ambulatory Visit: Payer: Self-pay

## 2023-05-11 ENCOUNTER — Encounter (HOSPITAL_COMMUNITY): Payer: Self-pay

## 2023-05-11 ENCOUNTER — Ambulatory Visit (HOSPITAL_COMMUNITY)
Admission: RE | Admit: 2023-05-11 | Discharge: 2023-05-11 | Disposition: A | Discharge status: Home / Self Care | Payer: Medicare Other | Source: Ambulatory Visit | Attending: Interventional Radiology

## 2023-05-11 DIAGNOSIS — D1771 Benign lipomatous neoplasm of kidney: Secondary | ICD-10-CM

## 2023-05-11 DIAGNOSIS — Z8679 Personal history of other diseases of the circulatory system: Secondary | ICD-10-CM

## 2023-05-11 DIAGNOSIS — R58 Hemorrhage, not elsewhere classified: Secondary | ICD-10-CM

## 2023-05-11 DIAGNOSIS — I480 Paroxysmal atrial fibrillation: Secondary | ICD-10-CM | POA: Diagnosis not present

## 2023-05-11 DIAGNOSIS — Z7901 Long term (current) use of anticoagulants: Secondary | ICD-10-CM | POA: Diagnosis not present

## 2023-05-11 DIAGNOSIS — M810 Age-related osteoporosis without current pathological fracture: Secondary | ICD-10-CM | POA: Insufficient documentation

## 2023-05-11 DIAGNOSIS — R319 Hematuria, unspecified: Secondary | ICD-10-CM | POA: Diagnosis not present

## 2023-05-11 HISTORY — PX: IR US GUIDE VASC ACCESS RIGHT: IMG2390

## 2023-05-11 HISTORY — PX: IR ANGIOGRAM SELECTIVE EACH ADDITIONAL VESSEL: IMG667

## 2023-05-11 HISTORY — PX: IR ANGIOGRAM VISCERAL SELECTIVE: IMG657

## 2023-05-11 HISTORY — PX: IR RENAL SUPRASEL UNI S&I MOD SED: IMG655

## 2023-05-11 HISTORY — PX: IR RENAL SELECTIVE  UNI INC S&I MOD SED: IMG654

## 2023-05-11 HISTORY — PX: IR EMBO ART  VEN HEMORR LYMPH EXTRAV  INC GUIDE ROADMAPPING: IMG5450

## 2023-05-11 LAB — BASIC METABOLIC PANEL
Anion gap: 10 (ref 5–15)
BUN: 21 mg/dL (ref 8–23)
CO2: 22 mmol/L (ref 22–32)
Calcium: 8.5 mg/dL — ABNORMAL LOW (ref 8.9–10.3)
Chloride: 107 mmol/L (ref 98–111)
Creatinine, Ser: 0.68 mg/dL (ref 0.44–1.00)
GFR, Estimated: >60 mL/min (ref >60)
Glucose, Bld: 93 mg/dL (ref 70–99)
Potassium: 3.8 mmol/L (ref 3.5–5.1)
Sodium: 139 mmol/L (ref 135–145)

## 2023-05-11 LAB — CBC WITH DIFFERENTIAL/PLATELET
Abs Immature Granulocytes: 0.01 10*3/uL (ref 0.00–0.07)
Basophils Absolute: 0 10*3/uL (ref 0.0–0.1)
Basophils Relative: 1 %
Eosinophils Absolute: 0 10*3/uL (ref 0.0–0.5)
Eosinophils Relative: 1 %
HCT: 43.4 % (ref 36.0–46.0)
Hemoglobin: 13.8 g/dL (ref 12.0–15.0)
Immature Granulocytes: 0 %
Lymphocytes Relative: 23 %
Lymphs Abs: 1.4 10*3/uL (ref 0.7–4.0)
MCH: 31.7 pg (ref 26.0–34.0)
MCHC: 31.8 g/dL (ref 30.0–36.0)
MCV: 99.8 fL (ref 80.0–100.0)
Monocytes Absolute: 0.5 10*3/uL (ref 0.1–1.0)
Monocytes Relative: 9 %
Neutro Abs: 4.1 10*3/uL (ref 1.7–7.7)
Neutrophils Relative %: 66 %
Platelets: 174 10*3/uL (ref 150–400)
RBC: 4.35 MIL/uL (ref 3.87–5.11)
RDW: 13.3 % (ref 11.5–15.5)
WBC: 6.2 10*3/uL (ref 4.0–10.5)
nRBC: 0 % (ref 0.0–0.2)

## 2023-05-11 LAB — PROTIME-INR
INR: 1 (ref 0.8–1.2)
Prothrombin Time: 13.1 seconds (ref 11.4–15.2)

## 2023-05-11 MED ORDER — CEFAZOLIN SODIUM-DEXTROSE 2-4 GM/100ML-% IV SOLN
INTRAVENOUS | Status: AC
  Filled 2023-05-11: qty 100

## 2023-05-11 MED ORDER — FENTANYL CITRATE (PF) 100 MCG/2ML IJ SOLN
INTRAMUSCULAR | Status: AC | PRN
Start: 2023-05-11 — End: 2023-05-11
  Administered 2023-05-11: 50 ug via INTRAVENOUS

## 2023-05-11 MED ORDER — IOHEXOL 300 MG/ML  SOLN
100.0000 mL | Freq: Once | INTRAMUSCULAR | Status: AC | PRN
  Administered 2023-05-11: 20 mL via INTRA_ARTERIAL

## 2023-05-11 MED ORDER — FENTANYL CITRATE (PF) 100 MCG/2ML IJ SOLN
INTRAMUSCULAR | Status: AC | PRN
  Administered 2023-05-11: 25 ug via INTRAVENOUS

## 2023-05-11 MED ORDER — MIDAZOLAM HCL 2 MG/2ML IJ SOLN
INTRAMUSCULAR | Status: AC | PRN
  Administered 2023-05-11: 1 mg via INTRAVENOUS

## 2023-05-11 MED ORDER — MIDAZOLAM HCL 2 MG/2ML IJ SOLN
INTRAMUSCULAR | Status: AC | PRN
Start: 2023-05-11 — End: 2023-05-11
  Administered 2023-05-11: .5 mg via INTRAVENOUS

## 2023-05-11 MED ORDER — IOHEXOL 300 MG/ML  SOLN
50.0000 mL | Freq: Once | INTRAMUSCULAR | Status: AC | PRN
  Administered 2023-05-11: 15 mL

## 2023-05-11 MED ORDER — IOHEXOL 300 MG/ML  SOLN
50.0000 mL | Freq: Once | INTRAMUSCULAR | Status: AC | PRN
  Administered 2023-05-11: 13 mL via INTRA_ARTERIAL

## 2023-05-11 MED ORDER — IOHEXOL 300 MG/ML  SOLN
100.0000 mL | Freq: Once | INTRAMUSCULAR | Status: AC | PRN
  Administered 2023-05-11: 60 mL via INTRA_ARTERIAL

## 2023-05-11 MED ORDER — SODIUM CHLORIDE 0.9 % IV SOLN: INTRAVENOUS | Status: DC

## 2023-05-11 MED ORDER — FENTANYL CITRATE (PF) 100 MCG/2ML IJ SOLN
INTRAMUSCULAR | Status: AC
  Filled 2023-05-11: qty 4

## 2023-05-11 MED ORDER — FENTANYL CITRATE (PF) 100 MCG/2ML IJ SOLN
INTRAMUSCULAR | Status: AC | PRN
  Administered 2023-05-11: 50 ug via INTRAVENOUS

## 2023-05-11 MED ORDER — MIDAZOLAM HCL 2 MG/2ML IJ SOLN
INTRAMUSCULAR | Status: AC | PRN
Start: 2023-05-11 — End: 2023-05-11
  Administered 2023-05-11: 1 mg via INTRAVENOUS

## 2023-05-11 MED ORDER — CEFAZOLIN SODIUM-DEXTROSE 2-4 GM/100ML-% IV SOLN
2.0000 g | INTRAVENOUS | Status: AC
  Administered 2023-05-11: 2 g via INTRAVENOUS

## 2023-05-11 MED ORDER — MIDAZOLAM HCL 2 MG/2ML IJ SOLN
INTRAMUSCULAR | Status: AC
  Filled 2023-05-11: qty 4

## 2023-05-11 MED ORDER — LIDOCAINE HCL 1 % IJ SOLN
20.0000 mL | Freq: Once | INTRAMUSCULAR | Status: AC
  Administered 2023-05-11: 3 mL via INTRADERMAL

## 2023-05-11 NOTE — Discharge Instructions (Signed)
Please call Interventional Radiology clinic [phone] with any questions or concerns.  You may remove your dressing and shower tomorrow.  After the procedure, it is common to have: Increased frequency of urination Mild pressure or discomfort in the low belly Blood in the urine and/or painful urination for 7-14 days  Pressure, pain, or the urge to have a bowel movement when urinating and/or a small amount of blood in the stool for 4-7 days Low-grade fever (temperature less than 101.5 degrees Fahrenheit or 38.6 degrees Celsius) for 4-7 days Tenderness and/or bruising around the puncture site. Bruising can take 2-3 weeks to go away completely. Please call immediately if you feel a lump that is growing or varies with your pulse  Follow these instructions at home:  Medication: Do not use Aspirin or ibuprofen products, such as Advil or Motrin, as it may increase bleeding You may resume your usual medications as ordered by your doctor If your doctor prescribed antibiotics, take them as directed. Do not stop taking them just because you feel better  Eating and drinking: Drink plenty of liquids to keep your urine pale yellow You can resume your regular diet as directed by your doctor   Activity For procedures where we entered your artery from the top of your leg (groin):  Avoid strenuous activity, such as climbing long flights of stairs, for 24 hours after your procedure  No heavy lifting (greater than 15-20 pounds or 6-9 kg) for 7 days or until the puncture site heels Do not take baths, swim, or use a hot tub until your health care provider approves. Take showers only Keep all follow-up visits as told by your doctor  Care of the procedure site Follow instructions from your health care provider about how to take care of the puncture site.  Wash your hands with soap and water before you change your bandage (dressing). If soap and water are not available, use hand sanitizer Change your  dressing as told by your health care provider Leave stitches (sutures), skin glue, or adhesive strips in place. These skin closures may need to stay in place for 2 weeks or longer. If adhesive strip edges start to loosen and curl up, you may trim the loose edges. Do not remove adhesive strips completely unless your health care provider tells you to do that Check your puncture site every day for signs of infection. Check for: More redness, swelling, or pain Warmth/heat at puncture site Pus or a bad odor  Contact a health care provider if: You have a fever You have more redness, swelling, or pain around your incision You have more fluid or blood coming from your incision site Your incision feels warm to the touch You have pus or a bad smell coming from your incision You have a rash You have nausea, or you cannot eat or drink anything without vomiting  Get help right away if: You have trouble breathing You have chest pain You have severe pain in your abdomen, and it does not get better with medicine You have leg pain or leg swelling You feel dizzy, or you faint

## 2023-05-12 ENCOUNTER — Telehealth: Payer: Self-pay | Admitting: Interventional Radiology

## 2023-05-12 ENCOUNTER — Encounter: Payer: Self-pay | Admitting: Cardiovascular Disease

## 2023-05-12 ENCOUNTER — Encounter (HOSPITAL_COMMUNITY): Payer: Self-pay | Admitting: Interventional Radiology

## 2023-05-12 NOTE — Telephone Encounter (Signed)
Spark Message from Dr. 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  Have her try some naproxen 500 mg BID and her sx should improve over the coming days.  IF she needs more, we can have an APP order her some percocet 5/325 mg one every 4 hrs for pain, dispense 15

## 2023-05-28 ENCOUNTER — Other Ambulatory Visit: Payer: Self-pay | Admitting: Interventional Radiology

## 2023-05-28 DIAGNOSIS — D1771 Benign lipomatous neoplasm of kidney: Secondary | ICD-10-CM

## 2023-06-01 ENCOUNTER — Ambulatory Visit: Payer: Medicare Other | Admitting: Interventional Cardiology

## 2023-06-08 ENCOUNTER — Encounter (HOSPITAL_COMMUNITY): Payer: Self-pay | Admitting: Radiology

## 2023-06-09 ENCOUNTER — Other Ambulatory Visit: Payer: Self-pay | Admitting: Interventional Radiology

## 2023-06-09 ENCOUNTER — Ambulatory Visit
Admission: RE | Admit: 2023-06-09 | Discharge: 2023-06-09 | Disposition: A | Discharge status: Home / Self Care | Payer: Medicare Other | Source: Ambulatory Visit | Attending: Interventional Radiology | Admitting: Interventional Radiology

## 2023-06-09 DIAGNOSIS — D1771 Benign lipomatous neoplasm of kidney: Secondary | ICD-10-CM | POA: Diagnosis not present

## 2023-06-09 HISTORY — PX: IR RADIOLOGIST EVAL & MGMT: IMG5224

## 2023-06-09 NOTE — Addendum Note (Signed)
Encounter addended by: Arby Barrette on: 06/09/2023 11:25 AM  Actions taken: Imaging Exam ended

## 2023-06-09 NOTE — Progress Notes (Signed)
Chief Complaint: Patient was consulted remotely today (TeleHealth) for left renal AML at the request of Kaid Seeberger K.    Referring Physician(s): Dr. Charlsie Merles   History of Present Illness: Ann Vang is a 77 y.o. 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  She underwent transarterial embolization via a left radial approach on 05/13/2017 and presents today for scheduled follow-up evaluation.   CT imaging dated 09/22/2019 demonstrates continued slight interval involution of the treated renal angiomyolipoma which now measures 4.9 x 3.2 x 5.2 cm.   CT imaging 10/01/21 - Post embolization changes with similar size and appearance of exophytic LEFT renal fat-containing mass, consistent with known AML.    Unfortunately, last month she developed some episodes of hematuria followed by acute onset of severe left flank pain requiring a visit to the emergency room.  Workup demonstrated a new acute bleed emanating from the left renal angiomyolipoma.  She required multiple units of transfusion and my partner, Dr. Denny Levy, performed a renal angiogram which demonstrated recannulization of the previously placed coil pack with active extravasation of multiple small vessels.  He added some additional coils and was able to achieve hemostasis.   Ann Vang notes that she had been experiencing intermittent hematuria for several months prior to this event and had seen both her primary care physician and her urologist.  She had a CT scan performed in January of this year that demonstrated no further changes of her AML and additional workup was negative for a source of hematuria.  Clearly, these were warning signs of revascularization of the AML.   We had initially planned a repeat angiogram in December, but she called and reported having developed some recurrent hematuria and therefore I scheduled her relatively urgently for a repeat left renal angiogram and embolization  which was performed on 05/11/2023.  There was some persistent branches arising from the interpolar renal arteries.  I embolized these using a commendation of particles and subsidy a liquid embolic.  At the end of this embolization procedure, I saw no further opacification of AML branches.  I did also interrogate her splenic artery and while I could not get deep into the splenic artery due to the high level of tortuosity, I saw no convincing evidence of parasitized branches.  Further, after discussing with Dr. Laverle Patter, they would be extremely unusual for any renal mass to parasitized branches from the pancreas or spleen.  This was very helpful information and I feel more confident that we may have treated her AML as completely as possible.  She is currently doing well and has only minimal trace occasional hematuria.  No blood clots or dark red blood.  Past Medical History:  Diagnosis Date   Abnormal breast finding 12/1998   left breast abnl   Arthritis    --basal joint--right hand   Carpal tunnel syndrome, right    Compression fracture of T2 vertebra (HCC) 05/10/2019   Diverticulosis 2003   mild   Lumbar compression fracture (HCC) 07/13/2012   Lumbar disc disorder 2000   bulging   Melena    Menopause    Osteoporosis    Fx L4 age 70 water skiing accident   Palpitations    Thyroid disease 10/2014   thyroid nodules--bx'd large nodule and was benign--sees Gen. Careers adviser with [facility]    Past Surgical History:  Procedure Laterality Date   AUGMENTATION MAMMAPLASTY  1983   Implants have been removed   BREAST SURGERY  2010  implants removed ruptured   CERVICAL BIOPSY  W/ LOOP ELECTRODE EXCISION  1996   CIN   IR ANGIOGRAM SELECTIVE EACH ADDITIONAL VESSEL  03/10/2023   IR ANGIOGRAM SELECTIVE EACH ADDITIONAL VESSEL  03/10/2023   IR ANGIOGRAM SELECTIVE EACH ADDITIONAL VESSEL  03/10/2023   IR ANGIOGRAM SELECTIVE EACH ADDITIONAL VESSEL  05/11/2023   IR ANGIOGRAM SELECTIVE EACH ADDITIONAL VESSEL   05/11/2023   IR ANGIOGRAM SELECTIVE EACH ADDITIONAL VESSEL  05/11/2023   IR ANGIOGRAM VISCERAL SELECTIVE  05/11/2023   IR EMBO ART  VEN HEMORR LYMPH EXTRAV  INC GUIDE ROADMAPPING  03/10/2023   IR EMBO ART  VEN HEMORR LYMPH EXTRAV  INC GUIDE ROADMAPPING  05/11/2023   IR EMBO TUMOR ORGAN ISCHEMIA INFARCT INC GUIDE ROADMAPPING  05/13/2017   IR RADIOLOGIST EVAL & MGMT  04/15/2017   IR RADIOLOGIST EVAL & MGMT  05/27/2017   IR RADIOLOGIST EVAL & MGMT  01/21/2018   IR RADIOLOGIST EVAL & MGMT  09/28/2019   IR RADIOLOGIST EVAL & MGMT  10/07/2021   IR RADIOLOGIST EVAL & MGMT  03/31/2023   IR RADIOLOGIST EVAL & MGMT  06/09/2023   IR RENAL SELECTIVE  UNI INC S&I MOD SED  03/10/2023   IR RENAL SUPRASEL UNI S&I MOD SED  05/13/2017   IR RENAL SUPRASEL UNI S&I MOD SED  05/11/2023   IR RENAL SUPRASEL UNI S&I MOD SED  05/11/2023   IR US GUIDE VASC ACCESS LEFT  05/13/2017   IR US GUIDE VASC ACCESS RIGHT  03/10/2023   IR US GUIDE VASC ACCESS RIGHT  05/11/2023   KNEE SURGERY  2012   meniscus rt knee (both)   L-2 fracture  2009   fall in aerobics   L-4 fracture   water skiing     TENOSYNOVECTOMY Right 03/11/2016   Procedure: TENOSYNOVECTOMY FLEXOR RIGHT RING FINGER;  Surgeon: Cindee Salt, MD;  Location: [facility];  Service: Orthopedics;  Laterality: Right;   thyroid nodule biopsy  10/2014   --[facility], [place]--   TONSILLECTOMY AND ADENOIDECTOMY  1964    Allergies: Patient has no known allergies.  Medications: Prior to Admission medications   Medication Sig Start Date End Date Taking? Authorizing Provider  amiodarone (PACERONE) 200 MG tablet Take 0.5 tablets (100 mg total) by mouth daily. 10/10/22   Corky Crafts, MD  Bacillus Coagulans-Inulin (PROBIOTIC) 1-250 BILLION-MG CAPS Take 1 capsule by mouth daily.    [provider]  Cholecalciferol (VITAMIN D) 2000 UNITS tablet Take 2,000 Units by mouth daily.    [provider]  ELIQUIS 5 MG TABS tablet TAKE 1 TABLET(5 MG) BY  MOUTH TWICE DAILY 04/06/23   Corky Crafts, MD  estradiol (VIVELLE-DOT) 0.0375 MG/24HR Place 1 patch onto the skin 2 (two) times a week. 04/16/21   [provider]  fluticasone (FLONASE) 50 MCG/ACT nasal spray Place 2 sprays into the nose daily. Patient taking differently: Place 2 sprays into the nose daily as needed for allergies. 07/13/12   Edwyna Perfect, MD  magnesium gluconate (MAGONATE) 500 MG tablet Take 500 mg by mouth 2 (two) times daily.    [provider]  milk thistle 175 MG tablet Take 175 mg by mouth daily.    [provider]  OVER THE COUNTER MEDICATION Take 1 tablet by mouth daily at 6 (six) AM. Medication: Vitamin K7    [provider]  progesterone (PROMETRIUM) 100 MG capsule TAKE ONE CAPSULE BY MOUTH DAILY Patient taking differently: Take 100 mg by  mouth daily. 02/23/19   Jerene Bears, MD  progesterone (PROMETRIUM) 100 MG capsule Take 100 mg by mouth daily. 03/22/23   [provider]  SUMAtriptan (IMITREX) 50 MG tablet Take 50 mg by mouth every 2 (two) hours as needed for migraine. 03/30/23   [provider]     Family History  Problem Relation Age of Onset   Osteoporosis Mother    Alzheimer's disease Mother    Hypertension Father    Heart attack Father    Osteoporosis Maternal Grandmother    Colon cancer Maternal Grandmother    Colon cancer Other     Social History   Socioeconomic History   Marital status: Married    Spouse name: Not on file   Number of children: Not on file   Years of education: Not on file   Highest education level: Not on file  Occupational History   Not on file  Tobacco Use   Smoking status: Former    Current packs/day: 0.00    Types: Cigarettes    Quit date: 12/21/1972    Years since quitting: 50.4   Smokeless tobacco: Never  Vaping Use   Vaping status: Never Used  Substance and Sexual Activity   Alcohol use: Yes    Alcohol/week: 14.0 standard drinks of alcohol    Types: 14  Glasses of wine per week   Drug use: No   Sexual activity: Yes    Partners: Male    Birth control/protection: Post-menopausal  Other Topics Concern   Not on file  Social History Narrative   Not on file   Social Determinants of Health   Financial Resource Strain: Not on file  Food Insecurity: No Food Insecurity (03/11/2023)   Hunger Vital Sign    Worried About Running Out of Food in the Last Year: Never true    Ran Out of Food in the Last Year: Never true  Transportation Needs: No Transportation Needs (03/11/2023)   PRAPARE - Administrator, Civil Service (Medical): No    Lack of Transportation (Non-Medical): No  Physical Activity: Not on file  Stress: Not on file  Social Connections: Not on file    Review of Systems  Review of Systems: A 12 point ROS discussed and pertinent positives are indicated in the HPI above.  All other systems are negative.  Advance Care Plan: The advanced care plan/surrogate decision maker was discussed at the time of visit and the patient did not wish to discuss or was not able to name a surrogate decision maker or provide an advance care plan.    Physical Exam No direct physical exam was performed (except for noted visual exam findings with Video Visits).    Vital Signs: LMP 08/18/1994 (Approximate)   Imaging: IR Radiologist Eval & Mgmt  Result Date: 06/09/2023 EXAM: ESTABLISHED PATIENT OFFICE VISIT CHIEF COMPLAINT: SEE NOTE IN EPIC HISTORY OF PRESENT ILLNESS: SEE NOTE IN EPIC REVIEW OF SYSTEMS: SEE NOTE IN EPIC PHYSICAL EXAMINATION: SEE NOTE IN EPIC ASSESSMENT AND PLAN: SEE NOTE IN EPIC Electronically Signed   By: Malachy Moan M.D.   On: 06/09/2023 09:25   IR Angiogram Visceral Selective  Result Date: May 16, 2023 INDICATION: Add on codes to be included in the original dictation. Please see below EXAM: ADDITIONAL ARTERIOGRAPHY; SELECTIVE VISCERAL ARTERIOGRAPHY; IR RENAL SUPRASEL UNILATERAL S+I MODERATE SEDATION MEDICATIONS: 2 g  Ancef. The antibiotic was administered within 1 hour of the procedure ANESTHESIA/SEDATION: Moderate (conscious) sedation was employed during this procedure. A total  of Versed 4 mg and Fentanyl 150 mcg was administered Intravenously by the radiology nurse under my supervision. Moderate Sedation Time: 116 minutes. The patient's level of consciousness and vital signs were monitored continuously by radiology nursing throughout the procedure under my direct supervision. CONTRAST:  13mL OMNIPAQUE IOHEXOL 300 MG/ML SOLN, 20mL OMNIPAQUE IOHEXOL 300 MG/ML SOLN, 60mL OMNIPAQUE IOHEXOL 300 MG/ML SOLN, 15mL OMNIPAQUE IOHEXOL 300 MG/ML SOLN, 60mL OMNIPAQUE IOHEXOL 300 MG/ML SOLN FLUOROSCOPY: Radiation Exposure Index (as provided by the fluoroscopic device): 2470 mGy Kerma COMPLICATIONS: None immediate. PROCEDURE: Informed consent was obtained from the patient following explanation of the procedure, risks, benefits and alternatives. The patient understands, agrees and consents for the procedure. All questions were addressed. A time out was performed prior to the initiation of the procedure. Maximal barrier sterile technique utilized including caps, mask, sterile gowns, sterile gloves, large sterile drape, hand hygiene, and Betadine prep. The right common femoral artery was interrogated with ultrasound and found to be widely patent. An image was obtained and stored for the medical record. Local anesthesia was attained by infiltration with 1% lidocaine. A small dermatotomy was made. Under real-time sonographic guidance, the vessel was punctured with a 21 gauge micropuncture needle. Using standard technique, the initial micro needle was exchanged over a 0.018 micro wire for a transitional 4 [place] micro sheath. The micro sheath was then exchanged over a 0.035 wire for a 5 French vascular sheath. The a Sos catheter was advanced over a Bentson wire and used to select the left renal artery. A left renal arteriogram was performed. Mild  fibromuscular dysplasia present in the mid aspect of the renal artery. Multiple small abnormal arterial branches are present perfusing the inferior aspect of the angiomyolipoma in the interpolar aspect of the kidney. Therefore, a Cook cantata 2.8 French microcatheter was advanced over a Fathom 16 wire into the superior divisional artery. Arteriography was performed. Additional confirmation of small abnormal arterial branches. The microcatheter was next advanced into an inferior segmental artery and arteriography was performed. Again, there are tiny faint abnormal arterial branches. In an effort to further localize the origin of these branches, the microcatheter was advanced into an interlobar artery in the region of the previously placed coil pack. Arteriography was performed demonstrating more of the small abnormal branches. Filling is quite faint. The microcatheter was then advanced into a more superior segmental artery. Arteriography was performed. There appears to be some or abnormal branches arising from the interlobular portion of this more superior segmental artery. Additional catheter directed angiography was performed in an effort to Foley identify the source of all the abnormal vessels. The microcatheter was brought into a more inferior segmental artery. No evidence of abnormal vessels arising from this segment. The microcatheter was used to select the inferior divisional artery. Arteriography was again performed. No evidence of abnormal filling. No intraparenchymal cross-filling into the abnormal branches. The microcatheter was brought back into the first interlobar artery that was previously identified to supply the abnormal arteries. Particle embolization was performed using 100-300 micron embospheres. Post embolization demonstrates decreased filling of the abnormal branches. The microcatheter was then advanced into the more superior abnormal segmental artery. Again, particle embolization was performed  utilizing 100-300 micron embospheres. Further embolization of the segmental and interlobar branch was performed using Obsidio liquid embolic. The microcatheter was removed. Additional arteriography was performed through the 5 French catheter. No further abnormal filling of angiomyolipoma branches. Normal enhancement of the renal parenchyma. The 5 French catheter was next used to select the  celiac artery. A celiac arteriogram was performed. [facility] microcatheter was then advanced out into the mid splenic artery. Additional arteriography was performed utilizing power injection and digital subtraction angiography. Normal enhancement of the splenic parenchyma and filling of the splenic vein. Additional branches are identified filling the pancreatic parenchyma. No definite filling of the renal angiomyolipoma. Additional efforts were made to attempt to select additional distal branches of the splenic artery. However, this was not successful given the high level of tortuosity of the system. The catheters were removed. Hemostasis was attained with the assistance of the Celt arterial closure device. IMPRESSION: 1. Super selective left renal arteriography targeting numerous segmental and interlobar branches with particle and liquid embolization of 2 interlobar branches providing supply to the underlying angiomyolipoma. 2. Selective catheterization of the celiac axis and splenic artery with arteriography. Electronically Signed   By: Malachy Moan M.D.   On: 2023/06/03 11:24   IR Angiogram Selective Each Additional Vessel  Result Date: June 03, 2023 INDICATION: Add on codes to be included in the original dictation. Please see below EXAM: ADDITIONAL ARTERIOGRAPHY; SELECTIVE VISCERAL ARTERIOGRAPHY; IR RENAL SUPRASEL UNILATERAL S+I MODERATE SEDATION MEDICATIONS: 2 g Ancef. The antibiotic was administered within 1 hour of the procedure ANESTHESIA/SEDATION: Moderate (conscious) sedation was employed during this procedure. A  total of Versed 4 mg and Fentanyl 150 mcg was administered Intravenously by the radiology nurse under my supervision. Moderate Sedation Time: 116 minutes. The patient's level of consciousness and vital signs were monitored continuously by radiology nursing throughout the procedure under my direct supervision. CONTRAST:  13mL OMNIPAQUE IOHEXOL 300 MG/ML SOLN, 20mL OMNIPAQUE IOHEXOL 300 MG/ML SOLN, 60mL OMNIPAQUE IOHEXOL 300 MG/ML SOLN, 15mL OMNIPAQUE IOHEXOL 300 MG/ML SOLN, 60mL OMNIPAQUE IOHEXOL 300 MG/ML SOLN FLUOROSCOPY: Radiation Exposure Index (as provided by the fluoroscopic device): 2470 mGy Kerma COMPLICATIONS: None immediate. PROCEDURE: Informed consent was obtained from the patient following explanation of the procedure, risks, benefits and alternatives. The patient understands, agrees and consents for the procedure. All questions were addressed. A time out was performed prior to the initiation of the procedure. Maximal barrier sterile technique utilized including caps, mask, sterile gowns, sterile gloves, large sterile drape, hand hygiene, and Betadine prep. The right common femoral artery was interrogated with ultrasound and found to be widely patent. An image was obtained and stored for the medical record. Local anesthesia was attained by infiltration with 1% lidocaine. A small dermatotomy was made. Under real-time sonographic guidance, the vessel was punctured with a 21 gauge micropuncture needle. Using standard technique, the initial micro needle was exchanged over a 0.018 micro wire for a transitional 4 [place] micro sheath. The micro sheath was then exchanged over a 0.035 wire for a 5 French vascular sheath. The a Sos catheter was advanced over a Bentson wire and used to select the left renal artery. A left renal arteriogram was performed. Mild fibromuscular dysplasia present in the mid aspect of the renal artery. Multiple small abnormal arterial branches are present perfusing the inferior aspect of  the angiomyolipoma in the interpolar aspect of the kidney. Therefore, a Cook cantata 2.8 French microcatheter was advanced over a Fathom 16 wire into the superior divisional artery. Arteriography was performed. Additional confirmation of small abnormal arterial branches. The microcatheter was next advanced into an inferior segmental artery and arteriography was performed. Again, there are tiny faint abnormal arterial branches. In an effort to further localize the origin of these branches, the microcatheter was advanced into an interlobar artery in the region of the previously placed  coil pack. Arteriography was performed demonstrating more of the small abnormal branches. Filling is quite faint. The microcatheter was then advanced into a more superior segmental artery. Arteriography was performed. There appears to be some or abnormal branches arising from the interlobular portion of this more superior segmental artery. Additional catheter directed angiography was performed in an effort to Foley identify the source of all the abnormal vessels. The microcatheter was brought into a more inferior segmental artery. No evidence of abnormal vessels arising from this segment. The microcatheter was used to select the inferior divisional artery. Arteriography was again performed. No evidence of abnormal filling. No intraparenchymal cross-filling into the abnormal branches. The microcatheter was brought back into the first interlobar artery that was previously identified to supply the abnormal arteries. Particle embolization was performed using 100-300 micron embospheres. Post embolization demonstrates decreased filling of the abnormal branches. The microcatheter was then advanced into the more superior abnormal segmental artery. Again, particle embolization was performed utilizing 100-300 micron embospheres. Further embolization of the segmental and interlobar branch was performed using Obsidio liquid embolic. The  microcatheter was removed. Additional arteriography was performed through the 5 French catheter. No further abnormal filling of angiomyolipoma branches. Normal enhancement of the renal parenchyma. The 5 French catheter was next used to select the celiac artery. A celiac arteriogram was performed. The [facility] microcatheter was then advanced out into the mid splenic artery. Additional arteriography was performed utilizing power injection and digital subtraction angiography. Normal enhancement of the splenic parenchyma and filling of the splenic vein. Additional branches are identified filling the pancreatic parenchyma. No definite filling of the renal angiomyolipoma. Additional efforts were made to attempt to select additional distal branches of the splenic artery. However, this was not successful given the high level of tortuosity of the system. The catheters were removed. Hemostasis was attained with the assistance of the Celt arterial closure device. IMPRESSION: 1. Super selective left renal arteriography targeting numerous segmental and interlobar branches with particle and liquid embolization of 2 interlobar branches providing supply to the underlying angiomyolipoma. 2. Selective catheterization of the celiac axis and splenic artery with arteriography. Electronically Signed   By: Malachy Moan M.D.   On: 07-Jun-2023 11:24   IR Angiogram Selective Each Additional Vessel  Result Date: 2023-06-07 INDICATION: Add on codes to be included in the original dictation. Please see below EXAM: ADDITIONAL ARTERIOGRAPHY; SELECTIVE VISCERAL ARTERIOGRAPHY; IR RENAL SUPRASEL UNILATERAL S+I MODERATE SEDATION MEDICATIONS: 2 g Ancef. The antibiotic was administered within 1 hour of the procedure ANESTHESIA/SEDATION: Moderate (conscious) sedation was employed during this procedure. A total of Versed 4 mg and Fentanyl 150 mcg was administered Intravenously by the radiology nurse under my supervision. Moderate Sedation Time: 116  minutes. The patient's level of consciousness and vital signs were monitored continuously by radiology nursing throughout the procedure under my direct supervision. CONTRAST:  13mL OMNIPAQUE IOHEXOL 300 MG/ML SOLN, 20mL OMNIPAQUE IOHEXOL 300 MG/ML SOLN, 60mL OMNIPAQUE IOHEXOL 300 MG/ML SOLN, 15mL OMNIPAQUE IOHEXOL 300 MG/ML SOLN, 60mL OMNIPAQUE IOHEXOL 300 MG/ML SOLN FLUOROSCOPY: Radiation Exposure Index (as provided by the fluoroscopic device): 2470 mGy Kerma COMPLICATIONS: None immediate. PROCEDURE: Informed consent was obtained from the patient following explanation of the procedure, risks, benefits and alternatives. The patient understands, agrees and consents for the procedure. All questions were addressed. A time out was performed prior to the initiation of the procedure. Maximal barrier sterile technique utilized including caps, mask, sterile gowns, sterile gloves, large sterile drape, hand hygiene, and Betadine prep. The right common femoral  artery was interrogated with ultrasound and found to be widely patent. An image was obtained and stored for the medical record. Local anesthesia was attained by infiltration with 1% lidocaine. A small dermatotomy was made. Under real-time sonographic guidance, the vessel was punctured with a 21 gauge micropuncture needle. Using standard technique, the initial micro needle was exchanged over a 0.018 micro wire for a transitional 4 [place] micro sheath. The micro sheath was then exchanged over a 0.035 wire for a 5 French vascular sheath. The a Sos catheter was advanced over a Bentson wire and used to select the left renal artery. A left renal arteriogram was performed. Mild fibromuscular dysplasia present in the mid aspect of the renal artery. Multiple small abnormal arterial branches are present perfusing the inferior aspect of the angiomyolipoma in the interpolar aspect of the kidney. Therefore, a Cook cantata 2.8 French microcatheter was advanced over a Fathom 16 wire  into the superior divisional artery. Arteriography was performed. Additional confirmation of small abnormal arterial branches. The microcatheter was next advanced into an inferior segmental artery and arteriography was performed. Again, there are tiny faint abnormal arterial branches. In an effort to further localize the origin of these branches, the microcatheter was advanced into an interlobar artery in the region of the previously placed coil pack. Arteriography was performed demonstrating more of the small abnormal branches. Filling is quite faint. The microcatheter was then advanced into a more superior segmental artery. Arteriography was performed. There appears to be some or abnormal branches arising from the interlobular portion of this more superior segmental artery. Additional catheter directed angiography was performed in an effort to Foley identify the source of all the abnormal vessels. The microcatheter was brought into a more inferior segmental artery. No evidence of abnormal vessels arising from this segment. The microcatheter was used to select the inferior divisional artery. Arteriography was again performed. No evidence of abnormal filling. No intraparenchymal cross-filling into the abnormal branches. The microcatheter was brought back into the first interlobar artery that was previously identified to supply the abnormal arteries. Particle embolization was performed using 100-300 micron embospheres. Post embolization demonstrates decreased filling of the abnormal branches. The microcatheter was then advanced into the more superior abnormal segmental artery. Again, particle embolization was performed utilizing 100-300 micron embospheres. Further embolization of the segmental and interlobar branch was performed using Obsidio liquid embolic. The microcatheter was removed. Additional arteriography was performed through the 5 French catheter. No further abnormal filling of angiomyolipoma branches.  Normal enhancement of the renal parenchyma. The 5 French catheter was next used to select the celiac artery. A celiac arteriogram was performed. The [facility] microcatheter was then advanced out into the mid splenic artery. Additional arteriography was performed utilizing power injection and digital subtraction angiography. Normal enhancement of the splenic parenchyma and filling of the splenic vein. Additional branches are identified filling the pancreatic parenchyma. No definite filling of the renal angiomyolipoma. Additional efforts were made to attempt to select additional distal branches of the splenic artery. However, this was not successful given the high level of tortuosity of the system. The catheters were removed. Hemostasis was attained with the assistance of the Celt arterial closure device. IMPRESSION: 1. Super selective left renal arteriography targeting numerous segmental and interlobar branches with particle and liquid embolization of 2 interlobar branches providing supply to the underlying angiomyolipoma. 2. Selective catheterization of the celiac axis and splenic artery with arteriography. Electronically Signed   By: Malachy Moan M.D.   On: 05/12/2023 11:24  IR Angiogram Selective Each Additional Vessel  Result Date: 05/21/2023 INDICATION: Add on codes to be included in the original dictation. Please see below EXAM: ADDITIONAL ARTERIOGRAPHY; SELECTIVE VISCERAL ARTERIOGRAPHY; IR RENAL SUPRASEL UNILATERAL S+I MODERATE SEDATION MEDICATIONS: 2 g Ancef. The antibiotic was administered within 1 hour of the procedure ANESTHESIA/SEDATION: Moderate (conscious) sedation was employed during this procedure. A total of Versed 4 mg and Fentanyl 150 mcg was administered Intravenously by the radiology nurse under my supervision. Moderate Sedation Time: 116 minutes. The patient's level of consciousness and vital signs were monitored continuously by radiology nursing throughout the procedure under my direct  supervision. CONTRAST:  13mL OMNIPAQUE IOHEXOL 300 MG/ML SOLN, 20mL OMNIPAQUE IOHEXOL 300 MG/ML SOLN, 60mL OMNIPAQUE IOHEXOL 300 MG/ML SOLN, 15mL OMNIPAQUE IOHEXOL 300 MG/ML SOLN, 60mL OMNIPAQUE IOHEXOL 300 MG/ML SOLN FLUOROSCOPY: Radiation Exposure Index (as provided by the fluoroscopic device): 2470 mGy Kerma COMPLICATIONS: None immediate. PROCEDURE: Informed consent was obtained from the patient following explanation of the procedure, risks, benefits and alternatives. The patient understands, agrees and consents for the procedure. All questions were addressed. A time out was performed prior to the initiation of the procedure. Maximal barrier sterile technique utilized including caps, mask, sterile gowns, sterile gloves, large sterile drape, hand hygiene, and Betadine prep. The right common femoral artery was interrogated with ultrasound and found to be widely patent. An image was obtained and stored for the medical record. Local anesthesia was attained by infiltration with 1% lidocaine. A small dermatotomy was made. Under real-time sonographic guidance, the vessel was punctured with a 21 gauge micropuncture needle. Using standard technique, the initial micro needle was exchanged over a 0.018 micro wire for a transitional 4 [place] micro sheath. The micro sheath was then exchanged over a 0.035 wire for a 5 French vascular sheath. The a Sos catheter was advanced over a Bentson wire and used to select the left renal artery. A left renal arteriogram was performed. Mild fibromuscular dysplasia present in the mid aspect of the renal artery. Multiple small abnormal arterial branches are present perfusing the inferior aspect of the angiomyolipoma in the interpolar aspect of the kidney. Therefore, a Cook cantata 2.8 French microcatheter was advanced over a Fathom 16 wire into the superior divisional artery. Arteriography was performed. Additional confirmation of small abnormal arterial branches. The microcatheter was next  advanced into an inferior segmental artery and arteriography was performed. Again, there are tiny faint abnormal arterial branches. In an effort to further localize the origin of these branches, the microcatheter was advanced into an interlobar artery in the region of the previously placed coil pack. Arteriography was performed demonstrating more of the small abnormal branches. Filling is quite faint. The microcatheter was then advanced into a more superior segmental artery. Arteriography was performed. There appears to be some or abnormal branches arising from the interlobular portion of this more superior segmental artery. Additional catheter directed angiography was performed in an effort to Foley identify the source of all the abnormal vessels. The microcatheter was brought into a more inferior segmental artery. No evidence of abnormal vessels arising from this segment. The microcatheter was used to select the inferior divisional artery. Arteriography was again performed. No evidence of abnormal filling. No intraparenchymal cross-filling into the abnormal branches. The microcatheter was brought back into the first interlobar artery that was previously identified to supply the abnormal arteries. Particle embolization was performed using 100-300 micron embospheres. Post embolization demonstrates decreased filling of the abnormal branches. The microcatheter was then advanced into the more superior  abnormal segmental artery. Again, particle embolization was performed utilizing 100-300 micron embospheres. Further embolization of the segmental and interlobar branch was performed using Obsidio liquid embolic. The microcatheter was removed. Additional arteriography was performed through the 5 French catheter. No further abnormal filling of angiomyolipoma branches. Normal enhancement of the renal parenchyma. The 5 French catheter was next used to select the celiac artery. A celiac arteriogram was performed. The [facility]  microcatheter was then advanced out into the mid splenic artery. Additional arteriography was performed utilizing power injection and digital subtraction angiography. Normal enhancement of the splenic parenchyma and filling of the splenic vein. Additional branches are identified filling the pancreatic parenchyma. No definite filling of the renal angiomyolipoma. Additional efforts were made to attempt to select additional distal branches of the splenic artery. However, this was not successful given the high level of tortuosity of the system. The catheters were removed. Hemostasis was attained with the assistance of the Celt arterial closure device. IMPRESSION: 1. Super selective left renal arteriography targeting numerous segmental and interlobar branches with particle and liquid embolization of 2 interlobar branches providing supply to the underlying angiomyolipoma. 2. Selective catheterization of the celiac axis and splenic artery with arteriography. Electronically Signed   By: Malachy Moan M.D.   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 She is having recurrent episodes of hematuria concerning for another impending hemorrhage. She presents for angiogram and possible embolization. EXAM: IR EMBO ART VEN HEMORR LYMPH EXTRAV INC GUIDE ROADMAPPING; IR ULTRASOUND GUIDANCE VASC ACCESS RIGHT; IR RENAL SUPRASEL UNILATERAL S+I MODERATE SEDATION; IR RENAL SELECTIVE UNI INC S+I MODERATE SEDATION MEDICATIONS: 2 g Ancef. The antibiotic was administered within 1 hour of the procedure ANESTHESIA/SEDATION: Moderate (conscious) sedation was employed during this procedure. A total of Versed 4 mg and Fentanyl 150 mcg was administered intravenously. Moderate Sedation Time: 116  minutes. The patient's level of consciousness and vital signs were monitored continuously by radiology nursing throughout the procedure under my direct supervision. CONTRAST:  13 mL OMNIPAQUE IOHEXOL 300 MG/ML SOLN, 20mL OMNIPAQUE IOHEXOL 300 MG/ML SOLN, 60mL OMNIPAQUE IOHEXOL 300 MG/ML SOLN, 15mL OMNIPAQUE IOHEXOL 300 MG/ML SOLN, 60mL OMNIPAQUE IOHEXOL 300 MG/ML SOLN FLUOROSCOPY: Radiation Exposure Index (as provided by the fluoroscopic device): 2,470 mGy Kerma COMPLICATIONS: None immediate. PROCEDURE: Informed consent was obtained from the patient following explanation of the procedure, risks, benefits and alternatives. The patient understands, agrees and consents for the procedure. All questions were addressed. A time out was performed prior to the initiation of the procedure. Maximal barrier sterile technique utilized including caps, mask, sterile gowns, sterile gloves, large sterile drape, hand hygiene, and Betadine prep. The right common femoral artery was interrogated with ultrasound and found to be widely patent. An image was obtained and stored for the medical record. Local anesthesia was attained by infiltration with 1% lidocaine. A small dermatotomy was made. Under real-time sonographic guidance, the vessel was punctured with a 21 gauge micropuncture needle. Using standard technique, the initial micro needle was exchanged over a 0.018 micro wire for a transitional 4 [place] micro sheath. The micro sheath was then exchanged over a 0.035 wire for a 5 French vascular sheath. The a Sos catheter was advanced over a Bentson wire and used to select the left renal artery. A left renal arteriogram was performed.  Mild fibromuscular dysplasia present in the mid aspect of the renal artery. Multiple small abnormal arterial branches are present perfusing the inferior aspect of the angiomyolipoma in the interpolar aspect of the kidney. Therefore, a Cook cantata 2.8 French microcatheter was advanced over a Fathom 16 wire  into the superior divisional artery. Arteriography was performed. Additional confirmation of small abnormal arterial branches. The microcatheter was next advanced into an inferior segmental artery and arteriography was performed. Again, there are tiny faint abnormal arterial branches. In an effort to further localize the origin of these branches, the microcatheter was advanced into an interlobar artery in the region of the previously placed coil pack. Arteriography was performed demonstrating more of the small abnormal branches. Filling is quite faint. The microcatheter was then advanced into a more superior segmental artery. Arteriography was performed. There appears to be some or abnormal branches arising from the interlobular portion of this more superior segmental artery. Additional catheter directed angiography was performed in an effort to Foley identify the source of all the abnormal vessels. The microcatheter was brought into a more inferior segmental artery. No evidence of abnormal vessels arising from this segment. The microcatheter was used to select the inferior divisional artery. Arteriography was again performed. No evidence of abnormal filling. No intraparenchymal cross-filling into the abnormal branches. The microcatheter was brought back into the first interlobar artery that was previously identified to supply the abnormal arteries. Particle embolization was performed using 100-300 micron embospheres. Post embolization demonstrates decreased filling of the abnormal branches. The microcatheter was then advanced into the more superior abnormal segmental artery. Again, particle embolization was performed utilizing 100-300 micron embospheres. Further embolization of the segmental and interlobar branch was performed using Obsidio liquid embolic. The microcatheter was removed. Additional arteriography was performed through the 5 French catheter. No further abnormal filling of angiomyolipoma branches.  Normal enhancement of the renal parenchyma. The 5 French catheter was next used to select the celiac artery. A celiac arteriogram was performed. The [facility] microcatheter was then advanced out into the mid splenic artery. Additional arteriography was performed utilizing power injection and digital subtraction angiography. Normal enhancement of the splenic parenchyma and filling of the splenic vein. Additional branches are identified filling the pancreatic parenchyma. No definite filling of the renal angiomyolipoma. Additional efforts were made to attempt to select additional distal branches of the splenic artery. However, this was not successful given the high level of tortuosity of the system. The catheters were removed. Hemostasis was attained with the assistance of the Celt arterial closure device. IMPRESSION: 1. Super selective left renal arteriography targeting numerous segmental and interlobar branches with particle and liquid embolization of 2 interlobar branches providing supply to the underlying angiomyolipoma. 2. Selective catheterization of the celiac axis and splenic artery with arteriography. Signed, Sterling Big, MD, RPVI [facility] Radiology Electronically Signed   By: Malachy Moan M.D.   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 She is having recurrent episodes of hematuria concerning for another impending hemorrhage. She presents for angiogram and possible embolization. EXAM: IR EMBO ART VEN HEMORR LYMPH EXTRAV INC GUIDE ROADMAPPING; IR ULTRASOUND GUIDANCE VASC ACCESS RIGHT; IR RENAL SUPRASEL UNILATERAL S+I MODERATE SEDATION; IR RENAL SELECTIVE UNI INC S+I MODERATE SEDATION MEDICATIONS: 2 g Ancef. The antibiotic was  administered within 1 hour  of the procedure ANESTHESIA/SEDATION: Moderate (conscious) sedation was employed during this procedure. A total of Versed 4 mg and Fentanyl 150 mcg was administered intravenously. Moderate Sedation Time: 116 minutes. The patient's level of consciousness and vital signs were monitored continuously by radiology nursing throughout the procedure under my direct supervision. CONTRAST:  13 mL OMNIPAQUE IOHEXOL 300 MG/ML SOLN, 20mL OMNIPAQUE IOHEXOL 300 MG/ML SOLN, 60mL OMNIPAQUE IOHEXOL 300 MG/ML SOLN, 15mL OMNIPAQUE IOHEXOL 300 MG/ML SOLN, 60mL OMNIPAQUE IOHEXOL 300 MG/ML SOLN FLUOROSCOPY: Radiation Exposure Index (as provided by the fluoroscopic device): 2,470 mGy Kerma COMPLICATIONS: None immediate. PROCEDURE: Informed consent was obtained from the patient following explanation of the procedure, risks, benefits and alternatives. The patient understands, agrees and consents for the procedure. All questions were addressed. A time out was performed prior to the initiation of the procedure. Maximal barrier sterile technique utilized including caps, mask, sterile gowns, sterile gloves, large sterile drape, hand hygiene, and Betadine prep. The right common femoral artery was interrogated with ultrasound and found to be widely patent. An image was obtained and stored for the medical record. Local anesthesia was attained by infiltration with 1% lidocaine. A small dermatotomy was made. Under real-time sonographic guidance, the vessel was punctured with a 21 gauge micropuncture needle. Using standard technique, the initial micro needle was exchanged over a 0.018 micro wire for a transitional 4 [place] micro sheath. The micro sheath was then exchanged over a 0.035 wire for a 5 French vascular sheath. The a Sos catheter was advanced over a Bentson wire and used to select the left renal artery. A left renal arteriogram was performed. Mild fibromuscular dysplasia present in the mid aspect of the renal  artery. Multiple small abnormal arterial branches are present perfusing the inferior aspect of the angiomyolipoma in the interpolar aspect of the kidney. Therefore, a Cook cantata 2.8 French microcatheter was advanced over a Fathom 16 wire into the superior divisional artery. Arteriography was performed. Additional confirmation of small abnormal arterial branches. The microcatheter was next advanced into an inferior segmental artery and arteriography was performed. Again, there are tiny faint abnormal arterial branches. In an effort to further localize the origin of these branches, the microcatheter was advanced into an interlobar artery in the region of the previously placed coil pack. Arteriography was performed demonstrating more of the small abnormal branches. Filling is quite faint. The microcatheter was then advanced into a more superior segmental artery. Arteriography was performed. There appears to be some or abnormal branches arising from the interlobular portion of this more superior segmental artery. Additional catheter directed angiography was performed in an effort to Foley identify the source of all the abnormal vessels. The microcatheter was brought into a more inferior segmental artery. No evidence of abnormal vessels arising from this segment. The microcatheter was used to select the inferior divisional artery. Arteriography was again performed. No evidence of abnormal filling. No intraparenchymal cross-filling into the abnormal branches. The microcatheter was brought back into the first interlobar artery that was previously identified to supply the abnormal arteries. Particle embolization was performed using 100-300 micron embospheres. Post embolization demonstrates decreased filling of the abnormal branches. The microcatheter was then advanced into the more superior abnormal segmental artery. Again, particle embolization was performed utilizing 100-300 micron embospheres. Further embolization of  the segmental and interlobar branch was performed using Obsidio liquid embolic. The microcatheter was removed. Additional arteriography was performed through the 5 French catheter. No further abnormal filling of angiomyolipoma branches. Normal enhancement of the renal parenchyma. The 5  French catheter was next used to select the celiac artery. A celiac arteriogram was performed. The [facility] microcatheter was then advanced out into the mid splenic artery. Additional arteriography was performed utilizing power injection and digital subtraction angiography. Normal enhancement of the splenic parenchyma and filling of the splenic vein. Additional branches are identified filling the pancreatic parenchyma. No definite filling of the renal angiomyolipoma. Additional efforts were made to attempt to select additional distal branches of the splenic artery. However, this was not successful given the high level of tortuosity of the system. The catheters were removed. Hemostasis was attained with the assistance of the Celt arterial closure device. IMPRESSION: 1. Super selective left renal arteriography targeting numerous segmental and interlobar branches with particle and liquid embolization of 2 interlobar branches providing supply to the underlying angiomyolipoma. 2. Selective catheterization of the celiac axis and splenic artery with arteriography. Signed, Sterling Big, MD, RPVI [facility] Radiology Electronically Signed   By: Malachy Moan M.D.   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 She is having recurrent episodes of hematuria concerning for another impending hemorrhage. She presents for angiogram and possible embolization. EXAM: IR EMBO ART  VEN HEMORR LYMPH EXTRAV INC GUIDE ROADMAPPING; IR ULTRASOUND GUIDANCE VASC ACCESS RIGHT; IR RENAL SUPRASEL UNILATERAL S+I MODERATE SEDATION; IR RENAL SELECTIVE UNI INC S+I MODERATE SEDATION MEDICATIONS: 2 g Ancef. The antibiotic was administered within 1 hour of the procedure ANESTHESIA/SEDATION: Moderate (conscious) sedation was employed during this procedure. A total of Versed 4 mg and Fentanyl 150 mcg was administered intravenously. Moderate Sedation Time: 116 minutes. The patient's level of consciousness and vital signs were monitored continuously by radiology nursing throughout the procedure under my direct supervision. CONTRAST:  13 mL OMNIPAQUE IOHEXOL 300 MG/ML SOLN, 20mL OMNIPAQUE IOHEXOL 300 MG/ML SOLN, 60mL OMNIPAQUE IOHEXOL 300 MG/ML SOLN, 15mL OMNIPAQUE IOHEXOL 300 MG/ML SOLN, 60mL OMNIPAQUE IOHEXOL 300 MG/ML SOLN FLUOROSCOPY: Radiation Exposure Index (as provided by the fluoroscopic device): 2,470 mGy Kerma COMPLICATIONS: None immediate. PROCEDURE: Informed consent was obtained from the patient following explanation of the procedure, risks, benefits and alternatives. The patient understands, agrees and consents for the procedure. All questions were addressed. A time out was performed prior to the initiation of the procedure. Maximal barrier sterile technique utilized including caps, mask, sterile gowns, sterile gloves, large sterile drape, hand hygiene, and Betadine prep. The right common femoral artery was interrogated with ultrasound and found to be widely patent. An image was obtained and stored for the medical record. Local anesthesia was attained by infiltration with 1% lidocaine. A small dermatotomy was made. Under real-time sonographic guidance, the vessel was punctured with a 21 gauge micropuncture needle. Using standard technique, the initial micro needle was exchanged over a 0.018 micro wire for a transitional 4 [place] micro sheath. The micro sheath was then exchanged over a 0.035 wire for a  5 French vascular sheath. The a Sos catheter was advanced over a Bentson wire and used to select the left renal artery. A left renal arteriogram was performed. Mild fibromuscular dysplasia present in the mid aspect of the renal artery. Multiple small abnormal arterial branches are present perfusing the inferior aspect of the angiomyolipoma in the interpolar aspect of the kidney. Therefore, a Cook cantata 2.8 French microcatheter was advanced over a Fathom 16 wire into  the superior divisional artery. Arteriography was performed. Additional confirmation of small abnormal arterial branches. The microcatheter was next advanced into an inferior segmental artery and arteriography was performed. Again, there are tiny faint abnormal arterial branches. In an effort to further localize the origin of these branches, the microcatheter was advanced into an interlobar artery in the region of the previously placed coil pack. Arteriography was performed demonstrating more of the small abnormal branches. Filling is quite faint. The microcatheter was then advanced into a more superior segmental artery. Arteriography was performed. There appears to be some or abnormal branches arising from the interlobular portion of this more superior segmental artery. Additional catheter directed angiography was performed in an effort to Foley identify the source of all the abnormal vessels. The microcatheter was brought into a more inferior segmental artery. No evidence of abnormal vessels arising from this segment. The microcatheter was used to select the inferior divisional artery. Arteriography was again performed. No evidence of abnormal filling. No intraparenchymal cross-filling into the abnormal branches. The microcatheter was brought back into the first interlobar artery that was previously identified to supply the abnormal arteries. Particle embolization was performed using 100-300 micron embospheres. Post embolization demonstrates  decreased filling of the abnormal branches. The microcatheter was then advanced into the more superior abnormal segmental artery. Again, particle embolization was performed utilizing 100-300 micron embospheres. Further embolization of the segmental and interlobar branch was performed using Obsidio liquid embolic. The microcatheter was removed. Additional arteriography was performed through the 5 French catheter. No further abnormal filling of angiomyolipoma branches. Normal enhancement of the renal parenchyma. The 5 French catheter was next used to select the celiac artery. A celiac arteriogram was performed. The [facility] microcatheter was then advanced out into the mid splenic artery. Additional arteriography was performed utilizing power injection and digital subtraction angiography. Normal enhancement of the splenic parenchyma and filling of the splenic vein. Additional branches are identified filling the pancreatic parenchyma. No definite filling of the renal angiomyolipoma. Additional efforts were made to attempt to select additional distal branches of the splenic artery. However, this was not successful given the high level of tortuosity of the system. The catheters were removed. Hemostasis was attained with the assistance of the Celt arterial closure device. IMPRESSION: 1. Super selective left renal arteriography targeting numerous segmental and interlobar branches with particle and liquid embolization of 2 interlobar branches providing supply to the underlying angiomyolipoma. 2. Selective catheterization of the celiac axis and splenic artery with arteriography. Signed, Sterling Big, MD, RPVI [facility] Electronically Signed   By: Malachy Moan M.D.   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 She is having recurrent episodes of hematuria concerning for another impending hemorrhage. She presents for angiogram and possible embolization. EXAM: IR EMBO ART VEN HEMORR LYMPH EXTRAV INC GUIDE ROADMAPPING; IR ULTRASOUND GUIDANCE VASC ACCESS RIGHT; IR RENAL SUPRASEL UNILATERAL S+I MODERATE SEDATION; IR RENAL SELECTIVE UNI INC S+I MODERATE SEDATION MEDICATIONS: 2 g Ancef. The antibiotic was administered within 1 hour of the procedure ANESTHESIA/SEDATION: Moderate (conscious) sedation was employed during this procedure. A total of Versed 4 mg and Fentanyl 150 mcg was administered intravenously. Moderate Sedation Time: 116 minutes. The patient's level of consciousness and vital signs were monitored continuously by radiology nursing throughout the procedure under my direct  supervision. CONTRAST:  13 mL OMNIPAQUE IOHEXOL 300 MG/ML SOLN, 20mL OMNIPAQUE IOHEXOL 300 MG/ML SOLN, 60mL OMNIPAQUE IOHEXOL 300 MG/ML SOLN, 15mL OMNIPAQUE IOHEXOL 300 MG/ML SOLN, 60mL OMNIPAQUE IOHEXOL 300 MG/ML SOLN FLUOROSCOPY: Radiation Exposure Index (as provided by the fluoroscopic device): 2,470 mGy Kerma COMPLICATIONS: None immediate. PROCEDURE: Informed consent was obtained from the patient following explanation of the procedure, risks, benefits and alternatives. The patient understands, agrees and consents for the procedure. All questions were addressed. A time out was performed prior to the initiation of the procedure. Maximal barrier sterile technique utilized including caps, mask, sterile gowns, sterile gloves, large sterile drape, hand hygiene, and Betadine prep. The right common femoral artery was interrogated with ultrasound and found to be widely patent. An image was obtained and stored for the medical record. Local anesthesia was attained by infiltration with 1% lidocaine. A small dermatotomy was made. Under real-time sonographic guidance, the vessel was  punctured with a 21 gauge micropuncture needle. Using standard technique, the initial micro needle was exchanged over a 0.018 micro wire for a transitional 4 [place] micro sheath. The micro sheath was then exchanged over a 0.035 wire for a 5 French vascular sheath. The a Sos catheter was advanced over a Bentson wire and used to select the left renal artery. A left renal arteriogram was performed. Mild fibromuscular dysplasia present in the mid aspect of the renal artery. Multiple small abnormal arterial branches are present perfusing the inferior aspect of the angiomyolipoma in the interpolar aspect of the kidney. Therefore, a Cook cantata 2.8 French microcatheter was advanced over a Fathom 16 wire into the superior divisional artery. Arteriography was performed. Additional confirmation of small abnormal arterial branches. The microcatheter was next advanced into an inferior segmental artery and arteriography was performed. Again, there are tiny faint abnormal arterial branches. In an effort to further localize the origin of these branches, the microcatheter was advanced into an interlobar artery in the region of the previously placed coil pack. Arteriography was performed demonstrating more of the small abnormal branches. Filling is quite faint. The microcatheter was then advanced into a more superior segmental artery. Arteriography was performed. There appears to be some or abnormal branches arising from the interlobular portion of this more superior segmental artery. Additional catheter directed angiography was performed in an effort to Foley identify the source of all the abnormal vessels. The microcatheter was brought into a more inferior segmental artery. No evidence of abnormal vessels arising from this segment. The microcatheter was used to select the inferior divisional artery. Arteriography was again performed. No evidence of abnormal filling. No intraparenchymal cross-filling into the abnormal branches.  The microcatheter was brought back into the first interlobar artery that was previously identified to supply the abnormal arteries. Particle embolization was performed using 100-300 micron embospheres. Post embolization demonstrates decreased filling of the abnormal branches. The microcatheter was then advanced into the more superior abnormal segmental artery. Again, particle embolization was performed utilizing 100-300 micron embospheres. Further embolization of the segmental and interlobar branch was performed using Obsidio liquid embolic. The microcatheter was removed. Additional arteriography was performed through the 5 French catheter. No further abnormal filling of angiomyolipoma branches. Normal enhancement of the renal parenchyma. The 5 French catheter was next used to select the celiac artery. A celiac arteriogram was performed. The [facility] microcatheter was then advanced out into the mid splenic artery. Additional arteriography was performed utilizing power injection and digital subtraction angiography. Normal enhancement of the splenic parenchyma and filling of the splenic vein.  Additional branches are identified filling the pancreatic parenchyma. No definite filling of the renal angiomyolipoma. Additional efforts were made to attempt to select additional distal branches of the splenic artery. However, this was not successful given the high level of tortuosity of the system. The catheters were removed. Hemostasis was attained with the assistance of the Celt arterial closure device. IMPRESSION: 1. Super selective left renal arteriography targeting numerous segmental and interlobar branches with particle and liquid embolization of 2 interlobar branches providing supply to the underlying angiomyolipoma. 2. Selective catheterization of the celiac axis and splenic artery with arteriography. Signed, Sterling Big, MD, RPVI [facility] Electronically  Signed   By: Malachy Moan M.D.   On: 05/11/2023 17:19    Labs:  CBC: Recent Labs    03/12/23 0418 03/12/23 0726 03/12/23 1444 03/12/23 1445 03/13/23 0529 03/13/23 0742 03/13/23 1230 05/11/23 1152  WBC 12.1*  --  10.9*  --  8.1  --   --  6.2  HGB 8.2*   < > 7.8*   < > 7.5* 7.9* 7.6* 13.8  HCT 23.7*   < > 23.8*   < > 21.8* 23.4* 22.7* 43.4  PLT 116*  --  124*  --  111*  --   --  174   < > = values in this interval not displayed.    COAGS: Recent Labs    03/10/23 1627 03/11/23 0400 05/11/23 1152  INR 1.2 1.2 1.0  APTT 27 26  --     BMP: Recent Labs    03/12/23 0418 03/12/23 1444 03/13/23 0529 05/11/23 1152  NA 134* 134* 137 139  K 3.6 3.4* 3.4* 3.8  CL 101 101 104 107  CO2 24 26 25 22   GLUCOSE 114* 122* 106* 93  BUN 26* 23 14 21   CALCIUM 8.0* 8.4* 8.3* 8.5*  CREATININE 0.79 0.98 0.68 0.68  GFRNONAA >60 60* >60 >60    LIVER FUNCTION TESTS: Recent Labs    03/11/23 1752 03/12/23 0418 03/12/23 1444 03/13/23 0529  BILITOT 0.5 1.0 0.8 1.1  AST 82* 73* 55* 40  ALT 60* 61* 55* 46*  ALKPHOS 41 36* 37* 39  PROT 5.9* 5.3* 5.3* 5.3*  ALBUMIN 3.5 3.1* 3.0* 2.9*    TUMOR MARKERS: No results for input(s): "AFPTM", "CEA", "CA199", "CHROMGRNA" in the last 8760 hours.  Assessment and Plan:  Extremely pleasant 77 year old female with a history of a left renal angiomyolipoma which has now undergone 3 endovascular interventions with embolization.  She did have a spontaneous hemorrhage this past July and has since recovered.  Her most recent embolization was on May 11, 2023.  At this point, we will continue conservative management.  I have reviewed her case with my colleague, Dr. Crecencio Mc of [facility].  I would like to refer Mrs. Peine to him to get her plugged in with urology.  1.) Referral to Dr. Crecencio Mc of [facility]  2.)  I will ask my staff to order a CBC and BMP to be performed this week so that I can assess her renal function  and hemoglobin levels.  3.)  Plan for repeat renal protocol CT scan with and without contrast of the abdomen and pelvis in March 2025 with a clinical visit to follow.  4.)  She is to let us know if her hematuria becomes more significant and she sees any blood clots or her urine becomes dark red in color.    Electronically Signed: Vilma Prader  K Whitman Meinhardt 06/09/2023, 12:09 PM   I spent a total of  15 Minutes in remote  clinical consultation, greater than 50% of which was counseling/coordinating care for left renal AML.    Visit type: Audio only (telephone). Audio (no video) only due to patient preference. Alternative for in-person consultation at [facility], [address] [place], [place], [place]. This visit type was conducted due to national recommendations for restrictions regarding the COVID-19 Pandemic (e.g. social distancing).  This format is felt to be most appropriate for this patient at this time.  All issues noted in this document were discussed and addressed.

## 2023-06-10 DIAGNOSIS — D1771 Benign lipomatous neoplasm of kidney: Secondary | ICD-10-CM | POA: Diagnosis not present

## 2023-06-17 ENCOUNTER — Ambulatory Visit: Payer: Medicare Other | Admitting: Internal Medicine

## 2023-06-22 DIAGNOSIS — M1711 Unilateral primary osteoarthritis, right knee: Secondary | ICD-10-CM | POA: Diagnosis not present

## 2023-06-24 DIAGNOSIS — R002 Palpitations: Secondary | ICD-10-CM | POA: Diagnosis not present

## 2023-06-25 ENCOUNTER — Telehealth: Payer: Self-pay

## 2023-06-25 DIAGNOSIS — I499 Cardiac arrhythmia, unspecified: Secondary | ICD-10-CM | POA: Diagnosis not present

## 2023-06-25 DIAGNOSIS — I491 Atrial premature depolarization: Secondary | ICD-10-CM | POA: Diagnosis not present

## 2023-06-25 NOTE — Telephone Encounter (Signed)
The patient was referred to Dr. Lalla Brothers for LAAO.  She saw Dr. Gary Fleet at [facility] consult 06/24/2023 and has decided to have the procedure done there. Will cancel upcoming appointment with Dr. Lalla Brothers. She is scheduled to see Dr. Royann Shivers in January 2025. She was grateful for follow-up.

## 2023-07-08 DIAGNOSIS — I4891 Unspecified atrial fibrillation: Secondary | ICD-10-CM

## 2023-07-22 ENCOUNTER — Other Ambulatory Visit: Payer: Self-pay | Admitting: Internal Medicine

## 2023-07-22 DIAGNOSIS — Z1231 Encounter for screening mammogram for malignant neoplasm of breast: Secondary | ICD-10-CM

## 2023-07-23 DIAGNOSIS — I4891 Unspecified atrial fibrillation: Secondary | ICD-10-CM | POA: Diagnosis not present

## 2023-07-28 ENCOUNTER — Encounter: Payer: Self-pay | Admitting: Cardiovascular Disease

## 2023-07-31 DIAGNOSIS — D649 Anemia, unspecified: Secondary | ICD-10-CM | POA: Diagnosis not present

## 2023-07-31 DIAGNOSIS — E041 Nontoxic single thyroid nodule: Secondary | ICD-10-CM | POA: Diagnosis not present

## 2023-07-31 DIAGNOSIS — E785 Hyperlipidemia, unspecified: Secondary | ICD-10-CM | POA: Diagnosis not present

## 2023-07-31 DIAGNOSIS — M810 Age-related osteoporosis without current pathological fracture: Secondary | ICD-10-CM | POA: Diagnosis not present

## 2023-08-04 DIAGNOSIS — E785 Hyperlipidemia, unspecified: Secondary | ICD-10-CM | POA: Diagnosis not present

## 2023-08-04 DIAGNOSIS — D649 Anemia, unspecified: Secondary | ICD-10-CM | POA: Diagnosis not present

## 2023-08-07 DIAGNOSIS — Z0000 Encounter for general adult medical examination without abnormal findings: Secondary | ICD-10-CM | POA: Diagnosis not present

## 2023-08-07 DIAGNOSIS — Z23 Encounter for immunization: Secondary | ICD-10-CM | POA: Diagnosis not present

## 2023-08-07 DIAGNOSIS — I700 Atherosclerosis of aorta: Secondary | ICD-10-CM | POA: Diagnosis not present

## 2023-08-10 NOTE — Addendum Note (Signed)
Encounter addended by: Arby Barrette on: 08/10/2023 12:33 PM  Actions taken: Imaging Exam ended

## 2023-08-21 ENCOUNTER — Institutional Professional Consult (permissible substitution): Payer: Medicare Other | Admitting: Cardiology

## 2023-08-24 ENCOUNTER — Ambulatory Visit
Admission: RE | Admit: 2023-08-24 | Discharge: 2023-08-24 | Disposition: A | Discharge status: Home / Self Care | Payer: Medicare Other | Source: Ambulatory Visit | Attending: Internal Medicine | Admitting: Internal Medicine

## 2023-08-24 DIAGNOSIS — Z1231 Encounter for screening mammogram for malignant neoplasm of breast: Secondary | ICD-10-CM | POA: Diagnosis not present

## 2023-09-07 ENCOUNTER — Ambulatory Visit: Payer: Medicare Other | Attending: Cardiovascular Disease | Admitting: Cardiovascular Disease

## 2023-09-07 ENCOUNTER — Encounter: Payer: Self-pay | Admitting: Cardiovascular Disease

## 2023-09-07 VITALS — BP 142/72 | HR 70 | Ht 67.0 in | Wt 168.6 lb

## 2023-09-07 DIAGNOSIS — I2721 Secondary pulmonary arterial hypertension: Secondary | ICD-10-CM

## 2023-09-07 DIAGNOSIS — D1771 Benign lipomatous neoplasm of kidney: Secondary | ICD-10-CM | POA: Diagnosis not present

## 2023-09-07 DIAGNOSIS — I4891 Unspecified atrial fibrillation: Secondary | ICD-10-CM | POA: Diagnosis not present

## 2023-09-07 MED ORDER — APIXABAN 5 MG PO TABS: 5.0000 mg | ORAL_TABLET | Freq: Two times a day (BID) | ORAL | 3 refills | Status: DC

## 2023-09-07 NOTE — Patient Instructions (Signed)

## 2023-09-07 NOTE — Progress Notes (Unsigned)
Cardiology Office Note:  .   Date:  09/09/2023  ID:  Ann Vang, DOB Dec 10, 1945, MRN [id number] PCP: Alysia Penna, MD  [facility] Providers Cardiologist:  None    History of Present Illness: .   Ann Vang is a 78 y.o. female with paroxysmal atrial fibrillation, otherwise good health.  She was initially diagnosed with atrial fibrillation at the time of a gynecological procedure that was canceled due to RVR. Follow-up event monitor showed a roughly 9% prevalence of paroxysmal atrial fibrillation.  She was started on amiodarone at that time due to inability to maintain normal rhythm and rate so that she could have the procedure performed.   On her smart watch she has only had 1 episode of recurrent atrial fibrillation since starting amiodarone.  Currently asymptomatic.  She has stopped the amiodarone due to concerns regarding its side effects.  So far no A-fib recurrence.  She had hematuria related to an angiomyolipoma of her left kidney that required arterial embolization and blood transfusions.  She has decided to have a combined ablation/Watchman procedure.  Her daughter is a Scientist, clinical (histocompatibility and immunogenetics) in the [employer] [facility] [employer] system and she has decided to have her procedure done with Dr. Gary Fleet.  She does not have any significant structural heart disease echo in 2021 showed normal LV systolic and diastolic function and normal size atria, no valvular problems), heart failure, known PAD or CAD hypertension, sleep apnea, diabetes mellitus or previous history of stroke or TIA.  The patient specifically denies any chest pain at rest or with exertion, dyspnea at rest or with exertion, orthopnea, paroxysmal nocturnal dyspnea, syncope, palpitations, focal neurological deficits, intermittent claudication, lower extremity edema, unexplained weight gain, cough, hemoptysis or wheezing.   Studies Reviewed: .        Echo 2021  1. Left ventricular ejection fraction, by estimation, is 65 to 70%. The  left  ventricle has normal function. The left ventricle has no regional  wall motion abnormalities. There is mild asymmetric left ventricular  hypertrophy of the basal-septal segment.  Left ventricular diastolic parameters are indeterminate.   2. Right ventricular systolic function is normal. The right ventricular  size is normal. There is mildly elevated pulmonary artery systolic  pressure. The estimated right ventricular systolic pressure is 41.2 mmHg.   3. The mitral valve is normal in structure. Trivial mitral valve  regurgitation.   4. The aortic valve is tricuspid. Aortic valve regurgitation is not  visualized. No aortic stenosis is present.   5. There is mild dilatation of the ascending aorta, measuring 38 mm.   6. The inferior vena cava is normal in size with greater than 50%  respiratory variability, suggesting right atrial pressure of 3 mmHg.  Risk Assessment/Calculations:    CHA2DS2-VASc Score =   3 (age, gender)         Physical Exam:   VS:  BP (!) 142/72   Pulse 70   Ht 5\' 7"  (1.702 m)   Wt 168 lb 9.6 oz (76.5 kg)   LMP 08/18/1994 (Approximate)   SpO2 98%   BMI 26.41 kg/m    Wt Readings from Last 3 Encounters:  09/07/23 168 lb 9.6 oz (76.5 kg)  05/07/23 164 lb (74.4 kg)  03/16/23 164 lb (74.4 kg)    GEN: Well nourished, well developed in no acute distress.  Appears fit and younger than stated age. NECK: No JVD; No carotid bruits CARDIAC: RRR, no murmurs, rubs, gallops RESPIRATORY:  Clear to auscultation without rales, wheezing or  rhonchi  ABDOMEN: Soft, non-tender, non-distended EXTREMITIES:  No edema; No deformity   ASSESSMENT AND PLAN: .    "Lone" paroxysmal atrial fibrillation: Arrhythmia is well-controlled on amiodarone, but she has had serious complications due to the anticoagulation.  Scheduled for a combined ablation/Watchman procedure with Dr. Gary Fleet at [facility]. CHA2DS2-VASc score is 68 (age, gender).   Anticoagulation: No recent bleeding problems.   Understands that she will need to continue anticoagulation for at least 6 weeks following the Watchman procedure. Elevated PA pressure: Will reevaluate this with another echocardiogram in the future.  Reviewed the echocardiogram from 2021. I think the estimation of her PA pressure was accurate.  This was mildly elevated at about 40 mmHg.  On the other hand the right atrium and right ventricle are normal in size and function and the inferior vena cava was not dilated.  We should probably reassess this with an echocardiogram.  The mechanism is unclear.  She appears to have normal systolic and diastolic LV function.  Does not have chronic lung problems or any symptoms that would suggest sleep apnea.  Previous CT angiography of the chest has not shown findings worrisome for previous pulmonary embolism. Left renal angiomyolipoma: Had transarterial embolization in 2018.  Complicated by subcapsular and perirenal hematoma in July 2024.        Dispo: Low risk for upcoming interventional radiology procedure.  Okay to stop Eliquis in anticipation of the procedure.  Stop amiodarone after the procedure.  Referred to Dr. Lalla Brothers to discuss Watchman device.  Signed, Thurmon Fair, MD

## 2023-09-09 ENCOUNTER — Encounter: Payer: Self-pay | Admitting: Cardiovascular Disease

## 2023-09-25 DIAGNOSIS — D2262 Melanocytic nevi of left upper limb, including shoulder: Secondary | ICD-10-CM | POA: Diagnosis not present

## 2023-09-25 DIAGNOSIS — D225 Melanocytic nevi of trunk: Secondary | ICD-10-CM | POA: Diagnosis not present

## 2023-09-25 DIAGNOSIS — L578 Other skin changes due to chronic exposure to nonionizing radiation: Secondary | ICD-10-CM | POA: Diagnosis not present

## 2023-09-25 DIAGNOSIS — D1722 Benign lipomatous neoplasm of skin and subcutaneous tissue of left arm: Secondary | ICD-10-CM | POA: Diagnosis not present

## 2023-09-25 DIAGNOSIS — C44712 Basal cell carcinoma of skin of right lower limb, including hip: Secondary | ICD-10-CM | POA: Diagnosis not present

## 2023-09-25 DIAGNOSIS — D485 Neoplasm of uncertain behavior of skin: Secondary | ICD-10-CM | POA: Diagnosis not present

## 2023-09-25 DIAGNOSIS — L821 Other seborrheic keratosis: Secondary | ICD-10-CM | POA: Diagnosis not present

## 2023-09-30 DIAGNOSIS — K080 Exfoliation of teeth due to systemic causes: Secondary | ICD-10-CM | POA: Diagnosis not present

## 2023-10-01 DIAGNOSIS — M25512 Pain in left shoulder: Secondary | ICD-10-CM | POA: Diagnosis not present

## 2023-10-08 DIAGNOSIS — I4891 Unspecified atrial fibrillation: Secondary | ICD-10-CM | POA: Diagnosis not present

## 2023-10-12 DIAGNOSIS — Z01419 Encounter for gynecological examination (general) (routine) without abnormal findings: Secondary | ICD-10-CM | POA: Diagnosis not present

## 2023-10-12 DIAGNOSIS — M1711 Unilateral primary osteoarthritis, right knee: Secondary | ICD-10-CM | POA: Diagnosis not present

## 2023-10-12 DIAGNOSIS — Z6826 Body mass index (BMI) 26.0-26.9, adult: Secondary | ICD-10-CM | POA: Diagnosis not present

## 2023-10-19 DIAGNOSIS — Z7901 Long term (current) use of anticoagulants: Secondary | ICD-10-CM | POA: Diagnosis not present

## 2023-10-19 DIAGNOSIS — I517 Cardiomegaly: Secondary | ICD-10-CM | POA: Diagnosis not present

## 2023-10-19 DIAGNOSIS — Z79899 Other long term (current) drug therapy: Secondary | ICD-10-CM | POA: Diagnosis not present

## 2023-10-19 DIAGNOSIS — I4891 Unspecified atrial fibrillation: Secondary | ICD-10-CM | POA: Diagnosis not present

## 2023-10-19 DIAGNOSIS — R918 Other nonspecific abnormal finding of lung field: Secondary | ICD-10-CM | POA: Diagnosis not present

## 2023-10-19 DIAGNOSIS — I480 Paroxysmal atrial fibrillation: Secondary | ICD-10-CM | POA: Diagnosis not present

## 2023-10-19 DIAGNOSIS — Z01818 Encounter for other preprocedural examination: Secondary | ICD-10-CM | POA: Diagnosis not present

## 2023-10-19 DIAGNOSIS — R002 Palpitations: Secondary | ICD-10-CM | POA: Diagnosis not present

## 2023-10-27 ENCOUNTER — Emergency Department (HOSPITAL_BASED_OUTPATIENT_CLINIC_OR_DEPARTMENT_OTHER)

## 2023-10-27 ENCOUNTER — Emergency Department (HOSPITAL_BASED_OUTPATIENT_CLINIC_OR_DEPARTMENT_OTHER)
Admission: EM | Admit: 2023-10-27 | Discharge: 2023-10-27 | Disposition: A | Discharge status: Home / Self Care | Attending: Emergency Medicine | Admitting: Emergency Medicine

## 2023-10-27 ENCOUNTER — Other Ambulatory Visit: Payer: Self-pay

## 2023-10-27 ENCOUNTER — Encounter (HOSPITAL_BASED_OUTPATIENT_CLINIC_OR_DEPARTMENT_OTHER): Payer: Self-pay | Admitting: Urology

## 2023-10-27 DIAGNOSIS — Z7901 Long term (current) use of anticoagulants: Secondary | ICD-10-CM | POA: Diagnosis not present

## 2023-10-27 DIAGNOSIS — N200 Calculus of kidney: Secondary | ICD-10-CM

## 2023-10-27 DIAGNOSIS — K5730 Diverticulosis of large intestine without perforation or abscess without bleeding: Secondary | ICD-10-CM | POA: Diagnosis not present

## 2023-10-27 DIAGNOSIS — K7689 Other specified diseases of liver: Secondary | ICD-10-CM | POA: Diagnosis not present

## 2023-10-27 DIAGNOSIS — N132 Hydronephrosis with renal and ureteral calculous obstruction: Secondary | ICD-10-CM | POA: Insufficient documentation

## 2023-10-27 DIAGNOSIS — R109 Unspecified abdominal pain: Secondary | ICD-10-CM | POA: Diagnosis not present

## 2023-10-27 DIAGNOSIS — R1031 Right lower quadrant pain: Secondary | ICD-10-CM | POA: Diagnosis not present

## 2023-10-27 LAB — HEPATIC FUNCTION PANEL
ALT: 27 U/L (ref 0–44)
AST: 36 U/L (ref 15–41)
Albumin: 3.8 g/dL (ref 3.5–5.0)
Alkaline Phosphatase: 47 U/L (ref 38–126)
Bilirubin, Direct: 0.2 mg/dL (ref 0.0–0.2)
Indirect Bilirubin: 0.7 mg/dL (ref 0.3–0.9)
Total Bilirubin: 0.9 mg/dL (ref 0.0–1.2)
Total Protein: 6.7 g/dL (ref 6.5–8.1)

## 2023-10-27 LAB — URINALYSIS, ROUTINE W REFLEX MICROSCOPIC
Bilirubin Urine: NEGATIVE
Glucose, UA: NEGATIVE mg/dL
Ketones, ur: NEGATIVE mg/dL
Leukocytes,Ua: NEGATIVE
Nitrite: NEGATIVE
Protein, ur: 30 mg/dL — AB
Specific Gravity, Urine: 1.02 (ref 1.005–1.030)
pH: 7 (ref 5.0–8.0)

## 2023-10-27 LAB — BASIC METABOLIC PANEL
Anion gap: 11 (ref 5–15)
BUN: 27 mg/dL — ABNORMAL HIGH (ref 8–23)
CO2: 21 mmol/L — ABNORMAL LOW (ref 22–32)
Calcium: 9 mg/dL (ref 8.9–10.3)
Chloride: 108 mmol/L (ref 98–111)
Creatinine, Ser: 1.24 mg/dL — ABNORMAL HIGH (ref 0.44–1.00)
GFR, Estimated: 45 mL/min — ABNORMAL LOW (ref >60)
Glucose, Bld: 119 mg/dL — ABNORMAL HIGH (ref 70–99)
Potassium: 3.9 mmol/L (ref 3.5–5.1)
Sodium: 140 mmol/L (ref 135–145)

## 2023-10-27 LAB — URINALYSIS, MICROSCOPIC (REFLEX): RBC / HPF: >50 RBC/hpf (ref 0–5)

## 2023-10-27 LAB — LIPASE, BLOOD: Lipase: 28 U/L (ref 11–51)

## 2023-10-27 LAB — CBC
HCT: 41.3 % (ref 36.0–46.0)
Hemoglobin: 14.3 g/dL (ref 12.0–15.0)
MCH: 31.8 pg (ref 26.0–34.0)
MCHC: 34.6 g/dL (ref 30.0–36.0)
MCV: 91.8 fL (ref 80.0–100.0)
Platelets: 188 10*3/uL (ref 150–400)
RBC: 4.5 MIL/uL (ref 3.87–5.11)
RDW: 12.1 % (ref 11.5–15.5)
WBC: 7.8 10*3/uL (ref 4.0–10.5)
nRBC: 0 % (ref 0.0–0.2)

## 2023-10-27 MED ORDER — MORPHINE SULFATE (PF) 4 MG/ML IV SOLN
4.0000 mg | Freq: Once | INTRAVENOUS | Status: AC
  Administered 2023-10-27: 4 mg via INTRAVENOUS
  Filled 2023-10-27: qty 1

## 2023-10-27 MED ORDER — SODIUM CHLORIDE 0.9 % IV BOLUS
1000.0000 mL | Freq: Once | INTRAVENOUS | Status: AC
  Administered 2023-10-27: 1000 mL via INTRAVENOUS

## 2023-10-27 MED ORDER — MORPHINE SULFATE 15 MG PO TABS: 7.5000 mg | ORAL_TABLET | ORAL | 0 refills | Status: DC | PRN

## 2023-10-27 MED ORDER — ONDANSETRON HCL 4 MG/2ML IJ SOLN
4.0000 mg | Freq: Once | INTRAMUSCULAR | Status: AC
Start: 2023-10-27 — End: 2023-10-27
  Administered 2023-10-27: 4 mg via INTRAVENOUS
  Filled 2023-10-27: qty 2

## 2023-10-27 MED ORDER — ONDANSETRON 4 MG PO TBDP: ORAL_TABLET | ORAL | 0 refills | Status: DC

## 2023-10-27 MED ORDER — TAMSULOSIN HCL 0.4 MG PO CAPS: 0.4000 mg | ORAL_CAPSULE | Freq: Every day | ORAL | 0 refills | Status: DC

## 2023-10-27 NOTE — Discharge Instructions (Signed)
 Follow up with the urologist in the office.    Take 4 over the counter ibuprofen tablets 3 times a day or 2 over-the-counter naproxen tablets twice a day for pain. Also take tylenol 1000mg (2 extra strength) four times a day.   Then take the pain medicine if you feel like you need it. Narcotics do not help with the pain, they only make you care about it less.  You can become addicted to this, people may break into your house to steal it.  It will constipate you.  If you drive under the influence of this medicine you can get a DUI.

## 2023-10-27 NOTE — ED Triage Notes (Signed)
 Pt states right sided flank that started last night  Pain is constant  Denies urinary complaints  Reports some N/V as well

## 2023-10-27 NOTE — ED Notes (Signed)
 Reviewed discharge instructions, follow up, medications with pt. Pt states understanding and pain free and ambulatory.

## 2023-10-27 NOTE — ED Provider Notes (Signed)
 [place] [facility] AT [facility] Provider Note   CSN: [id number] Arrival date & time: 10/27/23  4098     History  Chief Complaint  Patient presents with   Flank Pain    Ann Vang is a 78 y.o. female.  78 yo F with a cc of right lower quadrant abdominal pain.  This been going on since last night.  She thinks started towards the center of her abdomen and that has migrated now into the right lower quadrant.  Perhaps some subjective fevers but no measurable fever nausea and vomiting.  She looked this up online and was worried that it might be her appendix and came in for evaluation.   Flank Pain       Home Medications Prior to Admission medications   Medication Sig Start Date End Date Taking? Authorizing Provider  morphine (MSIR) 15 MG tablet Take 0.5 tablets (7.5 mg total) by mouth every 4 (four) hours as needed. 10/27/23  Yes Melene Plan, DO  ondansetron (ZOFRAN-ODT) 4 MG disintegrating tablet 4mg  ODT q4 hours prn nausea/vomit 10/27/23  Yes Melene Plan, DO  tamsulosin (FLOMAX) 0.4 MG CAPS capsule Take 1 capsule (0.4 mg total) by mouth daily after supper. 10/27/23  Yes Melene Plan, DO  apixaban (ELIQUIS) 5 MG TABS tablet Take 1 tablet (5 mg total) by mouth 2 (two) times daily. 09/07/23   Croitoru, Mihai, MD  Bacillus Coagulans-Inulin (PROBIOTIC) 1-250 BILLION-MG CAPS Take 1 capsule by mouth daily.    [provider]  Cholecalciferol (VITAMIN D) 2000 UNITS tablet Take 2,000 Units by mouth daily.    [provider]  estradiol (VIVELLE-DOT) 0.0375 MG/24HR Place 1 patch onto the skin 2 (two) times a week. 04/16/21   [provider]  fluticasone (FLONASE) 50 MCG/ACT nasal spray Place 2 sprays into the nose daily. Patient taking differently: Place 2 sprays into the nose daily as needed for allergies. 07/13/12   Edwyna Perfect, MD  magnesium gluconate (MAGONATE) 500 MG tablet Take 500 mg by mouth 2 (two) times daily.    [provider]  milk thistle 175 MG tablet Take 175 mg by mouth daily.    [provider]  OVER THE COUNTER MEDICATION Take 1 tablet by mouth daily at 6 (six) AM. Medication: Vitamin K7    [provider]  progesterone (PROMETRIUM) 100 MG capsule Take 100 mg by mouth daily. 03/22/23   [provider]  SUMAtriptan (IMITREX) 50 MG tablet Take 50 mg by mouth every 2 (two) hours as needed for migraine. 03/30/23   [provider]      Allergies    Patient has no known allergies.    Review of Systems   Review of Systems  Genitourinary:  Positive for flank pain.    Physical Exam Updated Vital Signs BP (!) 164/86   Pulse 96   Temp 98.5 F (36.9 C)   Resp 16   Ht 5\' 7"  (1.702 m)   Wt 76.5 kg   LMP 08/18/1994 (Approximate)   SpO2 93%   BMI 26.41 kg/m  Physical Exam Vitals and nursing note reviewed.  Constitutional:      General: She is not in acute distress.    Appearance: She is well-developed. She is not diaphoretic.  HENT:     Head: Normocephalic and atraumatic.  Eyes:     Pupils: Pupils are equal, round, and reactive to light.  Cardiovascular:     Rate and Rhythm: Normal rate and regular  rhythm.     Heart sounds: No murmur heard.    No friction rub. No gallop.  Pulmonary:     Effort: Pulmonary effort is normal.     Breath sounds: No wheezing or rales.  Abdominal:     General: There is no distension.     Palpations: Abdomen is soft.     Tenderness: There is no abdominal tenderness.     Comments: Some discomfort to palpation of the right side of the abdomen no obvious focality and no obvious guarding or rebound  Musculoskeletal:        General: No tenderness.     Cervical back: Normal range of motion and neck supple.  Skin:    General: Skin is warm and dry.  Neurological:     Mental Status: She is alert and oriented to person, place, and time.  Psychiatric:        Behavior: Behavior normal.     ED Results / Procedures / Treatments    Labs (all labs ordered are listed, but only abnormal results are displayed) Labs Reviewed  URINALYSIS, ROUTINE W REFLEX MICROSCOPIC - Abnormal; Notable for the following components:      Result Value   Color, Urine BROWN (*)    APPearance CLOUDY (*)    Hgb urine dipstick LARGE (*)    Protein, ur 30 (*)    All other components within normal limits  BASIC METABOLIC PANEL - Abnormal; Notable for the following components:   CO2 21 (*)    Glucose, Bld 119 (*)    BUN 27 (*)    Creatinine, Ser 1.24 (*)    GFR, Estimated 45 (*)    All other components within normal limits  URINALYSIS, MICROSCOPIC (REFLEX) - Abnormal; Notable for the following components:   Bacteria, UA FEW (*)    All other components within normal limits  CBC  HEPATIC FUNCTION PANEL  LIPASE, BLOOD    EKG None  Radiology CT Renal Stone Study Result Date: 10/27/2023 CLINICAL DATA:  Abdominal/flank pain, stone suspected. EXAM: CT ABDOMEN AND PELVIS WITHOUT CONTRAST TECHNIQUE: Multidetector CT imaging of the abdomen and pelvis was performed following the standard protocol without IV contrast. RADIATION DOSE REDUCTION: This exam was performed according to the departmental dose-optimization program which includes automated exposure control, adjustment of the mA and/or kV according to patient size and/or use of iterative reconstruction technique. COMPARISON:  CT scan abdomen and pelvis from 03/10/2023. FINDINGS: Lower chest: There are subpleural atelectatic changes in the visualized lung bases. No overt consolidation. No pleural effusion. The heart is normal in size. No pericardial effusion. Hepatobiliary: The liver is normal in size. Non-cirrhotic configuration. No suspicious mass. Redemonstration of cysts in the liver, better characterized on the prior CT scan from 03/10/2023. No intrahepatic or extrahepatic bile duct dilation. No calcified gallstones. Normal gallbladder wall thickness. No pericholecystic inflammatory changes.  Pancreas: Unremarkable. No pancreatic ductal dilatation or surrounding inflammatory changes. Spleen: Within normal limits. No focal lesion. Adrenals/Urinary Tract: Adrenal glands are unremarkable. No suspicious renal mass within the limitations of this unenhanced exam. There is metallic presumed embolization coils in the left kidney upper pole, anteriorly. There is a 4 x 9 mm nonobstructing calculus in the left kidney lower pole calyx. No left hydroureteronephrosis. There is a 3 x 4 mm right basically to junction calculus causing mild to moderate proximal hydronephrosis. Right ureter distal to the calculus is unremarkable. There are additional 3-4, 1-1.5 mm calculi in the right kidney. Urinary bladder is under distended,  precluding optimal assessment. However, no large mass or stones identified. No perivesical fat stranding. Stomach/Bowel: No disproportionate dilation of the small or large bowel loops. No evidence of abnormal bowel wall thickening or inflammatory changes. The appendix is unremarkable. There are multiple diverticula throughout the colon, without imaging signs of diverticulitis. Vascular/Lymphatic: No ascites or pneumoperitoneum. No abdominal or pelvic lymphadenopathy, by size criteria. No aneurysmal dilation of the major abdominal arteries. There are mild peripheral atherosclerotic vascular calcifications of the aorta and its major branches. Reproductive: The uterus is unremarkable. Stable right adnexal mass containing several sub 5 mm foci of calcifications, favored to represent pedunculated subserosal leiomyoma. No left adnexal mass seen. Other: The visualized soft tissues and abdominal wall are unremarkable. Musculoskeletal: No suspicious osseous lesions. There are moderate multilevel degenerative changes in the visualized spine. Redemonstration of old compression deformities of T12, L2 and L4 vertebra. No significant retropulsion or spinal canal compromise. IMPRESSION: 1. There is a 3 x 4 mm  right ureteropelvic junction calculus causing mild-to-moderate proximal hydronephrosis. There are additional bilateral nonobstructing renal calculi, as described above. 2. Multiple other nonacute observations, as described above. 3. Aortic atherosclerosis. Aortic Atherosclerosis (ICD10-I70.0). Electronically Signed   By: Jules Schick M.D.   On: 10/27/2023 14:09    Procedures Procedures    Medications Ordered in ED Medications  sodium chloride 0.9 % bolus 1,000 mL (0 mLs Intravenous Stopped 10/27/23 1259)  morphine (PF) 4 MG/ML injection 4 mg (4 mg Intravenous Given 10/27/23 1050)  ondansetron (ZOFRAN) injection 4 mg (4 mg Intravenous Given 10/27/23 1054)    ED Course/ Medical Decision Making/ A&P                                 Medical Decision Making Amount and/or Complexity of Data Reviewed Labs: ordered. Radiology: ordered.  Risk Prescription drug management.   78 yo F with a chief complaints of right lower quadrant abdominal pain.  This has been going on for about 12 hours now.  Patient is worried about appendicitis.  By history and physical I think it is more likely to be nephrolithiasis.  Regardless we will obtain a laboratory evaluation treat pain and nausea CT imaging.  UA with hematuria and no infection.   No leukocytosis, no significant electrolyte abnormalities.   Feeling much better on reassessment.   My read of CT with proximal R sided nephrolithiasis with hydro.   D/c home.  Urology follow up.   3:02 PM:  I have discussed the diagnosis/risks/treatment options with the patient and family.  Evaluation and diagnostic testing in the emergency department does not suggest an emergent condition requiring admission or immediate intervention beyond what has been performed at this time.  They will follow up with Urology. We also discussed returning to the ED immediately if new or worsening sx occur. We discussed the sx which are most concerning (e.g., sudden worsening pain,  fever, inability to tolerate by mouth) that necessitate immediate return. Medications administered to the patient during their visit and any new prescriptions provided to the patient are listed below.  Medications given during this visit Medications  sodium chloride 0.9 % bolus 1,000 mL (0 mLs Intravenous Stopped 10/27/23 1259)  morphine (PF) 4 MG/ML injection 4 mg (4 mg Intravenous Given 10/27/23 1050)  ondansetron (ZOFRAN) injection 4 mg (4 mg Intravenous Given 10/27/23 1054)     The patient appears reasonably screen and/or stabilized for discharge and I doubt any other  medical condition or other [facility] requiring further screening, evaluation, or treatment in the ED at this time prior to discharge.          Final Clinical Impression(s) / ED Diagnoses Final diagnoses:  Nephrolithiasis    Rx / DC Orders ED Discharge Orders          Ordered    morphine (MSIR) 15 MG tablet  Every 4 hours PRN        10/27/23 1456    ondansetron (ZOFRAN-ODT) 4 MG disintegrating tablet        10/27/23 1456    tamsulosin (FLOMAX) 0.4 MG CAPS capsule  Daily after supper        10/27/23 1456              Melene Plan, DO 10/27/23 1502

## 2023-11-02 DIAGNOSIS — N200 Calculus of kidney: Secondary | ICD-10-CM | POA: Diagnosis not present

## 2023-11-02 DIAGNOSIS — D1771 Benign lipomatous neoplasm of kidney: Secondary | ICD-10-CM | POA: Diagnosis not present

## 2023-11-02 DIAGNOSIS — R310 Gross hematuria: Secondary | ICD-10-CM | POA: Diagnosis not present

## 2023-11-02 DIAGNOSIS — R82998 Other abnormal findings in urine: Secondary | ICD-10-CM | POA: Diagnosis not present

## 2023-11-04 DIAGNOSIS — M7542 Impingement syndrome of left shoulder: Secondary | ICD-10-CM | POA: Diagnosis not present

## 2023-11-13 DIAGNOSIS — I480 Paroxysmal atrial fibrillation: Secondary | ICD-10-CM | POA: Diagnosis not present

## 2023-11-13 DIAGNOSIS — Z79899 Other long term (current) drug therapy: Secondary | ICD-10-CM | POA: Diagnosis not present

## 2023-11-13 DIAGNOSIS — Z006 Encounter for examination for normal comparison and control in clinical research program: Secondary | ICD-10-CM | POA: Diagnosis not present

## 2023-11-13 DIAGNOSIS — I510 Cardiac septal defect, acquired: Secondary | ICD-10-CM | POA: Diagnosis not present

## 2023-11-13 DIAGNOSIS — Z95811 Presence of heart assist device: Secondary | ICD-10-CM | POA: Diagnosis not present

## 2023-11-13 DIAGNOSIS — Z950 Presence of cardiac pacemaker: Secondary | ICD-10-CM | POA: Diagnosis not present

## 2023-11-13 DIAGNOSIS — Z7901 Long term (current) use of anticoagulants: Secondary | ICD-10-CM | POA: Diagnosis not present

## 2023-11-14 DIAGNOSIS — Z136 Encounter for screening for cardiovascular disorders: Secondary | ICD-10-CM | POA: Diagnosis not present

## 2023-11-19 NOTE — Therapy (Signed)
 OUTPATIENT PHYSICAL THERAPY SHOULDER EVALUATION   Patient Name: Ann Vang MRN: [id number] DOB:1946-02-22, 78 y.o., female Today's Date: 11/23/2023  END OF SESSION:  PT End of Session - 11/23/23 0843     Visit Number 1    Date for PT Re-Evaluation 02/01/24    Authorization Type BCBS    PT Start Time 0845    PT Stop Time 0930    PT Time Calculation (min) 45 min    Activity Tolerance Patient tolerated treatment well             Past Medical History:  Diagnosis Date   Abnormal breast finding 12/1998   left breast abnl   Arthritis    --basal joint--right hand   Carpal tunnel syndrome, right    Compression fracture of T2 vertebra (HCC) 05/10/2019   Diverticulosis 2003   mild   Lumbar compression fracture (HCC) 07/13/2012   Lumbar disc disorder 2000   bulging   Melena    Menopause    Osteoporosis    Fx L4 age 42 water skiing accident   Palpitations    Thyroid disease 10/2014   thyroid nodules--bx'd large nodule and was benign--sees Gen. Careers adviser with [facility]   Past Surgical History:  Procedure Laterality Date   AUGMENTATION MAMMAPLASTY  1983   Implants have been removed   BREAST SURGERY  2010   implants removed ruptured   CERVICAL BIOPSY  W/ LOOP ELECTRODE EXCISION  1996   CIN   IR ANGIOGRAM SELECTIVE EACH ADDITIONAL VESSEL  03/10/2023   IR ANGIOGRAM SELECTIVE EACH ADDITIONAL VESSEL  03/10/2023   IR ANGIOGRAM SELECTIVE EACH ADDITIONAL VESSEL  03/10/2023   IR ANGIOGRAM SELECTIVE EACH ADDITIONAL VESSEL  05/11/2023   IR ANGIOGRAM SELECTIVE EACH ADDITIONAL VESSEL  05/11/2023   IR ANGIOGRAM SELECTIVE EACH ADDITIONAL VESSEL  05/11/2023   IR ANGIOGRAM VISCERAL SELECTIVE  05/11/2023   IR EMBO ART  VEN HEMORR LYMPH EXTRAV  INC GUIDE ROADMAPPING  03/10/2023   IR EMBO ART  VEN HEMORR LYMPH EXTRAV  INC GUIDE ROADMAPPING  05/11/2023   IR EMBO TUMOR ORGAN ISCHEMIA INFARCT INC GUIDE ROADMAPPING  05/13/2017   IR RADIOLOGIST EVAL & MGMT  04/15/2017   IR RADIOLOGIST EVAL & MGMT   05/27/2017   IR RADIOLOGIST EVAL & MGMT  01/21/2018   IR RADIOLOGIST EVAL & MGMT  09/28/2019   IR RADIOLOGIST EVAL & MGMT  10/07/2021   IR RADIOLOGIST EVAL & MGMT  03/31/2023   IR RADIOLOGIST EVAL & MGMT  06/09/2023   IR RENAL SELECTIVE  UNI INC S&I MOD SED  03/10/2023   IR RENAL SUPRASEL UNI S&I MOD SED  05/13/2017   IR RENAL SUPRASEL UNI S&I MOD SED  05/11/2023   IR RENAL SUPRASEL UNI S&I MOD SED  05/11/2023   IR US GUIDE VASC ACCESS LEFT  05/13/2017   IR US GUIDE VASC ACCESS RIGHT  03/10/2023   IR US GUIDE VASC ACCESS RIGHT  05/11/2023   KNEE SURGERY  2012   meniscus rt knee (both)   L-2 fracture  2009   fall in aerobics   L-4 fracture   water skiing     TENOSYNOVECTOMY Right 03/11/2016   Procedure: TENOSYNOVECTOMY FLEXOR RIGHT RING FINGER;  Surgeon: Cindee Salt, MD;  Location: [facility];  Service: Orthopedics;  Laterality: Right;   thyroid nodule biopsy  10/2014   --[facility], [place]--   TONSILLECTOMY AND ADENOIDECTOMY  1964   Patient Active Problem List   Diagnosis Date Noted  Renal angiomyolipoma 05/11/2023   Hypokalemia 03/11/2023   Hyponatremia 03/11/2023   Hematoma 03/11/2023   Diverticulosis 03/10/2023   Renal hematoma, left 03/10/2023   Paroxysmal atrial fibrillation on Eliquis (HCC) 10/02/2020   Compression fracture of T2 vertebra (HCC) 05/10/2019   Left renal hematoma with extravasation s/p embolectomy 03/10/2023 12/22/2018   Low back pain 12/21/2018   Acute pain of right shoulder 11/19/2017   Angiomyolipoma of kidney 05/13/2017   Chronic pain of right knee 09/25/2016   Flexor tenosynovitis of finger 03/14/2016   Pain 02/20/2016   Thyroid nodule 05/15/2015   Lipoma of back 10/06/2014   Lumbar compression fracture (HCC) 07/13/2012   Ear pressure 07/13/2012   DYSFUNCTION OF EUSTACHIAN TUBE 07/30/2010   Disorder of bone and cartilage 03/15/2010    PCP: Alysia Penna  REFERRING PROVIDER: Francena Hanly  REFERRING DIAG: H84.696 (ICD-10-CM) - Pain in  left shoulder   THERAPY DIAG:  Acute pain of left shoulder  Muscle weakness (generalized)  Impingement syndrome of left shoulder  Rationale for Evaluation and Treatment: Rehabilitation  ONSET DATE: 11/04/23  SUBJECTIVE:                                                                                                                                                                                      SUBJECTIVE STATEMENT: Probably more than 1 month ago, I was getting ready for bed and I has severe pain in my shoulder. I wasn't doing anything when it happened. I do exercise a lot, but I haven't been since the shoulder. The doctor put a shot in it but it didn't help that much.    Hand dominance: Left  PERTINENT HISTORY: Osteoporosis, meniscus surgery 2012, compression fx lumbar 2013, compression fx T2 2020  PAIN:  Are you having pain? Yes: NPRS scale: varies depending on activity 7-8/10 Pain location: L shoulder  Pain description: I guess sharp  Aggravating factors: reaching overhead, lifting, carrying Relieving factors: ice  PRECAUTIONS: None  RED FLAGS: None   WEIGHT BEARING RESTRICTIONS: No  FALLS:  Has patient fallen in last 6 months? No  LIVING ENVIRONMENT: Lives with: lives with their spouse Lives in: House/apartment  OCCUPATION: Retired  PLOF: Independent  PATIENT GOALS: want it to quit hurting so I can do what I want to do   NEXT MD VISIT:   OBJECTIVE:  Note: Objective measures were completed at Evaluation unless otherwise noted.  DIAGNOSTIC FINDINGS: tried to do an MRI but was unable to get in the position   PATIENT SURVEYS:  Quick Dash 38.6  COGNITION: Overall cognitive status: Within functional limits for tasks assessed     SENSATION: WFL  POSTURE: Rounded shoulders  UPPER EXTREMITY ROM: grossly WFL, IR is the most difficult and painful at end range    UPPER EXTREMITY MMT:  MMT Right eval Left eval  Shoulder flexion 4- 2+  Shoulder  extension    Shoulder abduction 4- 2+  Shoulder adduction    Shoulder internal rotation 5 4  Shoulder external rotation 4- 2+ with pain  Middle trapezius    Lower trapezius    Elbow flexion    Elbow extension    Wrist flexion    Wrist extension    Wrist ulnar deviation    Wrist radial deviation    Wrist pronation    Wrist supination    Grip strength (lbs)    (Blank rows = not tested)  SHOULDER SPECIAL TESTS: Impingement tests: Neer impingement test: positive , Hawkins/Kennedy impingement test: positive , and Painful arc test: positive   JOINT MOBILITY TESTING:  Full PROM  PALPATION:  No TTP                                                                                                                              TREATMENT DATE: 11/23/23- EVAL, HEP    PATIENT EDUCATION: Education details: POC, HEP, RTC anatomy and physiology, shoulder impingement  Person educated: Patient Education method: Explanation Education comprehension: verbalized understanding and returned demonstration  HOME EXERCISE PROGRAM: Access Code: [email or web address] URL: [email or web address] Date: 11/23/2023 Prepared by: Cassie Freer  Exercises - Shoulder External Rotation with Anchored Resistance  - 1 x daily - 7 x weekly - 2 sets - 10 reps - Standing Shoulder Flexion with Resistance  - 1 x daily - 7 x weekly - 2 sets - 10 reps - Standing Single Arm Shoulder Abduction with Resistance  - 1 x daily - 7 x weekly - 2 sets - 10 reps - Shoulder External Rotation and Scapular Retraction with Resistance  - 1 x daily - 7 x weekly - 2 sets - 10 reps  ASSESSMENT:  CLINICAL IMPRESSION: Patient is a 78 y.o. female who was seen today for physical therapy evaluation and treatment for left shoulder pain with impingement syndrome and possible underlying rotator cuff tear. She is able to demonstrate full ROM with some slight end range pain. However, she is weak with MMT, especially with shoulder flexion,  abduction, and external rotation. Patient is very motivated and would like to be able to return to her gym routine and complete her daily tasks without L shoulder pain.    OBJECTIVE IMPAIRMENTS: decreased strength, impaired UE functional use, and pain.   ACTIVITY LIMITATIONS: carrying, lifting, and reach over head  PARTICIPATION LIMITATIONS: cleaning, laundry, and working out  [facility] POTENTIAL: Good  CLINICAL DECISION MAKING: Stable/uncomplicated  EVALUATION COMPLEXITY: Low   GOALS: Goals reviewed with patient? Yes  SHORT TERM GOALS: Target date: 12/28/23  Patient will be independent with initial HEP.  Baseline: given 11/23/23 Goal status: INITIAL   LONG TERM GOALS: Target date:02/01/24  Patient will be independent with  advanced/ongoing HEP to improve outcomes and carryover.  Baseline:  Goal status: INITIAL  2.  Patient will report 75% improvement in L shoulder pain to improve QOL.  Baseline: 7-8/10 Goal status: INITIAL  3.  Patient to be able to return to her normal gym routine without pain provocation. Baseline: stopped doing arms since pain started Goal status: INITIAL  4.  Patient will demonstrate improved functional UE strength as demonstrated by 4/5 or better. Baseline: 2+ with pain Goal status: INITIAL  5.  Patient will report 10 points improvement on QuickDash to demonstrate improved functional ability.  Baseline: 38.6  Goal status: INITIAL  PLAN:  PT FREQUENCY: 2x/week  PT DURATION: 10 weeks  PLANNED INTERVENTIONS: 97110-Therapeutic exercises, 97530- Therapeutic activity, [id number]- Neuromuscular re-education, 97535- Self Care, 16109- Manual therapy, [id number]- Electrical stimulation (unattended), 97035- Ultrasound, [id number]- Ionotophoresis 4mg /ml Dexamethasone, Patient/Family education, Taping, Dry Needling, Joint mobilization, Cryotherapy, and Moist heat  PLAN FOR NEXT SESSION: L shoulder strengthening as tolerated, pain modalities    Cassie Freer, PT 11/23/2023,  10:13 AM

## 2023-11-23 ENCOUNTER — Ambulatory Visit: Attending: Orthopedic Surgery

## 2023-11-23 DIAGNOSIS — M6281 Muscle weakness (generalized): Secondary | ICD-10-CM | POA: Diagnosis not present

## 2023-11-23 DIAGNOSIS — M7542 Impingement syndrome of left shoulder: Secondary | ICD-10-CM | POA: Diagnosis not present

## 2023-11-23 DIAGNOSIS — M25512 Pain in left shoulder: Secondary | ICD-10-CM | POA: Diagnosis not present

## 2023-11-30 ENCOUNTER — Ambulatory Visit: Admitting: Physical Therapy

## 2023-11-30 ENCOUNTER — Encounter: Payer: Self-pay | Admitting: Physical Therapy

## 2023-11-30 DIAGNOSIS — M6281 Muscle weakness (generalized): Secondary | ICD-10-CM | POA: Diagnosis not present

## 2023-11-30 DIAGNOSIS — M25512 Pain in left shoulder: Secondary | ICD-10-CM

## 2023-11-30 DIAGNOSIS — M7542 Impingement syndrome of left shoulder: Secondary | ICD-10-CM | POA: Diagnosis not present

## 2023-11-30 NOTE — Therapy (Signed)
 OUTPATIENT PHYSICAL THERAPY SHOULDER TREATMENT   Patient Name: Ann Vang MRN: [id number] DOB:07-15-1946, 78 y.o., female Today's Date: 11/30/2023  END OF SESSION:  PT End of Session - 11/30/23 1436     Visit Number 3    Date for PT Re-Evaluation 02/01/24    PT Start Time 1430    PT Stop Time 1515    PT Time Calculation (min) 45 min    Activity Tolerance Patient tolerated treatment well    Behavior During Therapy WFL for tasks assessed/performed             Past Medical History:  Diagnosis Date   Abnormal breast finding 12/1998   left breast abnl   Arthritis    --basal joint--right hand   Carpal tunnel syndrome, right    Compression fracture of T2 vertebra (HCC) 05/10/2019   Diverticulosis 2003   mild   Lumbar compression fracture (HCC) 07/13/2012   Lumbar disc disorder 2000   bulging   Melena    Menopause    Osteoporosis    Fx L4 age 74 water skiing accident   Palpitations    Thyroid disease 10/2014   thyroid nodules--bx'd large nodule and was benign--sees Gen. surgeon with [facility]   Past Surgical History:  Procedure Laterality Date   AUGMENTATION MAMMAPLASTY  1983   Implants have been removed   BREAST SURGERY  2010   implants removed ruptured   CERVICAL BIOPSY  W/ LOOP ELECTRODE EXCISION  1996   CIN   IR ANGIOGRAM SELECTIVE EACH ADDITIONAL VESSEL  03/10/2023   IR ANGIOGRAM SELECTIVE EACH ADDITIONAL VESSEL  03/10/2023   IR ANGIOGRAM SELECTIVE EACH ADDITIONAL VESSEL  03/10/2023   IR ANGIOGRAM SELECTIVE EACH ADDITIONAL VESSEL  05/11/2023   IR ANGIOGRAM SELECTIVE EACH ADDITIONAL VESSEL  05/11/2023   IR ANGIOGRAM SELECTIVE EACH ADDITIONAL VESSEL  05/11/2023   IR ANGIOGRAM VISCERAL SELECTIVE  05/11/2023   IR EMBO ART  VEN HEMORR LYMPH EXTRAV  INC GUIDE ROADMAPPING  03/10/2023   IR EMBO ART  VEN HEMORR LYMPH EXTRAV  INC GUIDE ROADMAPPING  05/11/2023   IR EMBO TUMOR ORGAN ISCHEMIA INFARCT INC GUIDE ROADMAPPING  05/13/2017   IR RADIOLOGIST EVAL & MGMT  04/15/2017    IR RADIOLOGIST EVAL & MGMT  05/27/2017   IR RADIOLOGIST EVAL & MGMT  01/21/2018   IR RADIOLOGIST EVAL & MGMT  09/28/2019   IR RADIOLOGIST EVAL & MGMT  10/07/2021   IR RADIOLOGIST EVAL & MGMT  03/31/2023   IR RADIOLOGIST EVAL & MGMT  06/09/2023   IR RENAL SELECTIVE  UNI INC S&I MOD SED  03/10/2023   IR RENAL SUPRASEL UNI S&I MOD SED  05/13/2017   IR RENAL SUPRASEL UNI S&I MOD SED  05/11/2023   IR RENAL SUPRASEL UNI S&I MOD SED  05/11/2023   IR US GUIDE VASC ACCESS LEFT  05/13/2017   IR US GUIDE VASC ACCESS RIGHT  03/10/2023   IR US GUIDE VASC ACCESS RIGHT  05/11/2023   KNEE SURGERY  2012   meniscus rt knee (both)   L-2 fracture  2009   fall in aerobics   L-4 fracture   water skiing     TENOSYNOVECTOMY Right 03/11/2016   Procedure: TENOSYNOVECTOMY FLEXOR RIGHT RING FINGER;  Surgeon: Cindee Salt, MD;  Location: [facility];  Service: Orthopedics;  Laterality: Right;   thyroid nodule biopsy  10/2014   --[facility], [place]--   TONSILLECTOMY AND ADENOIDECTOMY  1964   Patient Active Problem List   Diagnosis  Date Noted   Renal angiomyolipoma 05/11/2023   Hypokalemia 03/11/2023   Hyponatremia 03/11/2023   Hematoma 03/11/2023   Diverticulosis 03/10/2023   Renal hematoma, left 03/10/2023   Paroxysmal atrial fibrillation on Eliquis (HCC) 10/02/2020   Compression fracture of T2 vertebra (HCC) 05/10/2019   Left renal hematoma with extravasation s/p embolectomy 03/10/2023 12/22/2018   Low back pain 12/21/2018   Acute pain of right shoulder 11/19/2017   Angiomyolipoma of kidney 05/13/2017   Chronic pain of right knee 09/25/2016   Flexor tenosynovitis of finger 03/14/2016   Pain 02/20/2016   Thyroid nodule 05/15/2015   Lipoma of back 10/06/2014   Lumbar compression fracture (HCC) 07/13/2012   Ear pressure 07/13/2012   DYSFUNCTION OF EUSTACHIAN TUBE 07/30/2010   Disorder of bone and cartilage 03/15/2010    PCP: Alysia Penna  REFERRING PROVIDER: Francena Hanly  REFERRING DIAG:  Z61.096 (ICD-10-CM) - Pain in left shoulder   THERAPY DIAG:  Acute pain of left shoulder  Muscle weakness (generalized)  Impingement syndrome of left shoulder  Rationale for Evaluation and Treatment: Rehabilitation  ONSET DATE: 11/04/23  SUBJECTIVE:                                                                                                                                                                                      SUBJECTIVE STATEMENT:  Doing ok, pain with some movements   Probably more than 1 month ago, I was getting ready for bed and I has severe pain in my shoulder. I wasn't doing anything when it happened. I do exercise a lot, but I haven't been since the shoulder. The doctor put a shot in it but it didn't help that much.    Hand dominance: Left  PERTINENT HISTORY: Osteoporosis, meniscus surgery 2012, compression fx lumbar 2013, compression fx T2 2020  PAIN:  Are you having pain? Yes: NPRS scale: 4/10 Pain location: L shoulder  Pain description: I guess sharp  Aggravating factors: reaching overhead, lifting, carrying Relieving factors: ice  PRECAUTIONS: None  RED FLAGS: None   WEIGHT BEARING RESTRICTIONS: No  FALLS:  Has patient fallen in last 6 months? No  LIVING ENVIRONMENT: Lives with: lives with their spouse Lives in: House/apartment  OCCUPATION: Retired  PLOF: Independent  PATIENT GOALS: want it to quit hurting so I can do what I want to do   NEXT MD VISIT:   OBJECTIVE:  Note: Objective measures were completed at Evaluation unless otherwise noted.  DIAGNOSTIC FINDINGS: tried to do an MRI but was unable to get in the position   PATIENT SURVEYS:  Quick Dash 38.6  COGNITION: Overall cognitive status: Within functional limits for tasks assessed  SENSATION: WFL  POSTURE: Rounded shoulders  UPPER EXTREMITY ROM: grossly WFL, IR is the most difficult and painful at end range    UPPER EXTREMITY MMT:  MMT Right eval  Left eval  Shoulder flexion 4- 2+  Shoulder extension    Shoulder abduction 4- 2+  Shoulder adduction    Shoulder internal rotation 5 4  Shoulder external rotation 4- 2+ with pain  Middle trapezius    Lower trapezius    Elbow flexion    Elbow extension    Wrist flexion    Wrist extension    Wrist ulnar deviation    Wrist radial deviation    Wrist pronation    Wrist supination    Grip strength (lbs)    (Blank rows = not tested)  SHOULDER SPECIAL TESTS: Impingement tests: Neer impingement test: positive , Hawkins/Kennedy impingement test: positive , and Painful arc test: positive   JOINT MOBILITY TESTING:  Full PROM  PALPATION:  No TTP                                                                                                                              TREATMENT DATE:  11/30/23 AAROM shoulder Flex, Ext, IR with cane x10 UBE L1 x 2.5 min each  1lb flex and abd 2x10 LUE ER yellow 2x10 Went over shoulder impingement with use of shoulder model Shoulder IR red 2x10 Triceps extension red band 2x10  LUE PROM with end range holds  LUE distraction  11/23/23- EVAL, HEP    PATIENT EDUCATION: Education details: POC, HEP, RTC anatomy and physiology, shoulder impingement  Person educated: Patient Education method: Explanation Education comprehension: verbalized understanding and returned demonstration  HOME EXERCISE PROGRAM: Access Code: [email or web address] URL: [email or web address] Date: 11/23/2023 Prepared by: Cassie Freer  Exercises - Shoulder External Rotation with Anchored Resistance  - 1 x daily - 7 x weekly - 2 sets - 10 reps - Standing Shoulder Flexion with Resistance  - 1 x daily - 7 x weekly - 2 sets - 10 reps - Standing Single Arm Shoulder Abduction with Resistance  - 1 x daily - 7 x weekly - 2 sets - 10 reps - Shoulder External Rotation and Scapular Retraction with Resistance  - 1 x daily - 7 x weekly - 2 sets - 10 reps  ASSESSMENT:  CLINICAL  IMPRESSION: Patient is a 78 y.o. female who was seen today for physical therapy treatment for left shoulder pain with impingement syndrome and possible underlying rotator cuff tear. She enters doing well overall with some shoulder pain with activity. Shoulder flexion and external rotation did reveal a weak L rotator cuff. Some pain with motions above shoulder height. Patient remains very motivated and would like to be able to return to her gym routine and complete her daily tasks without L shoulder pain.    OBJECTIVE IMPAIRMENTS: decreased strength, impaired UE functional use, and pain.   ACTIVITY LIMITATIONS: carrying, lifting, and reach over head  PARTICIPATION LIMITATIONS: cleaning, laundry, and working out  [facility] POTENTIAL: Good  CLINICAL DECISION MAKING: Stable/uncomplicated  EVALUATION COMPLEXITY: Low   GOALS: Goals reviewed with patient? Yes  SHORT TERM GOALS: Target date: 12/28/23  Patient will be independent with initial HEP.  Baseline: given 11/23/23 Goal status: INITIAL   LONG TERM GOALS: Target date:02/01/24  Patient will be independent with advanced/ongoing HEP to improve outcomes and carryover.  Baseline:  Goal status: INITIAL  2.  Patient will report 75% improvement in L shoulder pain to improve QOL.  Baseline: 7-8/10 Goal status: INITIAL  3.  Patient to be able to return to her normal gym routine without pain provocation. Baseline: stopped doing arms since pain started Goal status: INITIAL  4.  Patient will demonstrate improved functional UE strength as demonstrated by 4/5 or better. Baseline: 2+ with pain Goal status: INITIAL  5.  Patient will report 10 points improvement on QuickDash to demonstrate improved functional ability.  Baseline: 38.6  Goal status: INITIAL  PLAN:  PT FREQUENCY: 2x/week  PT DURATION: 10 weeks  PLANNED INTERVENTIONS: 97110-Therapeutic exercises, 97530- Therapeutic activity, [id number]- Neuromuscular re-education, 97535- Self Care,  [id number]- Manual therapy, G0283- Electrical stimulation (unattended), 97035- Ultrasound, 14782- Ionotophoresis 4mg /ml Dexamethasone, Patient/Family education, Taping, Dry Needling, Joint mobilization, Cryotherapy, and Moist heat  PLAN FOR NEXT SESSION: L shoulder strengthening as tolerated, pain modalities    Ollen Beverage, PTA 11/30/2023, 2:36 PM

## 2023-12-01 DIAGNOSIS — D485 Neoplasm of uncertain behavior of skin: Secondary | ICD-10-CM | POA: Diagnosis not present

## 2023-12-01 DIAGNOSIS — L988 Other specified disorders of the skin and subcutaneous tissue: Secondary | ICD-10-CM | POA: Diagnosis not present

## 2023-12-02 ENCOUNTER — Encounter: Payer: Self-pay | Admitting: Physical Therapy

## 2023-12-02 ENCOUNTER — Ambulatory Visit: Admitting: Physical Therapy

## 2023-12-02 DIAGNOSIS — M7542 Impingement syndrome of left shoulder: Secondary | ICD-10-CM

## 2023-12-02 DIAGNOSIS — M25512 Pain in left shoulder: Secondary | ICD-10-CM

## 2023-12-02 DIAGNOSIS — M6281 Muscle weakness (generalized): Secondary | ICD-10-CM | POA: Diagnosis not present

## 2023-12-02 NOTE — Therapy (Signed)
 OUTPATIENT PHYSICAL THERAPY SHOULDER TREATMENT   Patient Name: Ann Vang MRN: [id number] DOB:Mar 30, 1946, 78 y.o., female Today's Date: 12/02/2023  END OF SESSION:  PT End of Session - 12/02/23 1555     Visit Number 3    Date for PT Re-Evaluation 02/01/24    Authorization Type [employer]    PT Start Time 1600    PT Stop Time 1645    PT Time Calculation (min) 45 min             Past Medical History:  Diagnosis Date   Abnormal breast finding 12/1998   left breast abnl   Arthritis    --basal joint--right hand   Carpal tunnel syndrome, right    Compression fracture of T2 vertebra (HCC) 05/10/2019   Diverticulosis 2003   mild   Lumbar compression fracture (HCC) 07/13/2012   Lumbar disc disorder 2000   bulging   Melena    Menopause    Osteoporosis    Fx L4 age 29 water skiing accident   Palpitations    Thyroid disease 10/2014   thyroid nodules--bx'd large nodule and was benign--sees Gen. Careers adviser with [facility]   Past Surgical History:  Procedure Laterality Date   AUGMENTATION MAMMAPLASTY  1983   Implants have been removed   BREAST SURGERY  2010   implants removed ruptured   CERVICAL BIOPSY  W/ LOOP ELECTRODE EXCISION  1996   CIN   IR ANGIOGRAM SELECTIVE EACH ADDITIONAL VESSEL  03/10/2023   IR ANGIOGRAM SELECTIVE EACH ADDITIONAL VESSEL  03/10/2023   IR ANGIOGRAM SELECTIVE EACH ADDITIONAL VESSEL  03/10/2023   IR ANGIOGRAM SELECTIVE EACH ADDITIONAL VESSEL  05/11/2023   IR ANGIOGRAM SELECTIVE EACH ADDITIONAL VESSEL  05/11/2023   IR ANGIOGRAM SELECTIVE EACH ADDITIONAL VESSEL  05/11/2023   IR ANGIOGRAM VISCERAL SELECTIVE  05/11/2023   IR EMBO ART  VEN HEMORR LYMPH EXTRAV  INC GUIDE ROADMAPPING  03/10/2023   IR EMBO ART  VEN HEMORR LYMPH EXTRAV  INC GUIDE ROADMAPPING  05/11/2023   IR EMBO TUMOR ORGAN ISCHEMIA INFARCT INC GUIDE ROADMAPPING  05/13/2017   IR RADIOLOGIST EVAL & MGMT  04/15/2017   IR RADIOLOGIST EVAL & MGMT  05/27/2017   IR RADIOLOGIST EVAL & MGMT  01/21/2018   IR  RADIOLOGIST EVAL & MGMT  09/28/2019   IR RADIOLOGIST EVAL & MGMT  10/07/2021   IR RADIOLOGIST EVAL & MGMT  03/31/2023   IR RADIOLOGIST EVAL & MGMT  06/09/2023   IR RENAL SELECTIVE  UNI INC S&I MOD SED  03/10/2023   IR RENAL SUPRASEL UNI S&I MOD SED  05/13/2017   IR RENAL SUPRASEL UNI S&I MOD SED  05/11/2023   IR RENAL SUPRASEL UNI S&I MOD SED  05/11/2023   IR US GUIDE VASC ACCESS LEFT  05/13/2017   IR US GUIDE VASC ACCESS RIGHT  03/10/2023   IR US GUIDE VASC ACCESS RIGHT  05/11/2023   KNEE SURGERY  2012   meniscus rt knee (both)   L-2 fracture  2009   fall in aerobics   L-4 fracture   water skiing     TENOSYNOVECTOMY Right 03/11/2016   Procedure: TENOSYNOVECTOMY FLEXOR RIGHT RING FINGER;  Surgeon: Cindee Salt, MD;  Location:  [facility];  Service: Orthopedics;  Laterality: Right;   thyroid nodule biopsy  10/2014   --[facility], [place]--   TONSILLECTOMY AND ADENOIDECTOMY  1964   Patient Active Problem List   Diagnosis Date Noted   Renal angiomyolipoma 05/11/2023   Hypokalemia 03/11/2023  Hyponatremia 03/11/2023   Hematoma 03/11/2023   Diverticulosis 03/10/2023   Renal hematoma, left 03/10/2023   Paroxysmal atrial fibrillation on Eliquis (HCC) 10/02/2020   Compression fracture of T2 vertebra (HCC) 05/10/2019   Left renal hematoma with extravasation s/p embolectomy 03/10/2023 12/22/2018   Low back pain 12/21/2018   Acute pain of right shoulder 11/19/2017   Angiomyolipoma of kidney 05/13/2017   Chronic pain of right knee 09/25/2016   Flexor tenosynovitis of finger 03/14/2016   Pain 02/20/2016   Thyroid nodule 05/15/2015   Lipoma of back 10/06/2014   Lumbar compression fracture (HCC) 07/13/2012   Ear pressure 07/13/2012   DYSFUNCTION OF EUSTACHIAN TUBE 07/30/2010   Disorder of bone and cartilage 03/15/2010    PCP: Barnetta Liberty  REFERRING PROVIDER: Ellard Gunning  REFERRING DIAG: Z61.096 (ICD-10-CM) - Pain in left shoulder   THERAPY DIAG:  Acute pain of left  shoulder  Muscle weakness (generalized)  Impingement syndrome of left shoulder  Rationale for Evaluation and Treatment: Rehabilitation  ONSET DATE: 11/04/23  SUBJECTIVE:                                                                                                                                                                                      SUBJECTIVE STATEMENT:  I feel ok, shoulder feels the same. Certain positions cause pain. This is preventing her from fully doing her HEP.   Hand dominance: Left  PERTINENT HISTORY: Osteoporosis, meniscus surgery 2012, compression fx lumbar 2013, compression fx T2 2020  PAIN:  Are you having pain? Yes: NPRS scale: 4/10 Pain location: L shoulder  Pain description: I guess sharp  Aggravating factors: reaching overhead, lifting, carrying Relieving factors: ice  PRECAUTIONS: None  RED FLAGS: None   WEIGHT BEARING RESTRICTIONS: No  FALLS:  Has patient fallen in last 6 months? No  LIVING ENVIRONMENT: Lives with: lives with their spouse Lives in: House/apartment  OCCUPATION: Retired  PLOF: Independent  PATIENT GOALS: want it to quit hurting so I can do what I want to do   NEXT MD VISIT:   OBJECTIVE:  Note: Objective measures were completed at Evaluation unless otherwise noted.  DIAGNOSTIC FINDINGS: tried to do an MRI but was unable to get in the position   PATIENT SURVEYS:  Quick Dash 38.6  COGNITION: Overall cognitive status: Within functional limits for tasks assessed     SENSATION: WFL  POSTURE: Rounded shoulders  UPPER EXTREMITY ROM: grossly WFL, IR is the most difficult and painful at end range    UPPER EXTREMITY MMT:  MMT Right eval Left eval  Shoulder flexion 4- 2+  Shoulder extension    Shoulder abduction 4- 2+  Shoulder  adduction    Shoulder internal rotation 5 4  Shoulder external rotation 4- 2+ with pain  Middle trapezius    Lower trapezius    Elbow flexion    Elbow extension    Wrist  flexion    Wrist extension    Wrist ulnar deviation    Wrist radial deviation    Wrist pronation    Wrist supination    Grip strength (lbs)    (Blank rows = not tested)  SHOULDER SPECIAL TESTS: Impingement tests: Neer impingement test: positive , Hawkins/Kennedy impingement test: positive , and Painful arc test: positive   JOINT MOBILITY TESTING:  Full PROM  PALPATION:  No TTP                                                                                                                              TREATMENT DATE:  12/02/23 UBE L1 each way PROM Lt shouler all motions - pain with ER and abd past 90 Red band ER 2x10 1 # shoulder flex  2x10 -pain increased approaching 90 Ball rolls on the wall  Flexion  up and down, circles  Abduct up and donn, circles L Tricep ext 5# 2x10  Shoulder AROM  from 90 abduction flex x10 Abd x10 ER x10  11/30/23 AAROM shoulder Flex, Ext, IR with cane x10 UBE L1 x 2.5 min each  1lb flex and abd 2x10 LUE ER yellow 2x10 Went over shoulder impingement with use of shoulder model Shoulder IR red 2x10 Triceps extension red band 2x10  LUE PROM with end range holds  LUE distraction  11/23/23- EVAL, HEP    PATIENT EDUCATION: Education details: POC, HEP, RTC anatomy and physiology, shoulder impingement  Person educated: Patient Education method: Explanation Education comprehension: verbalized understanding and returned demonstration  HOME EXERCISE PROGRAM: Access Code: [email or web address] URL: [email or web address] Date: 11/23/2023 Prepared by: Cassie Freer  Exercises - Shoulder External Rotation with Anchored Resistance  - 1 x daily - 7 x weekly - 2 sets - 10 reps - Standing Shoulder Flexion with Resistance  - 1 x daily - 7 x weekly - 2 sets - 10 reps - Standing Single Arm Shoulder Abduction with Resistance  - 1 x daily - 7 x weekly - 2 sets - 10 reps - Shoulder External Rotation and Scapular Retraction with Resistance  - 1 x daily -  7 x weekly - 2 sets - 10 reps  ASSESSMENT:  CLINICAL IMPRESSION: Patient arrived with mild shoulder pain that worsened in certain positions. Her pain increased with flexion, abduction , and ER. She tolerated PROM well, but was guarded and needed constant cues to relax her shoulder. Time spent answering questions she had  about her HEP and was encouraged to perform her exercises through the pain, Pt verbalized understanding   OBJECTIVE IMPAIRMENTS: decreased strength, impaired UE functional use, and pain.   ACTIVITY LIMITATIONS: carrying, lifting, and reach over head  PARTICIPATION LIMITATIONS: cleaning, laundry, and working  out  REHAB POTENTIAL: Good  CLINICAL DECISION MAKING: Stable/uncomplicated  EVALUATION COMPLEXITY: Low   GOALS: Goals reviewed with patient? Yes  SHORT TERM GOALS: Target date: 12/28/23  Patient will be independent with initial HEP.  Baseline: given 11/23/23 Goal status: INITIAL   LONG TERM GOALS: Target date:02/01/24  Patient will be independent with advanced/ongoing HEP to improve outcomes and carryover.  Baseline:  Goal status: INITIAL  2.  Patient will report 75% improvement in L shoulder pain to improve QOL.  Baseline: 7-8/10 Goal status: INITIAL  3.  Patient to be able to return to her normal gym routine without pain provocation. Baseline: stopped doing arms since pain started Goal status: INITIAL  4.  Patient will demonstrate improved functional UE strength as demonstrated by 4/5 or better. Baseline: 2+ with pain Goal status: INITIAL  5.  Patient will report 10 points improvement on QuickDash to demonstrate improved functional ability.  Baseline: 38.6  Goal status: INITIAL  PLAN:  PT FREQUENCY: 2x/week  PT DURATION: 10 weeks  PLANNED INTERVENTIONS: 97110-Therapeutic exercises, 97530- Therapeutic activity, 97112- Neuromuscular re-education, 97535- Self Care, 96045- Manual therapy, G0283- Electrical stimulation (unattended), 97035-  Ultrasound, 40981- Ionotophoresis 4mg /ml Dexamethasone, Patient/Family education, Taping, Dry Needling, Joint mobilization, Cryotherapy, and Moist heat  PLAN FOR NEXT SESSION: L shoulder strengthening as tolerated, pain modalities    Laurelyn Ponder 12/02/2023, 3:56 PM [facility]. [place], [place], [address] Phone: [phone]   Fax:  [phone]  Patient Details  Name: Ann Vang MRN: [id number] Date of Birth: [birth year] Referring Provider:  Ellard Gunning, MD  Encounter Date: 12/02/2023   Laurelyn Ponder 12/02/2023, 3:56 PM  [place] [facility]. [place], [place], [address] Phone: [phone]   Fax:  [phone]

## 2023-12-08 NOTE — Therapy (Signed)
 OUTPATIENT PHYSICAL THERAPY SHOULDER TREATMENT   Patient Name: Ann Vang MRN: [id number] DOB:11-21-1945, 78 y.o., female Today's Date: 12/09/2023  END OF SESSION:  PT End of Session - 12/09/23 0847     Visit Number 4    Date for PT Re-Evaluation 02/01/24    Authorization Type BCBS    PT Start Time 0845    PT Stop Time 0930    PT Time Calculation (min) 45 min              Past Medical History:  Diagnosis Date   Abnormal breast finding 12/1998   left breast abnl   Arthritis    --basal joint--right hand   Carpal tunnel syndrome, right    Compression fracture of T2 vertebra (HCC) 05/10/2019   Diverticulosis 2003   mild   Lumbar compression fracture (HCC) 07/13/2012   Lumbar disc disorder 2000   bulging   Melena    Menopause    Osteoporosis    Fx L4 age 58 water skiing accident   Palpitations    Thyroid  disease 10/2014   thyroid  nodules--bx'd large nodule and was benign--sees Gen. surgeon with [facility]   Past Surgical History:  Procedure Laterality Date   AUGMENTATION MAMMAPLASTY  1983   Implants have been removed   BREAST SURGERY  2010   implants removed ruptured   CERVICAL BIOPSY  W/ LOOP ELECTRODE EXCISION  1996   CIN   IR ANGIOGRAM SELECTIVE EACH ADDITIONAL VESSEL  03/10/2023   IR ANGIOGRAM SELECTIVE EACH ADDITIONAL VESSEL  03/10/2023   IR ANGIOGRAM SELECTIVE EACH ADDITIONAL VESSEL  03/10/2023   IR ANGIOGRAM SELECTIVE EACH ADDITIONAL VESSEL  05/11/2023   IR ANGIOGRAM SELECTIVE EACH ADDITIONAL VESSEL  05/11/2023   IR ANGIOGRAM SELECTIVE EACH ADDITIONAL VESSEL  05/11/2023   IR ANGIOGRAM VISCERAL SELECTIVE  05/11/2023   IR EMBO ART  VEN HEMORR LYMPH EXTRAV  INC GUIDE ROADMAPPING  03/10/2023   IR EMBO ART  VEN HEMORR LYMPH EXTRAV  INC GUIDE ROADMAPPING  05/11/2023   IR EMBO TUMOR ORGAN ISCHEMIA INFARCT INC GUIDE ROADMAPPING  05/13/2017   IR RADIOLOGIST EVAL & MGMT  04/15/2017   IR RADIOLOGIST EVAL & MGMT  05/27/2017   IR RADIOLOGIST EVAL & MGMT  01/21/2018    IR RADIOLOGIST EVAL & MGMT  09/28/2019   IR RADIOLOGIST EVAL & MGMT  10/07/2021   IR RADIOLOGIST EVAL & MGMT  03/31/2023   IR RADIOLOGIST EVAL & MGMT  06/09/2023   IR RENAL SELECTIVE  UNI INC S&I MOD SED  03/10/2023   IR RENAL SUPRASEL UNI S&I MOD SED  05/13/2017   IR RENAL SUPRASEL UNI S&I MOD SED  05/11/2023   IR RENAL SUPRASEL UNI S&I MOD SED  05/11/2023   IR US  GUIDE VASC ACCESS LEFT  05/13/2017   IR US  GUIDE VASC ACCESS RIGHT  03/10/2023   IR US  GUIDE VASC ACCESS RIGHT  05/11/2023   KNEE SURGERY  2012   meniscus rt knee (both)   L-2 fracture  2009   fall in aerobics   L-4 fracture   water skiing     TENOSYNOVECTOMY Right 03/11/2016   Procedure: TENOSYNOVECTOMY FLEXOR RIGHT RING FINGER;  Surgeon: Lyanne Sample, MD;  Location: [facility];  Service: Orthopedics;  Laterality: Right;   thyroid  nodule biopsy  10/2014   --[facility], [place]--   TONSILLECTOMY AND ADENOIDECTOMY  1964   Patient Active Problem List   Diagnosis Date Noted   Renal angiomyolipoma 05/11/2023   Hypokalemia 03/11/2023  Hyponatremia 03/11/2023   Hematoma 03/11/2023   Diverticulosis 03/10/2023   Renal hematoma, left 03/10/2023   Paroxysmal atrial fibrillation on Eliquis  (HCC) 10/02/2020   Compression fracture of T2 vertebra (HCC) 05/10/2019   Left renal hematoma with extravasation s/p embolectomy 03/10/2023 12/22/2018   Low back pain 12/21/2018   Acute pain of right shoulder 11/19/2017   Angiomyolipoma of kidney 05/13/2017   Chronic pain of right knee 09/25/2016   Flexor tenosynovitis of finger 03/14/2016   Pain 02/20/2016   Thyroid  nodule 05/15/2015   Lipoma of back 10/06/2014   Lumbar compression fracture (HCC) 07/13/2012   Ear pressure 07/13/2012   DYSFUNCTION OF EUSTACHIAN TUBE 07/30/2010   Disorder of bone and cartilage 03/15/2010    PCP: Barnetta Liberty  REFERRING PROVIDER: Ellard Gunning  REFERRING DIAG: Z61.096 (ICD-10-CM) - Pain in left shoulder   THERAPY DIAG:  Acute pain of left  shoulder  Muscle weakness (generalized)  Impingement syndrome of left shoulder  Rationale for Evaluation and Treatment: Rehabilitation  ONSET DATE: 11/04/23  SUBJECTIVE:                                                                                                                                                                                      SUBJECTIVE STATEMENT: Good, I am going to get an MRI next Tuesday so we can find out what is wrong. L shoulder is still hurting, but it depends on what I am doing.   Hand dominance: Left  PERTINENT HISTORY: Osteoporosis, meniscus surgery 2012, compression fx lumbar 2013, compression fx T2 2020  PAIN:  Are you having pain? Yes: NPRS scale: 4/10 Pain location: L shoulder  Pain description: I guess sharp  Aggravating factors: reaching overhead, lifting, carrying Relieving factors: ice  PRECAUTIONS: None  RED FLAGS: None   WEIGHT BEARING RESTRICTIONS: No  FALLS:  Has patient fallen in last 6 months? No  LIVING ENVIRONMENT: Lives with: lives with their spouse Lives in: House/apartment  OCCUPATION: Retired  PLOF: Independent  PATIENT GOALS: want it to quit hurting so I can do what I want to do   NEXT MD VISIT:   OBJECTIVE:  Note: Objective measures were completed at Evaluation unless otherwise noted.  DIAGNOSTIC FINDINGS: tried to do an MRI but was unable to get in the position   PATIENT SURVEYS:  Quick Dash 38.6  COGNITION: Overall cognitive status: Within functional limits for tasks assessed     SENSATION: WFL  POSTURE: Rounded shoulders  UPPER EXTREMITY ROM: grossly WFL, IR is the most difficult and painful at end range    UPPER EXTREMITY MMT:  MMT Right eval Left eval  Shoulder flexion 4- 2+  Shoulder  extension    Shoulder abduction 4- 2+  Shoulder adduction    Shoulder internal rotation 5 4  Shoulder external rotation 4- 2+ with pain  Middle trapezius    Lower trapezius    Elbow flexion     Elbow extension    Wrist flexion    Wrist extension    Wrist ulnar deviation    Wrist radial deviation    Wrist pronation    Wrist supination    Grip strength (lbs)    (Blank rows = not tested)  SHOULDER SPECIAL TESTS: Impingement tests: Neer impingement test: positive , Hawkins/Kennedy impingement test: positive , and Painful arc test: positive   JOINT MOBILITY TESTING:  Full PROM  PALPATION:  No TTP                                                                                                                              TREATMENT DATE:  12/09/23 UBE L2 x62mins each way AAROM with dowel all directions x10 2# wall slide flexion 2x10  Ball against wall ER/IR isometric x10 Ball on wall 4 way rolls x10 Bicep curls 10# 2x10 Tricep ext 25# 2x10  1# abduction 2x10 1# shoulder flexion 2x10   12/02/23 UBE L1 each way PROM Lt shouler all motions - pain with ER and abd past 90 Red band ER 2x10 1 # shoulder flex  2x10 -pain increased approaching 90 Ball rolls on the wall  Flexion  up and down, circles  Abduct up and donn, circles L Tricep ext 5# 2x10  Shoulder AROM  from 90 abduction flex x10 Abd x10 ER x10  11/30/23 AAROM shoulder Flex, Ext, IR with cane x10 UBE L1 x 2.5 min each  1lb flex and abd 2x10 LUE ER yellow 2x10 Went over shoulder impingement with use of shoulder model Shoulder IR red 2x10 Triceps extension red band 2x10  LUE PROM with end range holds  LUE distraction  11/23/23- EVAL, HEP    PATIENT EDUCATION: Education details: POC, HEP, RTC anatomy and physiology, shoulder impingement  Person educated: Patient Education method: Explanation Education comprehension: verbalized understanding and returned demonstration  HOME EXERCISE PROGRAM: Access Code: [email or web address] URL: [email or web address] Date: 11/23/2023 Prepared by: Donavon Fudge  Exercises - Shoulder External Rotation with Anchored Resistance  - 1 x daily - 7 x weekly - 2 sets  - 10 reps - Standing Shoulder Flexion with Resistance  - 1 x daily - 7 x weekly - 2 sets - 10 reps - Standing Single Arm Shoulder Abduction with Resistance  - 1 x daily - 7 x weekly - 2 sets - 10 reps - Shoulder External Rotation and Scapular Retraction with Resistance  - 1 x daily - 7 x weekly - 2 sets - 10 reps  ASSESSMENT:  CLINICAL IMPRESSION: Patient arrived with mild shoulder pain that she states gets worse at random times. Her pain still  increases with flexion, abduction, and ER. Able to do 1#  flexion and abd to ~90d with less pain, she reports it is easier to do than with the yellow band she's using for HEP. She has an MRI scheduled for Tuesday to rule in/out rotator cuff pathology and/or impingement.    OBJECTIVE IMPAIRMENTS: decreased strength, impaired UE functional use, and pain.   ACTIVITY LIMITATIONS: carrying, lifting, and reach over head  PARTICIPATION LIMITATIONS: cleaning, laundry, and working out  [facility] POTENTIAL: Good  CLINICAL DECISION MAKING: Stable/uncomplicated  EVALUATION COMPLEXITY: Low   GOALS: Goals reviewed with patient? Yes  SHORT TERM GOALS: Target date: 12/28/23  Patient will be independent with initial HEP.  Baseline: given 11/23/23 Goal status: INITIAL   LONG TERM GOALS: Target date:02/01/24  Patient will be independent with advanced/ongoing HEP to improve outcomes and carryover.  Baseline:  Goal status: INITIAL  2.  Patient will report 75% improvement in L shoulder pain to improve QOL.  Baseline: 7-8/10 Goal status: INITIAL  3.  Patient to be able to return to her normal gym routine without pain provocation. Baseline: stopped doing arms since pain started Goal status: INITIAL  4.  Patient will demonstrate improved functional UE strength as demonstrated by 4/5 or better. Baseline: 2+ with pain Goal status: INITIAL  5.  Patient will report 10 points improvement on QuickDash to demonstrate improved functional ability.  Baseline: 38.6   Goal status: INITIAL  PLAN:  PT FREQUENCY: 2x/week  PT DURATION: 10 weeks  PLANNED INTERVENTIONS: 97110-Therapeutic exercises, 97530- Therapeutic activity, 97112- Neuromuscular re-education, 97535- Self Care, 16109- Manual therapy, G0283- Electrical stimulation (unattended), 97035- Ultrasound, 60454- Ionotophoresis 4mg /ml Dexamethasone, Patient/Family education, Taping, Dry Needling, Joint mobilization, Cryotherapy, and Moist heat  PLAN FOR NEXT SESSION: L shoulder strengthening as tolerated, pain modalities    Donavon Fudge, PT 12/09/2023, 9:31 AM [facility] at [facility] [address] [facility] [place], [place], [address] Phone: [phone]   Fax:  [phone]  Patient Details  Name: Ann Vang MRN: [id number] Date of Birth: Nov 20, 1945 Referring Provider:  Ellard Gunning, MD  Encounter Date: 12/09/2023   Donavon Fudge, PT 12/09/2023, 9:31 AM  [place] [facility] at [facility] [address] [place], [place], [address] Phone: [phone]   Fax:  [phone]

## 2023-12-09 ENCOUNTER — Ambulatory Visit

## 2023-12-09 DIAGNOSIS — M6281 Muscle weakness (generalized): Secondary | ICD-10-CM | POA: Diagnosis not present

## 2023-12-09 DIAGNOSIS — M7542 Impingement syndrome of left shoulder: Secondary | ICD-10-CM

## 2023-12-09 DIAGNOSIS — M25512 Pain in left shoulder: Secondary | ICD-10-CM | POA: Diagnosis not present

## 2023-12-11 ENCOUNTER — Ambulatory Visit: Admitting: Physical Therapy

## 2023-12-11 ENCOUNTER — Encounter: Payer: Self-pay | Admitting: Physical Therapy

## 2023-12-11 DIAGNOSIS — M7542 Impingement syndrome of left shoulder: Secondary | ICD-10-CM | POA: Diagnosis not present

## 2023-12-11 DIAGNOSIS — M25512 Pain in left shoulder: Secondary | ICD-10-CM

## 2023-12-11 DIAGNOSIS — M6281 Muscle weakness (generalized): Secondary | ICD-10-CM

## 2023-12-11 NOTE — Therapy (Signed)
 OUTPATIENT PHYSICAL THERAPY SHOULDER TREATMENT   Patient Name: Ann Vang MRN: [id number] DOB:08-02-1946, 78 y.o., female Today's Date: 12/11/2023  END OF SESSION:  PT End of Session - 12/11/23 0839     Visit Number 5    Date for PT Re-Evaluation 02/01/24    Authorization Type BCBS    PT Start Time 0845    PT Stop Time 0930    PT Time Calculation (min) 45 min              Past Medical History:  Diagnosis Date   Abnormal breast finding 12/1998   left breast abnl   Arthritis    --basal joint--right hand   Carpal tunnel syndrome, right    Compression fracture of T2 vertebra (HCC) 05/10/2019   Diverticulosis 2003   mild   Lumbar compression fracture (HCC) 07/13/2012   Lumbar disc disorder 2000   bulging   Melena    Menopause    Osteoporosis    Fx L4 age 57 water skiing accident   Palpitations    Thyroid  disease 10/2014   thyroid  nodules--bx'd large nodule and was benign--sees Gen. surgeon with [facility]   Past Surgical History:  Procedure Laterality Date   AUGMENTATION MAMMAPLASTY  1983   Implants have been removed   BREAST SURGERY  2010   implants removed ruptured   CERVICAL BIOPSY  W/ LOOP ELECTRODE EXCISION  1996   CIN   IR ANGIOGRAM SELECTIVE EACH ADDITIONAL VESSEL  03/10/2023   IR ANGIOGRAM SELECTIVE EACH ADDITIONAL VESSEL  03/10/2023   IR ANGIOGRAM SELECTIVE EACH ADDITIONAL VESSEL  03/10/2023   IR ANGIOGRAM SELECTIVE EACH ADDITIONAL VESSEL  05/11/2023   IR ANGIOGRAM SELECTIVE EACH ADDITIONAL VESSEL  05/11/2023   IR ANGIOGRAM SELECTIVE EACH ADDITIONAL VESSEL  05/11/2023   IR ANGIOGRAM VISCERAL SELECTIVE  05/11/2023   IR EMBO ART  VEN HEMORR LYMPH EXTRAV  INC GUIDE ROADMAPPING  03/10/2023   IR EMBO ART  VEN HEMORR LYMPH EXTRAV  INC GUIDE ROADMAPPING  05/11/2023   IR EMBO TUMOR ORGAN ISCHEMIA INFARCT INC GUIDE ROADMAPPING  05/13/2017   IR RADIOLOGIST EVAL & MGMT  04/15/2017   IR RADIOLOGIST EVAL & MGMT  05/27/2017   IR RADIOLOGIST EVAL & MGMT  01/21/2018    IR RADIOLOGIST EVAL & MGMT  09/28/2019   IR RADIOLOGIST EVAL & MGMT  10/07/2021   IR RADIOLOGIST EVAL & MGMT  03/31/2023   IR RADIOLOGIST EVAL & MGMT  06/09/2023   IR RENAL SELECTIVE  UNI INC S&I MOD SED  03/10/2023   IR RENAL SUPRASEL UNI S&I MOD SED  05/13/2017   IR RENAL SUPRASEL UNI S&I MOD SED  05/11/2023   IR RENAL SUPRASEL UNI S&I MOD SED  05/11/2023   IR US  GUIDE VASC ACCESS LEFT  05/13/2017   IR US  GUIDE VASC ACCESS RIGHT  03/10/2023   IR US  GUIDE VASC ACCESS RIGHT  05/11/2023   KNEE SURGERY  2012   meniscus rt knee (both)   L-2 fracture  2009   fall in aerobics   L-4 fracture   water skiing     TENOSYNOVECTOMY Right 03/11/2016   Procedure: TENOSYNOVECTOMY FLEXOR RIGHT RING FINGER;  Surgeon: Lyanne Sample, MD;  Location: [facility];  Service: Orthopedics;  Laterality: Right;   thyroid  nodule biopsy  10/2014   --[facility], [place]--   TONSILLECTOMY AND ADENOIDECTOMY  1964   Patient Active Problem List   Diagnosis Date Noted   Renal angiomyolipoma 05/11/2023   Hypokalemia 03/11/2023  Hyponatremia 03/11/2023   Hematoma 03/11/2023   Diverticulosis 03/10/2023   Renal hematoma, left 03/10/2023   Paroxysmal atrial fibrillation on Eliquis  (HCC) 10/02/2020   Compression fracture of T2 vertebra (HCC) 05/10/2019   Left renal hematoma with extravasation s/p embolectomy 03/10/2023 12/22/2018   Low back pain 12/21/2018   Acute pain of right shoulder 11/19/2017   Angiomyolipoma of kidney 05/13/2017   Chronic pain of right knee 09/25/2016   Flexor tenosynovitis of finger 03/14/2016   Pain 02/20/2016   Thyroid  nodule 05/15/2015   Lipoma of back 10/06/2014   Lumbar compression fracture (HCC) 07/13/2012   Ear pressure 07/13/2012   DYSFUNCTION OF EUSTACHIAN TUBE 07/30/2010   Disorder of bone and cartilage 03/15/2010    PCP: Barnetta Liberty  REFERRING PROVIDER: Ellard Gunning  REFERRING DIAG: J81.191 (ICD-10-CM) - Pain in left shoulder   THERAPY DIAG:  Acute pain of left  shoulder  Muscle weakness (generalized)  Impingement syndrome of left shoulder  Rationale for Evaluation and Treatment: Rehabilitation  ONSET DATE: 11/04/23  SUBJECTIVE:                                                                                                                                                                                      SUBJECTIVE STATEMENT:  Shoulder is feeling good. She is getting an MRI Tuesday.   Hand dominance: Left  PERTINENT HISTORY: Osteoporosis, meniscus surgery 2012, compression fx lumbar 2013, compression fx T2 2020  PAIN:  Are you having pain? Yes: NPRS scale: 0/10 Pain location: L shoulder  Pain description: I guess sharp  Aggravating factors: reaching overhead, lifting, carrying Relieving factors: ice  PRECAUTIONS: None  RED FLAGS: None   WEIGHT BEARING RESTRICTIONS: No  FALLS:  Has patient fallen in last 6 months? No  LIVING ENVIRONMENT: Lives with: lives with their spouse Lives in: House/apartment  OCCUPATION: Retired  PLOF: Independent  PATIENT GOALS: want it to quit hurting so I can do what I want to do   NEXT MD VISIT:   OBJECTIVE:  Note: Objective measures were completed at Evaluation unless otherwise noted.  DIAGNOSTIC FINDINGS: tried to do an MRI but was unable to get in the position   PATIENT SURVEYS:  Quick Dash 38.6  COGNITION: Overall cognitive status: Within functional limits for tasks assessed     SENSATION: WFL  POSTURE: Rounded shoulders  UPPER EXTREMITY ROM: grossly WFL, IR is the most difficult and painful at end range    UPPER EXTREMITY MMT:  MMT Right eval Left eval  Shoulder flexion 4- 2+  Shoulder extension    Shoulder abduction 4- 2+  Shoulder adduction    Shoulder internal rotation 5 4  Shoulder external rotation 4- 2+ with pain  Middle trapezius    Lower trapezius    Elbow flexion    Elbow extension    Wrist flexion    Wrist extension    Wrist ulnar deviation     Wrist radial deviation    Wrist pronation    Wrist supination    Grip strength (lbs)    (Blank rows = not tested)  SHOULDER SPECIAL TESTS: Impingement tests: Neer impingement test: positive , Hawkins/Kennedy impingement test: positive , and Painful arc test: positive   JOINT MOBILITY TESTING:  Full PROM  PALPATION:  No TTP                                                                                                                              TREATMENT DATE: 12/09/23 UBE L2 x49min each way AAROM with dowel all direction- pain with flexion 1# flex and abd x10 each Red band ER x 10, manual resistance x10 ER with 90* abd 2# chest press 2x10- cue to unshrug shoulders Cable row and ext 2x12 each 10# Red band pull apart    Ball on wall roll flex, ext x10 each 2# wall slides flex and abd x10 each - increased pain, but was able to complete PROM with end range holds- all directions        12/09/23 UBE L2 x34mins each way AAROM with dowel all directions x10 2# wall slide flexion 2x10  Ball against wall ER/IR isometric x10 Ball on wall 4 way rolls x10 Bicep curls 10# 2x10 Tricep ext 25# 2x10  1# abduction 2x10 1# shoulder flexion 2x10   12/02/23 UBE L1 each way PROM Lt shouler all motions - pain with ER and abd past 90 Red band ER 2x10 1 # shoulder flex  2x10 -pain increased approaching 90 Ball rolls on the wall  Flexion  up and down, circles  Abduct up and donn, circles L Tricep ext 5# 2x10  Shoulder AROM  from 90 abduction flex x10 Abd x10 ER x10  11/30/23 AAROM shoulder Flex, Ext, IR with cane x10 UBE L1 x 2.5 min each  1lb flex and abd 2x10 LUE ER yellow 2x10 Went over shoulder impingement with use of shoulder model Shoulder IR red 2x10 Triceps extension red band 2x10  LUE PROM with end range holds  LUE distraction  11/23/23- EVAL, HEP    PATIENT EDUCATION: Education details: POC, HEP, RTC anatomy and physiology, shoulder impingement  Person  educated: Patient Education method: Explanation Education comprehension: verbalized understanding and returned demonstration  HOME EXERCISE PROGRAM: Access Code: [email or web address] URL: [email or web address] Date: 11/23/2023 Prepared by: Donavon Fudge  Exercises - Shoulder External Rotation with Anchored Resistance  - 1 x daily - 7 x weekly - 2 sets - 10 reps - Standing Shoulder Flexion with Resistance  - 1 x daily - 7 x weekly - 2 sets - 10 reps - Standing Single Arm Shoulder Abduction with Resistance  -  1 x daily - 7 x weekly - 2 sets - 10 reps - Shoulder External Rotation and Scapular Retraction with Resistance  - 1 x daily - 7 x weekly - 2 sets - 10 reps  ASSESSMENT:  CLINICAL IMPRESSION: She arrived with no shoulder pain. She is still having increased pain with resisted flexion and all motions past 90*. She tolerated all exercises well, but the weighted wall sides in abduction caused too much pain. She completed the set, but had a lot of compensation. She needed constant cuing with exercises to avoid shoulder elevation. She will be getting an MRI next week.   OBJECTIVE IMPAIRMENTS: decreased strength, impaired UE functional use, and pain.   ACTIVITY LIMITATIONS: carrying, lifting, and reach over head  PARTICIPATION LIMITATIONS: cleaning, laundry, and working out  [facility] POTENTIAL: Good  CLINICAL DECISION MAKING: Stable/uncomplicated  EVALUATION COMPLEXITY: Low   GOALS: Goals reviewed with patient? Yes  SHORT TERM GOALS: Target date: 12/28/23  Patient will be independent with initial HEP.  Baseline: given 11/23/23 Goal status: INITIAL   LONG TERM GOALS: Target date:02/01/24  Patient will be independent with advanced/ongoing HEP to improve outcomes and carryover.  Baseline:  Goal status: INITIAL  2.  Patient will report 75% improvement in L shoulder pain to improve QOL.  Baseline: 7-8/10 Goal status: INITIAL  3.  Patient to be able to return to her normal gym  routine without pain provocation. Baseline: stopped doing arms since pain started Goal status: INITIAL  4.  Patient will demonstrate improved functional UE strength as demonstrated by 4/5 or better. Baseline: 2+ with pain Goal status: INITIAL  5.  Patient will report 10 points improvement on QuickDash to demonstrate improved functional ability.  Baseline: 38.6  Goal status: INITIAL  PLAN:  PT FREQUENCY: 2x/week  PT DURATION: 10 weeks  PLANNED INTERVENTIONS: 97110-Therapeutic exercises, 97530- Therapeutic activity, 97112- Neuromuscular re-education, 97535- Self Care, 82956- Manual therapy, G0283- Electrical stimulation (unattended), 97035- Ultrasound, 21308- Ionotophoresis 4mg /ml Dexamethasone, Patient/Family education, Taping, Dry Needling, Joint mobilization, Cryotherapy, and Moist heat  PLAN FOR NEXT SESSION: L shoulder strengthening as tolerated, pain modalities    Laurelyn Ponder 12/11/2023, 8:40 AM [place] [facility] at [facility] [address] [facility] [place], [place], [address] Phone: [phone]   Fax:  [phone]  Patient Details  Name: SAMEEN LEAS MRN: [id number] Date of Birth: Dec 04, 1945 Referring Provider:  Ellard Gunning, MD  Encounter Date: 12/11/2023   Laurelyn Ponder 12/11/2023, 8:40 AM  [place] [facility] at [facility] [address] [facility]. [place], [place], [address] Phone: [phone]   Fax:  [phone]

## 2023-12-14 ENCOUNTER — Ambulatory Visit: Admitting: Physical Therapy

## 2023-12-14 ENCOUNTER — Encounter: Payer: Self-pay | Admitting: Physical Therapy

## 2023-12-14 DIAGNOSIS — M7542 Impingement syndrome of left shoulder: Secondary | ICD-10-CM | POA: Diagnosis not present

## 2023-12-14 DIAGNOSIS — M6281 Muscle weakness (generalized): Secondary | ICD-10-CM

## 2023-12-14 DIAGNOSIS — M25512 Pain in left shoulder: Secondary | ICD-10-CM | POA: Diagnosis not present

## 2023-12-14 NOTE — Therapy (Signed)
 OUTPATIENT PHYSICAL THERAPY SHOULDER TREATMENT   Patient Name: Ann Vang MRN: [id number] DOB:[birth year], 78 y.o., female Today's Date: 12/14/2023  END OF SESSION:  PT End of Session - 12/14/23 0845     Visit Number 6    Date for PT Re-Evaluation 02/01/24    Authorization Type BCBS    PT Start Time 0845    PT Stop Time 0930    PT Time Calculation (min) 45 min              Past Medical History:  Diagnosis Date   Abnormal breast finding 12/1998   left breast abnl   Arthritis    --basal joint--right hand   Carpal tunnel syndrome, right    Compression fracture of T2 vertebra (HCC) 05/10/2019   Diverticulosis 2003   mild   Lumbar compression fracture (HCC) 07/13/2012   Lumbar disc disorder 2000   bulging   Melena    Menopause    Osteoporosis    Fx L4 age 75 water skiing accident   Palpitations    Thyroid  disease 10/2014   thyroid  nodules--bx'd large nodule and was benign--sees Gen. Careers adviser with [facility]   Past Surgical History:  Procedure Laterality Date   AUGMENTATION MAMMAPLASTY  1983   Implants have been removed   BREAST SURGERY  2010   implants removed ruptured   CERVICAL BIOPSY  W/ LOOP ELECTRODE EXCISION  1996   CIN   IR ANGIOGRAM SELECTIVE EACH ADDITIONAL VESSEL  03/10/2023   IR ANGIOGRAM SELECTIVE EACH ADDITIONAL VESSEL  03/10/2023   IR ANGIOGRAM SELECTIVE EACH ADDITIONAL VESSEL  03/10/2023   IR ANGIOGRAM SELECTIVE EACH ADDITIONAL VESSEL  05/11/2023   IR ANGIOGRAM SELECTIVE EACH ADDITIONAL VESSEL  05/11/2023   IR ANGIOGRAM SELECTIVE EACH ADDITIONAL VESSEL  05/11/2023   IR ANGIOGRAM VISCERAL SELECTIVE  05/11/2023   IR EMBO ART  VEN HEMORR LYMPH EXTRAV  INC GUIDE ROADMAPPING  03/10/2023   IR EMBO ART  VEN HEMORR LYMPH EXTRAV  INC GUIDE ROADMAPPING  05/11/2023   IR EMBO TUMOR ORGAN ISCHEMIA INFARCT INC GUIDE ROADMAPPING  05/13/2017   IR RADIOLOGIST EVAL & MGMT  04/15/2017   IR RADIOLOGIST EVAL & MGMT  05/27/2017   IR RADIOLOGIST EVAL & MGMT  01/21/2018    IR RADIOLOGIST EVAL & MGMT  09/28/2019   IR RADIOLOGIST EVAL & MGMT  10/07/2021   IR RADIOLOGIST EVAL & MGMT  03/31/2023   IR RADIOLOGIST EVAL & MGMT  06/09/2023   IR RENAL SELECTIVE  UNI INC S&I MOD SED  03/10/2023   IR RENAL SUPRASEL UNI S&I MOD SED  05/13/2017   IR RENAL SUPRASEL UNI S&I MOD SED  05/11/2023   IR RENAL SUPRASEL UNI S&I MOD SED  05/11/2023   IR US  GUIDE VASC ACCESS LEFT  05/13/2017   IR US  GUIDE VASC ACCESS RIGHT  03/10/2023   IR US  GUIDE VASC ACCESS RIGHT  05/11/2023   KNEE SURGERY  2012   meniscus rt knee (both)   L-2 fracture  2009   fall in aerobics   L-4 fracture   water skiing     TENOSYNOVECTOMY Right 03/11/2016   Procedure: TENOSYNOVECTOMY FLEXOR RIGHT RING FINGER;  Surgeon: Lyanne Sample, MD;  Location: [facility];  Service: Orthopedics;  Laterality: Right;   thyroid  nodule biopsy  10/2014   --[facility], [place]--   TONSILLECTOMY AND ADENOIDECTOMY  1964   Patient Active Problem List   Diagnosis Date Noted   Renal angiomyolipoma 05/11/2023   Hypokalemia 03/11/2023  Hyponatremia 03/11/2023   Hematoma 03/11/2023   Diverticulosis 03/10/2023   Renal hematoma, left 03/10/2023   Paroxysmal atrial fibrillation on Eliquis  (HCC) 10/02/2020   Compression fracture of T2 vertebra (HCC) 05/10/2019   Left renal hematoma with extravasation s/p embolectomy 03/10/2023 12/22/2018   Low back pain 12/21/2018   Acute pain of right shoulder 11/19/2017   Angiomyolipoma of kidney 05/13/2017   Chronic pain of right knee 09/25/2016   Flexor tenosynovitis of finger 03/14/2016   Pain 02/20/2016   Thyroid  nodule 05/15/2015   Lipoma of back 10/06/2014   Lumbar compression fracture (HCC) 07/13/2012   Ear pressure 07/13/2012   DYSFUNCTION OF EUSTACHIAN TUBE 07/30/2010   Disorder of bone and cartilage 03/15/2010    PCP: Barnetta Liberty  REFERRING PROVIDER: Ellard Gunning  REFERRING DIAG: Z61.096 (ICD-10-CM) - Pain in left shoulder   THERAPY DIAG:  Acute pain of left  shoulder  Muscle weakness (generalized)  Impingement syndrome of left shoulder  Rationale for Evaluation and Treatment: Rehabilitation  ONSET DATE: 11/04/23  SUBJECTIVE:                                                                                                                                                                                      SUBJECTIVE STATEMENT:  Shoulder feels the same as last time. MRI is tomorrow. I feel like PT is making my shoulder stronger.    Hand dominance: Left  PERTINENT HISTORY: Osteoporosis, meniscus surgery 2012, compression fx lumbar 2013, compression fx T2 2020  PAIN:  Are you having pain? Yes: NPRS scale: 0/10 at rest, 5/10  Pain location: L Biceps tendon/ anterior delt Pain description: sharp/pinch Aggravating factors: reaching overhead, lifting, carrying Relieving factors: ice  PRECAUTIONS: None  RED FLAGS: None   WEIGHT BEARING RESTRICTIONS: No  FALLS:  Has patient fallen in last 6 months? No  LIVING ENVIRONMENT: Lives with: lives with their spouse Lives in: House/apartment  OCCUPATION: Retired  PLOF: Independent  PATIENT GOALS: want it to quit hurting so I can do what I want to do   NEXT MD VISIT:   OBJECTIVE:  Note: Objective measures were completed at Evaluation unless otherwise noted.  DIAGNOSTIC FINDINGS: tried to do an MRI but was unable to get in the position   PATIENT SURVEYS:  Quick Dash 38.6  COGNITION: Overall cognitive status: Within functional limits for tasks assessed     SENSATION: WFL  POSTURE: Rounded shoulders  UPPER EXTREMITY ROM: grossly WFL, IR is the most difficult and painful at end range    UPPER EXTREMITY MMT:  MMT Right eval Left eval  Shoulder flexion 4- 2+  Shoulder extension    Shoulder abduction 4-  2+  Shoulder adduction    Shoulder internal rotation 5 4  Shoulder external rotation 4- 2+ with pain  Middle trapezius    Lower trapezius    Elbow flexion    Elbow  extension    Wrist flexion    Wrist extension    Wrist ulnar deviation    Wrist radial deviation    Wrist pronation    Wrist supination    Grip strength (lbs)    (Blank rows = not tested)  SHOULDER SPECIAL TESTS: Impingement tests: Neer impingement test: positive , Hawkins/Kennedy impingement test: positive , and Painful arc test: positive   JOINT MOBILITY TESTING:  Full PROM  PALPATION:  No TTP                                                                                                                              TREATMENT DATE: 12/14/23 UBE L2 each way Abd with back on wall x10 Red band iso abd with  shld flex x10 Shoulder mobs all directions 2# Wate bar supine Flexion/ext x10 Cable shoulder ext 2x12 10# Cable row 10# 2x15 Red band ER 2x10 1# Wall slides flex & ext x10 each   12/09/23 UBE L2 x41min each way AAROM with dowel all direction- pain with flexion 1# flex and abd x10 each Red band ER x 10, manual resistance x10 ER with 90* abd 2# chest press 2x10- cue to unshrug shoulders Cable row and ext 2x12 each 10# Red band pull apart    Ball on wall roll flex, ext x10 each 2# wall slides flex and abd x10 each - increased pain, but was able to complete PROM with end range holds- all directions        12/09/23 UBE L2 x8mins each way AAROM with dowel all directions x10 2# wall slide flexion 2x10  Ball against wall ER/IR isometric x10 Ball on wall 4 way rolls x10 Bicep curls 10# 2x10 Tricep ext 25# 2x10  1# abduction 2x10 1# shoulder flexion 2x10   12/02/23 UBE L1 each way PROM Lt shouler all motions - pain with ER and abd past 90 Red band ER 2x10 1 # shoulder flex  2x10 -pain increased approaching 90 Ball rolls on the wall  Flexion  up and down, circles  Abduct up and donn, circles L Tricep ext 5# 2x10  Shoulder AROM  from 90 abduction flex x10 Abd x10 ER x10  11/30/23 AAROM shoulder Flex, Ext, IR with cane x10 UBE L1 x 2.5 min each   1lb flex and abd 2x10 LUE ER yellow 2x10 Went over shoulder impingement with use of shoulder model Shoulder IR red 2x10 Triceps extension red band 2x10  LUE PROM with end range holds  LUE distraction  11/23/23- EVAL, HEP    PATIENT EDUCATION: Education details: POC, HEP, RTC anatomy and physiology, shoulder impingement  Person educated: Patient Education method: Explanation Education comprehension: verbalized understanding and returned demonstration  HOME  EXERCISE PROGRAM: Access Code: [email or web address] URL: [email or web address] Date: 11/23/2023 Prepared by: Donavon Fudge  Exercises - Shoulder External Rotation with Anchored Resistance  - 1 x daily - 7 x weekly - 2 sets - 10 reps - Standing Shoulder Flexion with Resistance  - 1 x daily - 7 x weekly - 2 sets - 10 reps - Standing Single Arm Shoulder Abduction with Resistance  - 1 x daily - 7 x weekly - 2 sets - 10 reps - Shoulder External Rotation and Scapular Retraction with Resistance  - 1 x daily - 7 x weekly - 2 sets - 10 reps  ASSESSMENT:  CLINICAL IMPRESSION: She arrived with no shoulder pain. She has her MRI tomorrow. She tolerated all exercises well and seems to have more pain free ROM. She needed constant cuing with exercises to maintain proper posture. She was also educated on shoulder mechanics and why certain resisted motions cause more pain than others. Identified her pain as sharp pinch over biceps tendon with motion and palpation. I added in some modifications and cues to remember when performing her HEP.   OBJECTIVE IMPAIRMENTS: decreased strength, impaired UE functional use, and pain.   ACTIVITY LIMITATIONS: carrying, lifting, and reach over head  PARTICIPATION LIMITATIONS: cleaning, laundry, and working out  [facility] POTENTIAL: Good  CLINICAL DECISION MAKING: Stable/uncomplicated  EVALUATION COMPLEXITY: Low   GOALS: Goals reviewed with patient? Yes  SHORT TERM GOALS: Target date: 12/28/23  Patient  will be independent with initial HEP.  Baseline: given 11/23/23 Goal status: INITIAL   LONG TERM GOALS: Target date:02/01/24  Patient will be independent with advanced/ongoing HEP to improve outcomes and carryover.  Baseline:  Goal status: INITIAL  2.  Patient will report 75% improvement in L shoulder pain to improve QOL.  Baseline: 7-8/10 Goal status: INITIAL  3.  Patient to be able to return to her normal gym routine without pain provocation. Baseline: stopped doing arms since pain started Goal status: INITIAL  4.  Patient will demonstrate improved functional UE strength as demonstrated by 4/5 or better. Baseline: 2+ with pain Goal status: INITIAL  5.  Patient will report 10 points improvement on QuickDash to demonstrate improved functional ability.  Baseline: 38.6  Goal status: INITIAL  PLAN:  PT FREQUENCY: 2x/week  PT DURATION: 10 weeks  PLANNED INTERVENTIONS: 97110-Therapeutic exercises, 97530- Therapeutic activity, 97112- Neuromuscular re-education, 97535- Self Care, 54098- Manual therapy, G0283- Electrical stimulation (unattended), 97035- Ultrasound, 11914- Ionotophoresis 4mg /ml Dexamethasone, Patient/Family education, Taping, Dry Needling, Joint mobilization, Cryotherapy, and Moist heat  PLAN FOR NEXT SESSION: L shoulder strengthening as tolerated, pain modalities    Laurelyn Ponder 12/14/2023, 8:46 AM [facility] [address] [facility] [place], [place], [address] Phone: [phone]   Fax:  [phone]  Patient Details  Name: TRAVONNA KOSOWSKI MRN: [id number] Date of Birth: Jun 14, 1946 Referring Provider:  Ellard Gunning, MD  Encounter Date: 12/14/2023   Laurelyn Ponder 12/14/2023, 8:46 AM  [facility] [address]. [place], [place], [address] Phone: [phone]   Fax:  [phone]

## 2023-12-15 DIAGNOSIS — M25512 Pain in left shoulder: Secondary | ICD-10-CM | POA: Diagnosis not present

## 2023-12-16 ENCOUNTER — Encounter: Payer: Self-pay | Admitting: Physical Therapy

## 2023-12-16 ENCOUNTER — Ambulatory Visit: Admitting: Physical Therapy

## 2023-12-16 DIAGNOSIS — M6281 Muscle weakness (generalized): Secondary | ICD-10-CM

## 2023-12-16 DIAGNOSIS — M25512 Pain in left shoulder: Secondary | ICD-10-CM | POA: Diagnosis not present

## 2023-12-16 DIAGNOSIS — M7542 Impingement syndrome of left shoulder: Secondary | ICD-10-CM | POA: Diagnosis not present

## 2023-12-16 DIAGNOSIS — M19012 Primary osteoarthritis, left shoulder: Secondary | ICD-10-CM | POA: Diagnosis not present

## 2023-12-16 NOTE — Therapy (Signed)
 OUTPATIENT PHYSICAL THERAPY SHOULDER TREATMENT   Patient Name: Ann Vang MRN: [id number] DOB:[birth year], 78 y.o., female Today's Date: 12/16/2023  END OF SESSION:  PT End of Session - 12/16/23 0849     Visit Number 7    Date for PT Re-Evaluation 02/01/24    Authorization Type BCBS    PT Start Time 0845    PT Stop Time 0930    PT Time Calculation (min) 45 min              Past Medical History:  Diagnosis Date   Abnormal breast finding 12/1998   left breast abnl   Arthritis    --basal joint--right hand   Carpal tunnel syndrome, right    Compression fracture of T2 vertebra (HCC) 05/10/2019   Diverticulosis 2003   mild   Lumbar compression fracture (HCC) 07/13/2012   Lumbar disc disorder 2000   bulging   Melena    Menopause    Osteoporosis    Fx L4 age 66 water skiing accident   Palpitations    Thyroid  disease 10/2014   thyroid  nodules--bx'd large nodule and was benign--sees Gen. surgeon with [facility]   Past Surgical History:  Procedure Laterality Date   AUGMENTATION MAMMAPLASTY  1983   Implants have been removed   BREAST SURGERY  2010   implants removed ruptured   CERVICAL BIOPSY  W/ LOOP ELECTRODE EXCISION  1996   CIN   IR ANGIOGRAM SELECTIVE EACH ADDITIONAL VESSEL  03/10/2023   IR ANGIOGRAM SELECTIVE EACH ADDITIONAL VESSEL  03/10/2023   IR ANGIOGRAM SELECTIVE EACH ADDITIONAL VESSEL  03/10/2023   IR ANGIOGRAM SELECTIVE EACH ADDITIONAL VESSEL  05/11/2023   IR ANGIOGRAM SELECTIVE EACH ADDITIONAL VESSEL  05/11/2023   IR ANGIOGRAM SELECTIVE EACH ADDITIONAL VESSEL  05/11/2023   IR ANGIOGRAM VISCERAL SELECTIVE  05/11/2023   IR EMBO ART  VEN HEMORR LYMPH EXTRAV  INC GUIDE ROADMAPPING  03/10/2023   IR EMBO ART  VEN HEMORR LYMPH EXTRAV  INC GUIDE ROADMAPPING  05/11/2023   IR EMBO TUMOR ORGAN ISCHEMIA INFARCT INC GUIDE ROADMAPPING  05/13/2017   IR RADIOLOGIST EVAL & MGMT  04/15/2017   IR RADIOLOGIST EVAL & MGMT  05/27/2017   IR RADIOLOGIST EVAL & MGMT  01/21/2018    IR RADIOLOGIST EVAL & MGMT  09/28/2019   IR RADIOLOGIST EVAL & MGMT  10/07/2021   IR RADIOLOGIST EVAL & MGMT  03/31/2023   IR RADIOLOGIST EVAL & MGMT  06/09/2023   IR RENAL SELECTIVE  UNI INC S&I MOD SED  03/10/2023   IR RENAL SUPRASEL UNI S&I MOD SED  05/13/2017   IR RENAL SUPRASEL UNI S&I MOD SED  05/11/2023   IR RENAL SUPRASEL UNI S&I MOD SED  05/11/2023   IR US  GUIDE VASC ACCESS LEFT  05/13/2017   IR US  GUIDE VASC ACCESS RIGHT  03/10/2023   IR US  GUIDE VASC ACCESS RIGHT  05/11/2023   KNEE SURGERY  2012   meniscus rt knee (both)   L-2 fracture  2009   fall in aerobics   L-4 fracture   water skiing     TENOSYNOVECTOMY Right 03/11/2016   Procedure: TENOSYNOVECTOMY FLEXOR RIGHT RING FINGER;  Surgeon: Lyanne Sample, MD;  Location: [facility];  Service: Orthopedics;  Laterality: Right;   thyroid  nodule biopsy  10/2014   --[facility], [place]--   TONSILLECTOMY AND ADENOIDECTOMY  1964   Patient Active Problem List   Diagnosis Date Noted   Renal angiomyolipoma 05/11/2023   Hypokalemia 03/11/2023  Hyponatremia 03/11/2023   Hematoma 03/11/2023   Diverticulosis 03/10/2023   Renal hematoma, left 03/10/2023   Paroxysmal atrial fibrillation on Eliquis  (HCC) 10/02/2020   Compression fracture of T2 vertebra (HCC) 05/10/2019   Left renal hematoma with extravasation s/p embolectomy 03/10/2023 12/22/2018   Low back pain 12/21/2018   Acute pain of right shoulder 11/19/2017   Angiomyolipoma of kidney 05/13/2017   Chronic pain of right knee 09/25/2016   Flexor tenosynovitis of finger 03/14/2016   Pain 02/20/2016   Thyroid  nodule 05/15/2015   Lipoma of back 10/06/2014   Lumbar compression fracture (HCC) 07/13/2012   Ear pressure 07/13/2012   DYSFUNCTION OF EUSTACHIAN TUBE 07/30/2010   Disorder of bone and cartilage 03/15/2010    PCP: Barnetta Liberty  REFERRING PROVIDER: Ellard Gunning  REFERRING DIAG: Z61.096 (ICD-10-CM) - Pain in left shoulder   THERAPY DIAG:  Acute pain of left  shoulder  Muscle weakness (generalized)  Impingement syndrome of left shoulder  Rationale for Evaluation and Treatment: Rehabilitation  ONSET DATE: 11/04/23  SUBJECTIVE:                                                                                                                                                                                      SUBJECTIVE STATEMENT: She is a little sore from MRI yesterday.   Hand dominance: Left  PERTINENT HISTORY: Osteoporosis, meniscus surgery 2012, compression fx lumbar 2013, compression fx T2 2020  PAIN:  Are you having pain? Yes: NPRS scale: 0/10 at rest, 5/10  Pain location: L Biceps tendon/ anterior delt Pain description: sharp/pinch Aggravating factors: reaching overhead, lifting, carrying Relieving factors: ice  PRECAUTIONS: None  RED FLAGS: None   WEIGHT BEARING RESTRICTIONS: No  FALLS:  Has patient fallen in last 6 months? No  LIVING ENVIRONMENT: Lives with: lives with their spouse Lives in: House/apartment  OCCUPATION: Retired  PLOF: Independent  PATIENT GOALS: want it to quit hurting so I can do what I want to do   NEXT MD VISIT:   OBJECTIVE:  Note: Objective measures were completed at Evaluation unless otherwise noted.  DIAGNOSTIC FINDINGS: tried to do an MRI but was unable to get in the position   PATIENT SURVEYS:  Quick Dash 38.6  COGNITION: Overall cognitive status: Within functional limits for tasks assessed     SENSATION: WFL  POSTURE: Rounded shoulders  UPPER EXTREMITY ROM: grossly WFL, IR is the most difficult and painful at end range    UPPER EXTREMITY MMT:  MMT Right eval Left eval  Shoulder flexion 4- 2+  Shoulder extension    Shoulder abduction 4- 2+  Shoulder adduction    Shoulder internal rotation 5 4  Shoulder external rotation 4- 2+ with pain  Middle trapezius    Lower trapezius    Elbow flexion    Elbow extension    Wrist flexion    Wrist extension    Wrist ulnar  deviation    Wrist radial deviation    Wrist pronation    Wrist supination    Grip strength (lbs)    (Blank rows = not tested)  SHOULDER SPECIAL TESTS: Impingement tests: Neer impingement test: positive , Hawkins/Kennedy impingement test: positive , and Painful arc test: positive   JOINT MOBILITY TESTING:  Full PROM  PALPATION:  No TTP                                                                                                                              TREATMENT DATE: 12/16/23 UBE L2 #min each way GH Jt mobs LUE PROM  2# Wate Bar supine flex/ ext x10  Scap protractions  LUE Rhythmic stabilization Green band shlder ext x10 Red band ER x10 Yellow band pull apart x10 ATYT     12/14/23 UBE L2 each way Abd with back on wall x10 Red band iso abd with  shld flex x10 Shoulder mobs all directions 2# Wate bar supine Flexion/ext x10 Cable shoulder ext 2x12 10# Cable row 10# 2x15 Red band ER 2x10 1# Wall slides flex & ext x10 each   12/09/23 UBE L2 x28min each way AAROM with dowel all direction- pain with flexion 1# flex and abd x10 each Red band ER x 10, manual resistance x10 ER with 90* abd 2# chest press 2x10- cue to unshrug shoulders Cable row and ext 2x12 each 10# Red band pull apart    Ball on wall roll flex, ext x10 each 2# wall slides flex and abd x10 each - increased pain, but was able to complete PROM with end range holds- all directions        12/09/23 UBE L2 x60mins each way AAROM with dowel all directions x10 2# wall slide flexion 2x10  Ball against wall ER/IR isometric x10 Ball on wall 4 way rolls x10 Bicep curls 10# 2x10 Tricep ext 25# 2x10  1# abduction 2x10 1# shoulder flexion 2x10   12/02/23 UBE L1 each way PROM Lt shouler all motions - pain with ER and abd past 90 Red band ER 2x10 1 # shoulder flex  2x10 -pain increased approaching 90 Ball rolls on the wall  Flexion  up and down, circles  Abduct up and donn, circles L  Tricep ext 5# 2x10  Shoulder AROM  from 90 abduction flex x10 Abd x10 ER x10  11/30/23 AAROM shoulder Flex, Ext, IR with cane x10 UBE L1 x 2.5 min each  1lb flex and abd 2x10 LUE ER yellow 2x10 Went over shoulder impingement with use of shoulder model Shoulder IR red 2x10 Triceps extension red band 2x10  LUE PROM with end range holds  LUE distraction  11/23/23- EVAL,  HEP    PATIENT EDUCATION: Education details: POC, HEP, RTC anatomy and physiology, shoulder impingement  Person educated: Patient Education method: Explanation Education comprehension: verbalized understanding and returned demonstration  HOME EXERCISE PROGRAM: Access Code: [email or web address] URL: [email or web address] Date: 11/23/2023 Prepared by: Donavon Fudge  Exercises - Shoulder External Rotation with Anchored Resistance  - 1 x daily - 7 x weekly - 2 sets - 10 reps - Standing Shoulder Flexion with Resistance  - 1 x daily - 7 x weekly - 2 sets - 10 reps - Standing Single Arm Shoulder Abduction with Resistance  - 1 x daily - 7 x weekly - 2 sets - 10 reps - Shoulder External Rotation and Scapular Retraction with Resistance  - 1 x daily - 7 x weekly - 2 sets - 10 reps  ASSESSMENT:  CLINICAL IMPRESSION: Pt arrived a little sore from her MRI yesterday. Started with JT mobs to loosen capsule and allow more ROM. She tolerated all exercises well. With TB exercises, I had her stand with her back at the wall as a tactile cue for scapular retraction and proper posture. I also encouraged her to do the same with her HEP. She is still limited with a painful ROM in LUE.  OBJECTIVE IMPAIRMENTS: decreased strength, impaired UE functional use, and pain.   ACTIVITY LIMITATIONS: carrying, lifting, and reach over head  PARTICIPATION LIMITATIONS: cleaning, laundry, and working out  [facility] POTENTIAL: Good  CLINICAL DECISION MAKING: Stable/uncomplicated  EVALUATION COMPLEXITY: Low   GOALS: Goals reviewed with patient?  Yes  SHORT TERM GOALS: Target date: 12/28/23  Patient will be independent with initial HEP.  Baseline: given 11/23/23 Goal status: MET 12/16/23   LONG TERM GOALS: Target date:02/01/24  Patient will be independent with advanced/ongoing HEP to improve outcomes and carryover.  Baseline:  Goal status: INITIAL  2.  Patient will report 75% improvement in L shoulder pain to improve QOL.  Baseline: 7-8/10 Goal status: INITIAL  3.  Patient to be able to return to her normal gym routine without pain provocation. Baseline: stopped doing arms since pain started Goal status: INITIAL  4.  Patient will demonstrate improved functional UE strength as demonstrated by 4/5 or better. Baseline: 2+ with pain Goal status: INITIAL  5.  Patient will report 10 points improvement on QuickDash to demonstrate improved functional ability.  Baseline: 38.6  Goal status: INITIAL  PLAN:  PT FREQUENCY: 2x/week  PT DURATION: 10 weeks  PLANNED INTERVENTIONS: 97110-Therapeutic exercises, 97530- Therapeutic activity, 97112- Neuromuscular re-education, 97535- Self Care, 96295- Manual therapy, G0283- Electrical stimulation (unattended), 97035- Ultrasound, 28413- Ionotophoresis 4mg /ml Dexamethasone, Patient/Family education, Taping, Dry Needling, Joint mobilization, Cryotherapy, and Moist heat  PLAN FOR NEXT SESSION: L shoulder strengthening as tolerated, pain modalities    Laurelyn Ponder 12/16/2023, 8:49 AM [facility] [address] [facility] [place], [place], [address] Phone: [phone]   Fax:  [phone]  Patient Details  Name: Ann Vang MRN: [id number] Date of Birth: 1946/07/20 Referring Provider:  Ellard Gunning, MD  Encounter Date: 12/16/2023   Laurelyn Ponder 12/16/2023, 8:49 AM  [facility] at [facility] [address] [place], [place], [address] Phone: [phone]   Fax:  [phone]

## 2023-12-17 ENCOUNTER — Telehealth: Payer: Self-pay

## 2023-12-17 NOTE — Telephone Encounter (Signed)
   Pre-operative Risk Assessment    Patient Name: Ann Vang  DOB: 1945-09-15 MRN: [id number]   Date of last office visit: 09/07/23 Luana Rumple, MD Date of next office visit: NONE   Request for Surgical Clearance    Procedure:   LEFT REVERSE SHOULDER ARTHROPLASTY  Date of Surgery:  Clearance TBD                                Surgeon:  DR Ernestina Headland SUPPLE Surgeon's Group or Practice Name:  [facility] Phone number:  [phone] Fax number:  [phone]  ATTN: Avanell Bob STONE   Type of Clearance Requested:   - Medical  - Pharmacy:  [facility]  (Eliquis )     Type of Anesthesia:  General    Additional requests/questions:    SignedCollin Deal   12/17/2023, 11:02 AM

## 2023-12-21 ENCOUNTER — Ambulatory Visit: Admitting: Physical Therapy

## 2023-12-23 ENCOUNTER — Ambulatory Visit

## 2023-12-23 NOTE — Telephone Encounter (Signed)
 Patient with diagnosis of afib on Eliquis  for anticoagulation.    Procedure:  LEFT REVERSE SHOULDER ARTHROPLASTY  Date of procedure: TBD  CHA2DS2-VASc Score = 3   This indicates a 3.2% annual risk of stroke. The patient's score is based upon: CHF History: 0 HTN History: 0 Diabetes History: 0 Stroke History: 0 Vascular Disease History: 0 Age Score: 2 Gender Score: 1      CrCl 88.9 ml/min Platelet count 227  Patient is post Afib Abl + LAAO implant on 11/13/2023. She will need to remain on uninterrupted anticoagulation for 90 days post ablation. Of note, this procedure was done with [facility]  Per office protocol, patient can hold Eliquis  for 3 days prior to procedure, but patient cannot hold Eliquis  until AFTER 02/11/24, therefore procedure should not be scheduled prior to 02/15/24   **This guidance is not considered finalized until pre-operative APP has relayed final recommendations.**

## 2023-12-23 NOTE — Telephone Encounter (Signed)
   Name: Ann Vang  DOB: 07-28-1946  MRN: [id number]  Primary Cardiologist: None   Preoperative team, please contact this patient and set up a phone call appointment for further preoperative risk assessment. Please obtain consent and complete medication review. Thank you for your help.  I confirm that guidance regarding antiplatelet and oral anticoagulation therapy has been completed and, if necessary, noted below.  Patient is post Afib Abl + LAAO implant on 11/13/2023. She will need to remain on uninterrupted anticoagulation for 90 days post ablation. Of note, this procedure was done with [facility].   Per office protocol, patient can hold Eliquis  for 3 days prior to procedure, but patient cannot hold Eliquis  until AFTER 02/11/24, therefore procedure should not be scheduled prior to 02/15/24  I also confirmed the patient resides in the state of  . As per [facility] Medical Board telemedicine laws, the patient must reside in the state in which the provider is licensed.   Kearney Evitt D Manna Gose, NP 12/23/2023, 3:27 PM [facility]

## 2023-12-23 NOTE — Telephone Encounter (Signed)
 Left message for pt to call our office and ask for the Preop team to schedule Preop TELE appt. For Cardic clearance.

## 2023-12-24 DIAGNOSIS — C44712 Basal cell carcinoma of skin of right lower limb, including hip: Secondary | ICD-10-CM | POA: Diagnosis not present

## 2023-12-24 NOTE — Telephone Encounter (Signed)
 2nd attempt to reach the pt to schedule tele after 02/15/24, see notes

## 2023-12-25 ENCOUNTER — Telehealth: Payer: Self-pay

## 2023-12-25 NOTE — Telephone Encounter (Signed)
  Patient Consent for Virtual Visit        Ann Vang has provided verbal consent on 12/25/2023 for a virtual visit (video or telephone).   CONSENT FOR VIRTUAL VISIT FOR:  Ann Vang  By participating in this virtual visit I agree to the following:  I hereby voluntarily request, consent and authorize [facility] and its employed or contracted physicians, physician assistants, nurse practitioners or other licensed health care professionals (the Practitioner), to provide me with telemedicine health care services (the "Services") as deemed necessary by the treating Practitioner. I acknowledge and consent to receive the Services by the Practitioner via telemedicine. I understand that the telemedicine visit will involve communicating with the Practitioner through live audiovisual communication technology and the disclosure of certain medical information by electronic transmission. I acknowledge that I have been given the opportunity to request an in-person assessment or other available alternative prior to the telemedicine visit and am voluntarily participating in the telemedicine visit.  I understand that I have the right to withhold or withdraw my consent to the use of telemedicine in the course of my care at any time, without affecting my right to future care or treatment, and that the Practitioner or I may terminate the telemedicine visit at any time. I understand that I have the right to inspect all information obtained and/or recorded in the course of the telemedicine visit and may receive copies of available information for a reasonable fee.  I understand that some of the potential risks of receiving the Services via telemedicine include:  Delay or interruption in medical evaluation due to technological equipment failure or disruption; Information transmitted may not be sufficient (e.g. poor resolution of images) to allow for appropriate medical decision making by the  Practitioner; and/or  In rare instances, security protocols could fail, causing a breach of personal health information.  Furthermore, I acknowledge that it is my responsibility to provide information about my medical history, conditions and care that is complete and accurate to the best of my ability. I acknowledge that Practitioner's advice, recommendations, and/or decision may be based on factors not within their control, such as incomplete or inaccurate data provided by me or distortions of diagnostic images or specimens that may result from electronic transmissions. I understand that the practice of medicine is not an exact science and that Practitioner makes no warranties or guarantees regarding treatment outcomes. I acknowledge that a copy of this consent can be made available to me via my patient portal [facility] MyChart), or I can request a printed copy by calling the office of [facility].    I understand that my insurance will be billed for this visit.   I have read or had this consent read to me. I understand the contents of this consent, which adequately explains the benefits and risks of the Services being provided via telemedicine.  I have been provided ample opportunity to ask questions regarding this consent and the Services and have had my questions answered to my satisfaction. I give my informed consent for the services to be provided through the use of telemedicine in my medical care

## 2023-12-25 NOTE — Telephone Encounter (Signed)
 Pt returning call

## 2023-12-25 NOTE — Telephone Encounter (Signed)
 Spoke to patient, she is scheduled for tele clearance on 02/17/24 at 11:00 for pre-op clearance.

## 2023-12-25 NOTE — Telephone Encounter (Signed)
 3rd attempt to reach the pt to schedule tele preop appt. Please see all notes in regard to tele appt needed. Tele appt can be scheduled after 02/15/24.   I will update the requesting office pt needs to call and schedule tele preop appt to be done after 02/15/24.   I will remove from the preop call back pool at this time until pt calls back.

## 2023-12-28 DIAGNOSIS — I700 Atherosclerosis of aorta: Secondary | ICD-10-CM | POA: Diagnosis not present

## 2023-12-28 DIAGNOSIS — I480 Paroxysmal atrial fibrillation: Secondary | ICD-10-CM | POA: Diagnosis not present

## 2023-12-28 DIAGNOSIS — I083 Combined rheumatic disorders of mitral, aortic and tricuspid valves: Secondary | ICD-10-CM | POA: Diagnosis not present

## 2023-12-30 DIAGNOSIS — I700 Atherosclerosis of aorta: Secondary | ICD-10-CM | POA: Diagnosis not present

## 2023-12-30 DIAGNOSIS — I4891 Unspecified atrial fibrillation: Secondary | ICD-10-CM | POA: Diagnosis not present

## 2023-12-30 DIAGNOSIS — R319 Hematuria, unspecified: Secondary | ICD-10-CM | POA: Diagnosis not present

## 2023-12-31 DIAGNOSIS — D1771 Benign lipomatous neoplasm of kidney: Secondary | ICD-10-CM | POA: Diagnosis not present

## 2024-01-19 ENCOUNTER — Encounter: Payer: Self-pay | Admitting: Cardiovascular Disease

## 2024-01-20 ENCOUNTER — Encounter: Payer: Self-pay | Admitting: Internal Medicine

## 2024-01-20 ENCOUNTER — Ambulatory Visit: Admitting: Internal Medicine

## 2024-01-20 VITALS — BP 122/80 | HR 83 | Ht 67.0 in | Wt 165.5 lb

## 2024-01-20 DIAGNOSIS — Z1211 Encounter for screening for malignant neoplasm of colon: Secondary | ICD-10-CM | POA: Diagnosis not present

## 2024-01-20 DIAGNOSIS — Z7901 Long term (current) use of anticoagulants: Secondary | ICD-10-CM

## 2024-01-20 NOTE — Patient Instructions (Addendum)
 It has been recommended to you by your physician that you have a(n) colonoscopy completed. Per your request, we did not schedule the procedure(s) today. Please contact our office at [phone] should you decide to have the procedure completed. You will be scheduled for a pre-visit and procedure at that time.  _______________________________________________________  If your blood pressure at your visit was 140/90 or greater, please contact your primary care physician to follow up on this.  _______________________________________________________  If you are age 72 or older, your body mass index should be between 23-30. Your Body mass index is 25.92 kg/m. If this is out of the aforementioned range listed, please consider follow up with your Primary Care Provider.  If you are age 25 or younger, your body mass index should be between 19-25. Your Body mass index is 25.92 kg/m. If this is out of the aformentioned range listed, please consider follow up with your Primary Care Provider.   ________________________________________________________  The [facility] providers would like to encourage you to use MYCHART to communicate with providers for non-urgent requests or questions.  Due to long hold times on the telephone, sending your provider a message by [facility] may be a faster and more efficient way to get a response.  Please allow 48 business hours for a response.  Please remember that this is for non-urgent requests.  _______________________________________________________   Thank you for entrusting me with your care and for choosing [facility],  Dr. Regino Caprio

## 2024-01-20 NOTE — Progress Notes (Signed)
 Chief Complaint: Colon cancer screening  HPI : 78 year old female with history of A-fib on Eliquis , osteoporosis, arthritis presents for colon cancer screening  She denies rectal bleeding. She sometimes has constipation issues. Normally she is pretty regular with her BMs. She has diarrhea on occasion. Denies unintentional weight loss. She had a kidney stone two months ago, which caused some N&V. After resolution of her kidney stones, her N&V improved as well. She had embolization of her angiomyolipoma in 04/2023. She recently had a Watchman procedure at the end of 10/2023 to try to reduce her risk of bleeding. She has to remain on anticoagulation until 02/2024 when she has an appt with cardiology come off of Eliquis  if she is doing well. Denies ab pain. Denies chest burning or regurgitation. Denies dysphagia. Great grandfather and grandmother had colon cancer. Grandmother was 89-90 when she died of colon cancer.  Past Medical History:  Diagnosis Date   Abnormal breast finding 12/1998   left breast abnl   Arthritis    --basal joint--right hand   Carpal tunnel syndrome, right    Compression fracture of T2 vertebra (HCC) 05/10/2019   Diverticulosis 2003   mild   Lumbar compression fracture (HCC) 07/13/2012   Lumbar disc disorder 2000   bulging   Melena    Menopause    Osteoporosis    Fx L4 age 34 water skiing accident   Palpitations    Thyroid  disease 10/2014   thyroid  nodules--bx'd large nodule and was benign--sees Gen. Careers adviser with [facility]     Past Surgical History:  Procedure Laterality Date   AUGMENTATION MAMMAPLASTY  1983   Implants have been removed   BREAST SURGERY  2010   implants removed ruptured   CERVICAL BIOPSY  W/ LOOP ELECTRODE EXCISION  1996   CIN   IR ANGIOGRAM SELECTIVE EACH ADDITIONAL VESSEL  03/10/2023   IR ANGIOGRAM SELECTIVE EACH ADDITIONAL VESSEL  03/10/2023   IR ANGIOGRAM SELECTIVE EACH ADDITIONAL VESSEL  03/10/2023   IR ANGIOGRAM SELECTIVE EACH ADDITIONAL  VESSEL  05/11/2023   IR ANGIOGRAM SELECTIVE EACH ADDITIONAL VESSEL  05/11/2023   IR ANGIOGRAM SELECTIVE EACH ADDITIONAL VESSEL  05/11/2023   IR ANGIOGRAM VISCERAL SELECTIVE  05/11/2023   IR EMBO ART  VEN HEMORR LYMPH EXTRAV  INC GUIDE ROADMAPPING  03/10/2023   IR EMBO ART  VEN HEMORR LYMPH EXTRAV  INC GUIDE ROADMAPPING  05/11/2023   IR EMBO TUMOR ORGAN ISCHEMIA INFARCT INC GUIDE ROADMAPPING  05/13/2017   IR RADIOLOGIST EVAL & MGMT  04/15/2017   IR RADIOLOGIST EVAL & MGMT  05/27/2017   IR RADIOLOGIST EVAL & MGMT  01/21/2018   IR RADIOLOGIST EVAL & MGMT  09/28/2019   IR RADIOLOGIST EVAL & MGMT  10/07/2021   IR RADIOLOGIST EVAL & MGMT  03/31/2023   IR RADIOLOGIST EVAL & MGMT  06/09/2023   IR RENAL SELECTIVE  UNI INC S&I MOD SED  03/10/2023   IR RENAL SUPRASEL UNI S&I MOD SED  05/13/2017   IR RENAL SUPRASEL UNI S&I MOD SED  05/11/2023   IR RENAL SUPRASEL UNI S&I MOD SED  05/11/2023   IR US  GUIDE VASC ACCESS LEFT  05/13/2017   IR US  GUIDE VASC ACCESS RIGHT  03/10/2023   IR US  GUIDE VASC ACCESS RIGHT  05/11/2023   KNEE SURGERY  2012   meniscus rt knee (both)   L-2 fracture  2009   fall in aerobics   L-4 fracture   water skiing     TENOSYNOVECTOMY Right  03/11/2016   Procedure: TENOSYNOVECTOMY FLEXOR RIGHT RING FINGER;  Surgeon: Lyanne Sample, MD;  Location: [facility];  Service: Orthopedics;  Laterality: Right;   thyroid  nodule biopsy  10/2014   --[facility], [place]--   TONSILLECTOMY AND ADENOIDECTOMY  1964   Family History  Problem Relation Age of Onset   Osteoporosis Mother    Alzheimer's disease Mother    Hypertension Father    Heart attack Father    Osteoporosis Maternal Grandmother    Colon cancer Maternal Grandmother    Colon cancer Other    Social History   Tobacco Use   Smoking status: Former    Current packs/day: 0.00    Types: Cigarettes    Quit date: 12/21/1972    Years since quitting: 51.1   Smokeless tobacco: Never  Vaping Use   Vaping status: Never Used  Substance  Use Topics   Alcohol  use: Yes    Alcohol /week: 14.0 standard drinks of alcohol     Types: 14 Glasses of wine per week   Drug use: No   Current Outpatient Medications  Medication Sig Dispense Refill   apixaban  (ELIQUIS ) 5 MG TABS tablet Take 1 tablet (5 mg total) by mouth 2 (two) times daily. 180 tablet 3   Bacillus Coagulans-Inulin (PROBIOTIC) 1-250 BILLION-MG CAPS Take 1 capsule by mouth daily.     Cholecalciferol  (VITAMIN D ) 2000 UNITS tablet Take 2,000 Units by mouth daily.     estradiol  (VIVELLE -DOT) 0.0375 MG/24HR Place 1 patch onto the skin 2 (two) times a week.     fluticasone  (FLONASE ) 50 MCG/ACT nasal spray Place 2 sprays into the nose daily. (Patient taking differently: Place 2 sprays into the nose daily as needed for allergies.) 48 g 1   magnesium gluconate (MAGONATE) 500 MG tablet Take 500 mg by mouth 2 (two) times daily.     milk thistle 175 MG tablet Take 175 mg by mouth daily.     OVER THE COUNTER MEDICATION Take 1 tablet by mouth daily at 6 (six) AM. Medication: Vitamin K7     progesterone  (PROMETRIUM ) 100 MG capsule Take 100 mg by mouth daily.     SUMAtriptan (IMITREX) 50 MG tablet Take 50 mg by mouth every 2 (two) hours as needed for migraine.     No current facility-administered medications for this visit.   No Known Allergies   Review of Systems: All systems reviewed and negative except where noted in HPI.   Physical Exam: BP 122/80 (BP Location: Left Arm, Patient Position: Sitting, Cuff Size: Normal)   Pulse 83   Ht 5\' 7"  (1.702 m)   Wt 165 lb 8 oz (75.1 kg)   LMP 08/18/1994 (Approximate)   SpO2 97%   BMI 25.92 kg/m  Constitutional: Pleasant,well-developed, female in no acute distress. HEENT: Normocephalic and atraumatic. Conjunctivae are normal. No scleral icterus. Cardiovascular: Normal rate, regular rhythm.  Pulmonary/chest: Effort normal and breath sounds normal. No wheezing, rales or rhonchi. Abdominal: Soft, nondistended, nontender. Bowel sounds  active throughout. There are no masses palpable. No hepatomegaly. Extremities: No edema Neurological: Alert and oriented to person place and time. Skin: Skin is warm and dry. No rashes noted. Psychiatric: Normal mood and affect. Behavior is normal.  Labs 10/2023: CBC nml. BMP nml. LFTs nml. Lipase nml.   CT A/P w/contrast 03/10/23: IMPRESSION: Patient is reportedly status post embolization of left renal angiomyolipoma according to prior exam. There is now interval development of large left renal subcapsular hematoma and associated left pararenal hematoma superior to the  left kidney, active extravasation of contrast suggesting active hemorrhage. Critical Value/emergent results were called by telephone at the time of interpretation on 03/10/2023 at 4:08 pm to provider Dr. Randal Bury, who verbally acknowledged these results. Sigmoid diverticulosis without inflammation. Aortic Atherosclerosis (ICD10-I70.0).  CT A/P w/o contrast 10/27/23: IMPRESSION: 1. There is a 3 x 4 mm right ureteropelvic junction calculus causing mild-to-moderate proximal hydronephrosis. There are additional bilateral nonobstructing renal calculi, as described above. 2. Multiple other nonacute observations, as described above. 3. Aortic atherosclerosis. Aortic Atherosclerosis (ICD10-I70.0).  Colonoscopy 12/11/06: 6 mm sigmoid colon polyp that was removed. External hemorrhoids. Path: HYPERPLASTIC POLYP(S). NO ADENOMATOUS   CHANGE OR MALIGNANCY IDENTIFIED.   Colonoscopy 01/11/14: ENDOSCOPIC IMPRESSION: Mild diverticulosis was noted throughout the entire examined colon  ASSESSMENT AND PLAN: Colon cancer screening Patient presents to discuss colon cancer screening. Her last colonoscopy was in 2015 that was normal. She is currently on Eliquis  but recently had a Watchman procedure in 10/2023 so she should be able to come off of Eliquis  in 02/2024 when she sees cardiology again. I went over in detail the risks and benefits of the  colonoscopy procedure with the patient, and she is agreeable to proceeding.  - Colonoscopy [facility]. Will schedule after 03/11/24 cardiology appt when she will be off of the Eliquis .    Regino Caprio, MD  I spent 45 minutes of time, including in depth chart review, independent review of results as outlined above, communicating results with the patient directly, face-to-face time with the patient, coordinating care, ordering studies and medications as appropriate, and documentation.

## 2024-01-21 ENCOUNTER — Other Ambulatory Visit: Payer: Self-pay | Admitting: Interventional Radiology

## 2024-01-21 ENCOUNTER — Encounter

## 2024-01-21 DIAGNOSIS — D1771 Benign lipomatous neoplasm of kidney: Secondary | ICD-10-CM

## 2024-01-25 DIAGNOSIS — M1711 Unilateral primary osteoarthritis, right knee: Secondary | ICD-10-CM | POA: Diagnosis not present

## 2024-02-04 ENCOUNTER — Encounter: Admitting: Gastroenterology

## 2024-02-12 DIAGNOSIS — I4891 Unspecified atrial fibrillation: Secondary | ICD-10-CM | POA: Diagnosis not present

## 2024-02-17 ENCOUNTER — Ambulatory Visit: Attending: Cardiology | Admitting: Emergency Medicine

## 2024-02-17 DIAGNOSIS — Z01810 Encounter for preprocedural cardiovascular examination: Secondary | ICD-10-CM

## 2024-02-17 NOTE — Progress Notes (Signed)
 Virtual Visit via Telephone Note   Because of Ann Vang co-morbid illnesses, she is at least at moderate risk for complications without adequate follow up.  This format is felt to be most appropriate for this patient at this time.  Due to technical limitations with video connection (technology), today's appointment will be conducted as an audio only telehealth visit, and Ann Vang verbally agreed to proceed in this manner.   All issues noted in this document were discussed and addressed.  No physical exam could be performed with this format.  Evaluation Performed:  Preoperative cardiovascular risk assessment _____________   Date:  02/17/2024   Patient ID:  Ann Vang, DOB [birth year], MRN [id number] Patient Location:  Home Provider location:   Office  Primary Care Provider:  Larnell Hamilton, MD Primary Cardiologist:  None  Chief Complaint / Patient Profile   78 y.o. y/o female with a h/o paroxysmal atrial fibrillation s/p LAAO and A-fib ablation on 10/2023 who is pending left reverse shoulder arthroplasty with [facility] and presents today for telephonic preoperative cardiovascular risk assessment.  History of Present Illness    Ann Vang is a 78 y.o. female who presents via audio/video conferencing for a telehealth visit today.  Pt was last seen in cardiology clinic on 09/07/2023 by Dr. Francyne.  At that time Ann Vang was doing well.  The patient is now pending procedure as outlined above. Since her last visit, she is doing well without acute cardiovascular concerns or complaint.  Reports her surgery went well and she denies any symptoms for recurrent atrial fibrillation.  She is without any anginal symptoms.  Denies chest pain or any dyspnea as well as palpitations.  Past Medical History    Past Medical History:  Diagnosis Date   Abnormal breast finding 12/1998   left breast abnl   Arthritis    --basal joint--right hand   Carpal tunnel  syndrome, right    Compression fracture of T2 vertebra (HCC) 05/10/2019   Diverticulosis 2003   mild   Lumbar compression fracture (HCC) 07/13/2012   Lumbar disc disorder 2000   bulging   Melena    Menopause    Osteoporosis    Fx L4 age 61 water skiing accident   Palpitations    Thyroid  disease 10/2014   thyroid  nodules--bx'd large nodule and was benign--sees Gen. Careers adviser with [facility]   Past Surgical History:  Procedure Laterality Date   AUGMENTATION MAMMAPLASTY  1983   Implants have been removed   BREAST SURGERY  2010   implants removed ruptured   CERVICAL BIOPSY  W/ LOOP ELECTRODE EXCISION  1996   CIN   IR ANGIOGRAM SELECTIVE EACH ADDITIONAL VESSEL  03/10/2023   IR ANGIOGRAM SELECTIVE EACH ADDITIONAL VESSEL  03/10/2023   IR ANGIOGRAM SELECTIVE EACH ADDITIONAL VESSEL  03/10/2023   IR ANGIOGRAM SELECTIVE EACH ADDITIONAL VESSEL  05/11/2023   IR ANGIOGRAM SELECTIVE EACH ADDITIONAL VESSEL  05/11/2023   IR ANGIOGRAM SELECTIVE EACH ADDITIONAL VESSEL  05/11/2023   IR ANGIOGRAM VISCERAL SELECTIVE  05/11/2023   IR EMBO ART  VEN HEMORR LYMPH EXTRAV  INC GUIDE ROADMAPPING  03/10/2023   IR EMBO ART  VEN HEMORR LYMPH EXTRAV  INC GUIDE ROADMAPPING  05/11/2023   IR EMBO TUMOR ORGAN ISCHEMIA INFARCT INC GUIDE ROADMAPPING  05/13/2017   IR RADIOLOGIST EVAL & MGMT  04/15/2017   IR RADIOLOGIST EVAL & MGMT  05/27/2017   IR RADIOLOGIST EVAL & MGMT  01/21/2018   IR RADIOLOGIST EVAL &  MGMT  09/28/2019   IR RADIOLOGIST EVAL & MGMT  10/07/2021   IR RADIOLOGIST EVAL & MGMT  03/31/2023   IR RADIOLOGIST EVAL & MGMT  06/09/2023   IR RENAL SELECTIVE  UNI INC S&I MOD SED  03/10/2023   IR RENAL SUPRASEL UNI S&I MOD SED  05/13/2017   IR RENAL SUPRASEL UNI S&I MOD SED  05/11/2023   IR RENAL SUPRASEL UNI S&I MOD SED  05/11/2023   IR US  GUIDE VASC ACCESS LEFT  05/13/2017   IR US  GUIDE VASC ACCESS RIGHT  03/10/2023   IR US  GUIDE VASC ACCESS RIGHT  05/11/2023   KNEE SURGERY  2012   meniscus rt knee (both)   L-2 fracture  2009    fall in aerobics   L-4 fracture   water skiing     TENOSYNOVECTOMY Right 03/11/2016   Procedure: TENOSYNOVECTOMY FLEXOR RIGHT RING FINGER;  Surgeon: Arley Curia, MD;  Location: [facility];  Service: Orthopedics;  Laterality: Right;   thyroid  nodule biopsy  10/2014   --[facility], [place]--   TONSILLECTOMY AND ADENOIDECTOMY  1964    Allergies  No Known Allergies  Home Medications    Prior to Admission medications   Medication Sig Start Date End Date Taking? Authorizing Provider  apixaban  (ELIQUIS ) 5 MG TABS tablet Take 1 tablet (5 mg total) by mouth 2 (two) times daily. 09/07/23   Croitoru, Mihai, MD  Bacillus Coagulans-Inulin (PROBIOTIC) 1-250 BILLION-MG CAPS Take 1 capsule by mouth daily.    [provider]  Cholecalciferol  (VITAMIN D ) 2000 UNITS tablet Take 2,000 Units by mouth daily.    [provider]  estradiol  (VIVELLE -DOT) 0.0375 MG/24HR Place 1 patch onto the skin 2 (two) times a week. 04/16/21   [provider]  fluticasone  (FLONASE ) 50 MCG/ACT nasal spray Place 2 sprays into the nose daily. Patient taking differently: Place 2 sprays into the nose daily as needed for allergies. 07/13/12   Eyvonne Debby ORN, MD  magnesium gluconate (MAGONATE) 500 MG tablet Take 500 mg by mouth 2 (two) times daily.    [provider]  milk thistle 175 MG tablet Take 175 mg by mouth daily.    [provider]  OVER THE COUNTER MEDICATION Take 1 tablet by mouth daily at 6 (six) AM. Medication: Vitamin K7    [provider]  progesterone  (PROMETRIUM ) 100 MG capsule Take 100 mg by mouth daily. 03/22/23   [provider]  SUMAtriptan (IMITREX) 50 MG tablet Take 50 mg by mouth every 2 (two) hours as needed for migraine. 03/30/23   [provider]    Physical Exam    Vital Signs:  Ann Vang does not have vital signs available for review today.  Given telephonic nature of communication, physical exam is  limited. AAOx3. NAD. Normal affect.  Speech and respirations are unlabored.  Accessory Clinical Findings    None  Assessment & Plan    1.  Preoperative Cardiovascular Risk Assessment:  Patient with recent LAAO and A-fib ablation on 10/2023 with [facility] cardiology.  She is no longer on Eliquis .  However is now on Plavix and aspirin until 80-month post watchman implantation.  Then maintained on baby aspirin alone after 33-month mark.  Per last office note from [facility] cardiology, plan will be to delay shoulder surgery until off Plavix to avoid complications.  Shoulder surgery will need to be rescheduled until after follow-up appointment with [facility] cardiology on 05/2024.  For preoperative  clearance of shoulder surgery please reach out to [facility] cardiology for medical and medicine clearance.  Time:   Today, I have spent 8 minutes with the patient with telehealth technology discussing medical history, symptoms, and management plan.     Lum LITTIE Louis, NP  02/17/2024, 10:55 AM

## 2024-02-23 DIAGNOSIS — H52203 Unspecified astigmatism, bilateral: Secondary | ICD-10-CM | POA: Diagnosis not present

## 2024-02-23 DIAGNOSIS — H35373 Puckering of macula, bilateral: Secondary | ICD-10-CM | POA: Diagnosis not present

## 2024-02-23 DIAGNOSIS — D3131 Benign neoplasm of right choroid: Secondary | ICD-10-CM | POA: Diagnosis not present

## 2024-02-23 DIAGNOSIS — H2513 Age-related nuclear cataract, bilateral: Secondary | ICD-10-CM | POA: Diagnosis not present

## 2024-03-02 ENCOUNTER — Encounter

## 2024-03-07 DIAGNOSIS — N200 Calculus of kidney: Secondary | ICD-10-CM | POA: Diagnosis not present

## 2024-03-07 DIAGNOSIS — D1771 Benign lipomatous neoplasm of kidney: Secondary | ICD-10-CM | POA: Diagnosis not present

## 2024-03-08 ENCOUNTER — Ambulatory Visit
Admission: EM | Admit: 2024-03-08 | Discharge: 2024-03-08 | Disposition: A | Discharge status: Home / Self Care | Attending: Physician Assistant | Admitting: Physician Assistant

## 2024-03-08 ENCOUNTER — Ambulatory Visit (INDEPENDENT_AMBULATORY_CARE_PROVIDER_SITE_OTHER): Admitting: Radiology

## 2024-03-08 ENCOUNTER — Other Ambulatory Visit: Payer: Self-pay

## 2024-03-08 DIAGNOSIS — S97111A Crushing injury of right great toe, initial encounter: Secondary | ICD-10-CM | POA: Diagnosis not present

## 2024-03-08 DIAGNOSIS — M79674 Pain in right toe(s): Secondary | ICD-10-CM | POA: Diagnosis not present

## 2024-03-08 DIAGNOSIS — S90111A Contusion of right great toe without damage to nail, initial encounter: Secondary | ICD-10-CM | POA: Diagnosis not present

## 2024-03-08 NOTE — ED Provider Notes (Signed)
 [facility]    CSN: [id number] Arrival date & time: 03/08/24  1322      History   Chief Complaint Chief Complaint  Patient presents with   Toe Injury    HPI Ann Vang is a 78 y.o. female.   HPI  Pt reports concerns for right great toe injury from a weight falling on it while she was exercising  She reports that the area is tender and does hurt but is manageable  She states its a 5/10 on pain scale  She reports she is on blood thinners which is likely causing her significant bruising    Past Medical History:  Diagnosis Date   Abnormal breast finding 12/1998   left breast abnl   Arthritis    --basal joint--right hand   Carpal tunnel syndrome, right    Compression fracture of T2 vertebra (HCC) 05/10/2019   Diverticulosis 2003   mild   Lumbar compression fracture (HCC) 07/13/2012   Lumbar disc disorder 2000   bulging   Melena    Menopause    Osteoporosis    Fx L4 age 65 water skiing accident   Palpitations    Thyroid  disease 10/2014   thyroid  nodules--bx'd large nodule and was benign--sees Gen. surgeon with [facility]    Patient Active Problem List   Diagnosis Date Noted   Renal angiomyolipoma 05/11/2023   Hypokalemia 03/11/2023   Hyponatremia 03/11/2023   Hematoma 03/11/2023   Diverticulosis 03/10/2023   Renal hematoma, left 03/10/2023   Paroxysmal atrial fibrillation on Eliquis  (HCC) 10/02/2020   Compression fracture of T2 vertebra (HCC) 05/10/2019   Left renal hematoma with extravasation s/p embolectomy 03/10/2023 12/22/2018   Low back pain 12/21/2018   Acute pain of right shoulder 11/19/2017   Angiomyolipoma of kidney 05/13/2017   Chronic pain of right knee 09/25/2016   Flexor tenosynovitis of finger 03/14/2016   Pain 02/20/2016   Thyroid  nodule 05/15/2015   Lipoma of back 10/06/2014   Lumbar compression fracture (HCC) 07/13/2012   Ear pressure 07/13/2012   DYSFUNCTION OF EUSTACHIAN TUBE 07/30/2010   Disorder of bone and  cartilage 03/15/2010    Past Surgical History:  Procedure Laterality Date   AUGMENTATION MAMMAPLASTY  1983   Implants have been removed   BREAST SURGERY  2010   implants removed ruptured   CERVICAL BIOPSY  W/ LOOP ELECTRODE EXCISION  1996   CIN   IR ANGIOGRAM SELECTIVE EACH ADDITIONAL VESSEL  03/10/2023   IR ANGIOGRAM SELECTIVE EACH ADDITIONAL VESSEL  03/10/2023   IR ANGIOGRAM SELECTIVE EACH ADDITIONAL VESSEL  03/10/2023   IR ANGIOGRAM SELECTIVE EACH ADDITIONAL VESSEL  05/11/2023   IR ANGIOGRAM SELECTIVE EACH ADDITIONAL VESSEL  05/11/2023   IR ANGIOGRAM SELECTIVE EACH ADDITIONAL VESSEL  05/11/2023   IR ANGIOGRAM VISCERAL SELECTIVE  05/11/2023   IR EMBO ART  VEN HEMORR LYMPH EXTRAV  INC GUIDE ROADMAPPING  03/10/2023   IR EMBO ART  VEN HEMORR LYMPH EXTRAV  INC GUIDE ROADMAPPING  05/11/2023   IR EMBO TUMOR ORGAN ISCHEMIA INFARCT INC GUIDE ROADMAPPING  05/13/2017   IR RADIOLOGIST EVAL & MGMT  04/15/2017   IR RADIOLOGIST EVAL & MGMT  05/27/2017   IR RADIOLOGIST EVAL & MGMT  01/21/2018   IR RADIOLOGIST EVAL & MGMT  09/28/2019   IR RADIOLOGIST EVAL & MGMT  10/07/2021   IR RADIOLOGIST EVAL & MGMT  03/31/2023   IR RADIOLOGIST EVAL & MGMT  06/09/2023   IR RENAL SELECTIVE  UNI INC S&I MOD SED  03/10/2023  IR RENAL SUPRASEL UNI S&I MOD SED  05/13/2017   IR RENAL SUPRASEL UNI S&I MOD SED  05/11/2023   IR RENAL SUPRASEL UNI S&I MOD SED  05/11/2023   IR US  GUIDE VASC ACCESS LEFT  05/13/2017   IR US  GUIDE VASC ACCESS RIGHT  03/10/2023   IR US  GUIDE VASC ACCESS RIGHT  05/11/2023   KNEE SURGERY  2012   meniscus rt knee (both)   L-2 fracture  2009   fall in aerobics   L-4 fracture   water skiing     TENOSYNOVECTOMY Right 03/11/2016   Procedure: TENOSYNOVECTOMY FLEXOR RIGHT RING FINGER;  Surgeon: Arley Curia, MD;  Location: [facility];  Service: Orthopedics;  Laterality: Right;   thyroid  nodule biopsy  10/2014   --[facility], [place]--   TONSILLECTOMY AND ADENOIDECTOMY  1964    OB History      Gravida  0   Para      Term      Preterm      AB      Living         SAB      IAB      Ectopic      Multiple      Live Births               Home Medications    Prior to Admission medications   Medication Sig Start Date End Date Taking? Authorizing Provider  PLAVIX 75 MG tablet  02/12/24  Yes [provider]  apixaban  (ELIQUIS ) 5 MG TABS tablet Take 1 tablet (5 mg total) by mouth 2 (two) times daily. 09/07/23   Croitoru, Mihai, MD  Bacillus Coagulans-Inulin (PROBIOTIC) 1-250 BILLION-MG CAPS Take 1 capsule by mouth daily.    [provider]  Cholecalciferol  (VITAMIN D ) 2000 UNITS tablet Take 2,000 Units by mouth daily.    [provider]  estradiol  (VIVELLE -DOT) 0.0375 MG/24HR Place 1 patch onto the skin 2 (two) times a week. 04/16/21   [provider]  fluticasone  (FLONASE ) 50 MCG/ACT nasal spray Place 2 sprays into the nose daily. Patient taking differently: Place 2 sprays into the nose daily as needed for allergies. 07/13/12   Eyvonne Debby ORN, MD  magnesium gluconate (MAGONATE) 500 MG tablet Take 500 mg by mouth 2 (two) times daily.    [provider]  milk thistle 175 MG tablet Take 175 mg by mouth daily.    [provider]  OVER THE COUNTER MEDICATION Take 1 tablet by mouth daily at 6 (six) AM. Medication: Vitamin K7    [provider]  progesterone  (PROMETRIUM ) 100 MG capsule Take 100 mg by mouth daily. 03/22/23   [provider]  SUMAtriptan (IMITREX) 50 MG tablet Take 50 mg by mouth every 2 (two) hours as needed for migraine. 03/30/23   [provider]    Family History Family History  Problem Relation Age of Onset   Osteoporosis Mother    Alzheimer's disease Mother    Hypertension Father    Heart attack Father    Osteoporosis Maternal Grandmother    Colon cancer Maternal Grandmother    Colon cancer Other     Social History Social History   Tobacco Use   Smoking status:  Former    Current packs/day: 0.00    Types: Cigarettes    Quit date: 12/21/1972    Years since quitting: 51.2   Smokeless tobacco: Never  Vaping Use   Vaping status: Never Used  Substance Use Topics   Alcohol  use: Yes    Alcohol /week: 14.0 standard drinks of alcohol     Types: 14 Glasses of wine per week   Drug use: No     Allergies   Patient has no known allergies.   Review of Systems Review of Systems  Skin:        Bruising and injury to right great toe      Physical Exam Triage Vital Signs ED Triage Vitals  Encounter Vitals Group     BP 03/08/24 1342 (!) 156/80     Girls Systolic BP Percentile --      Girls Diastolic BP Percentile --      Boys Systolic BP Percentile --      Boys Diastolic BP Percentile --      Pulse Rate 03/08/24 1342 83     Resp 03/08/24 1342 17     Temp 03/08/24 1342 97.8 F (36.6 C)     Temp Source 03/08/24 1342 Oral     SpO2 03/08/24 1342 96 %     Weight 03/08/24 1345 165 lb 9.1 oz (75.1 kg)     Height 03/08/24 1345 5' 7 (1.702 m)     Head Circumference --      Peak Flow --      Pain Score 03/08/24 1344 5     Pain Loc --      Pain Education --      Exclude from Growth Chart --    No data found.  Updated Vital Signs BP (!) 156/80 (BP Location: Right Arm)   Pulse 83   Temp 97.8 F (36.6 C) (Oral)   Resp 17   Ht 5' 7 (1.702 m)   Wt 165 lb 9.1 oz (75.1 kg)   LMP 08/18/1994 (Approximate)   SpO2 96%   BMI 25.93 kg/m   Visual Acuity Right Eye Distance:   Left Eye Distance:   Bilateral Distance:    Right Eye Near:   Left Eye Near:    Bilateral Near:     Physical Exam Vitals reviewed.  Constitutional:      General: She is awake.     Appearance: Normal appearance. She is well-developed and well-groomed.  HENT:     Head: Normocephalic and atraumatic.  Eyes:     General: Lids are normal. Gaze aligned appropriately.     Extraocular Movements: Extraocular movements intact.     Conjunctiva/sclera: Conjunctivae normal.   Cardiovascular:     Pulses:          Dorsalis pedis pulses are 1+ on the right side.       Posterior tibial pulses are 2+ on the right side.  Pulmonary:     Effort: Pulmonary effort is normal.  Musculoskeletal:       Feet:  Feet:     Comments: Pt has mild purple bruising along the dorsum of the right great toe. She is able to flex and extend her toe but this is mildly painful. Cap refill at distal aspect of right great toe is less than 2 sec  Neurological:     Mental Status: She is alert and oriented to person, place, and time.  Psychiatric:        Attention and Perception: Attention and perception normal.        Mood and Affect: Mood and affect normal.        Speech: Speech normal.        Behavior: Behavior normal. Behavior is cooperative.  UC Treatments / Results  Labs (all labs ordered are listed, but only abnormal results are displayed) Labs Reviewed - No data to display  EKG   Radiology DG Foot Complete Right Result Date: 03/08/2024 CLINICAL DATA:  Right great toe injury from falling weight EXAM: RIGHT FOOT COMPLETE - 3 VIEW COMPARISON:  Right foot radiographs dated 04/05/2012 FINDINGS: There is no evidence of fracture or dislocation. There is no evidence of arthropathy or other focal bone abnormality. Soft tissues are unremarkable. IMPRESSION: No acute fracture or dislocation. Electronically Signed   By: Limin  Xu M.D.   On: 03/08/2024 14:07    Procedures Procedures (including critical care time)  Medications Ordered in UC Medications - No data to display  Initial Impression / Assessment and Plan / UC Course  I have reviewed the triage vital signs and the nursing notes.  Pertinent labs & imaging results that were available during my care of the patient were reviewed by me and considered in my medical decision making (see chart for details).      Final Clinical Impressions(s) / UC Diagnoses   Final diagnoses:  Crushing injury of right great toe, initial  encounter  Great toe pain, right   Patient presents today with concerns for right great toe pain and bruising following an injury earlier today.  She reports that she was exercising and a weight fell on her toe.  Physical exam was notable for mild swelling and bruising along the dorsal aspect of the right great toe.  Range of motion appears to be intact.  Patient appears to be neurovascularly intact with capillary refill less than 2 seconds at the distal aspect of the right great toe.  Imaging was negative for signs of acute fracture or dislocation per radiology interpretation.  At this time recommend home measures for symptomatic management including Tylenol  as needed for pain, elevation, cool compresses, rest as needed.  Return precautions reviewed and provided in after visit summary.  Follow-up as needed.    Discharge Instructions      You were seen today for concerns of right great toe pain following a crushing injury that occurred earlier today.  Your physical exam was overall reassuring and your imaging did not show evidence of a fracture or dislocation.  For now I recommend taking Tylenol  as needed for pain management, elevating your foot, applying cool compresses to the area for the next 48 hours.  For compresses I recommend applying for 15 minutes on and at least 30 minutes off to prevent tissue damage.  After the first 48 hours you can switch over to warm compresses to help speed recovery.  If you develop more severe swelling, bruising, more severe pain or difficulty moving your toe I recommend follow-up here or with orthopedics for further evaluation     ED Prescriptions   None    PDMP not reviewed this encounter.   Marylene Rocky BRAVO, PA-C 03/08/24 1422

## 2024-03-08 NOTE — ED Triage Notes (Addendum)
 Pt presents to urgent care with complaints of right great toe injury. Occurred today at approximately 10:30 AM. States she was working out and a weight fell on her right great toe. Bruising is present. Pt states she is on Plavix and Eliquis . Medications make her bruise easily. Rates pain a 5/10.

## 2024-03-08 NOTE — Discharge Instructions (Addendum)
 You were seen today for concerns of right great toe pain following a crushing injury that occurred earlier today.  Your physical exam was overall reassuring and your imaging did not show evidence of a fracture or dislocation.  For now I recommend taking Tylenol  as needed for pain management, elevating your foot, applying cool compresses to the area for the next 48 hours.  For compresses I recommend applying for 15 minutes on and at least 30 minutes off to prevent tissue damage.  After the first 48 hours you can switch over to warm compresses to help speed recovery.  If you develop more severe swelling, bruising, more severe pain or difficulty moving your toe I recommend follow-up here or with orthopedics for further evaluation

## 2024-03-10 DIAGNOSIS — Z85828 Personal history of other malignant neoplasm of skin: Secondary | ICD-10-CM | POA: Diagnosis not present

## 2024-03-10 DIAGNOSIS — L578 Other skin changes due to chronic exposure to nonionizing radiation: Secondary | ICD-10-CM | POA: Diagnosis not present

## 2024-03-10 DIAGNOSIS — D225 Melanocytic nevi of trunk: Secondary | ICD-10-CM | POA: Diagnosis not present

## 2024-03-10 DIAGNOSIS — D485 Neoplasm of uncertain behavior of skin: Secondary | ICD-10-CM | POA: Diagnosis not present

## 2024-03-10 DIAGNOSIS — L821 Other seborrheic keratosis: Secondary | ICD-10-CM | POA: Diagnosis not present

## 2024-03-15 ENCOUNTER — Encounter: Admitting: Internal Medicine

## 2024-03-16 ENCOUNTER — Telehealth: Payer: Self-pay | Admitting: Internal Medicine

## 2024-03-16 NOTE — Telephone Encounter (Signed)
 Good morning Dr. Federico, this patient called to schedule her colonoscopy procedure and she informed me that her BT medication has changed to Plavix. Patient also stated that she will be coming off of BT on September the 28 th. Would you please advise on how to schedule this patient either Directly or an OV.    Thank you.

## 2024-03-17 DIAGNOSIS — N200 Calculus of kidney: Secondary | ICD-10-CM | POA: Diagnosis not present

## 2024-03-28 DIAGNOSIS — K080 Exfoliation of teeth due to systemic causes: Secondary | ICD-10-CM | POA: Diagnosis not present

## 2024-04-25 DIAGNOSIS — M1711 Unilateral primary osteoarthritis, right knee: Secondary | ICD-10-CM | POA: Diagnosis not present

## 2024-05-02 DIAGNOSIS — M1711 Unilateral primary osteoarthritis, right knee: Secondary | ICD-10-CM | POA: Diagnosis not present

## 2024-05-09 DIAGNOSIS — M1711 Unilateral primary osteoarthritis, right knee: Secondary | ICD-10-CM | POA: Diagnosis not present

## 2024-05-24 ENCOUNTER — Encounter: Payer: Self-pay | Admitting: Pediatrics

## 2024-05-27 ENCOUNTER — Telehealth: Payer: Self-pay

## 2024-05-27 ENCOUNTER — Ambulatory Visit
Admission: RE | Admit: 2024-05-27 | Discharge: 2024-05-27 | Disposition: A | Discharge status: Home / Self Care | Source: Ambulatory Visit | Attending: Interventional Radiology | Admitting: Interventional Radiology

## 2024-05-27 DIAGNOSIS — D1771 Benign lipomatous neoplasm of kidney: Secondary | ICD-10-CM

## 2024-05-27 DIAGNOSIS — N200 Calculus of kidney: Secondary | ICD-10-CM | POA: Diagnosis not present

## 2024-05-27 MED ORDER — IOPAMIDOL (ISOVUE-300) INJECTION 61%
100.0000 mL | Freq: Once | INTRAVENOUS | Status: AC | PRN
  Administered 2024-05-27: 100 mL via INTRAVENOUS

## 2024-05-27 NOTE — Telephone Encounter (Signed)
 Patient is scheduled for colonoscopy on 06/30/2024 with Dr. Suzann.  Per chart she is on Eliquis  and Plavix. Please advise if OV is needed and would also need cardiac clearance. Thank you.

## 2024-05-27 NOTE — Telephone Encounter (Signed)
 Called patient & confirmed she is no longer on any blood thinner medication.

## 2024-06-02 ENCOUNTER — Ambulatory Visit
Admission: RE | Admit: 2024-06-02 | Discharge: 2024-06-02 | Disposition: A | Discharge status: Home / Self Care | Source: Ambulatory Visit | Attending: Interventional Radiology | Admitting: Interventional Radiology

## 2024-06-02 DIAGNOSIS — D1771 Benign lipomatous neoplasm of kidney: Secondary | ICD-10-CM

## 2024-06-02 HISTORY — PX: IR RADIOLOGIST EVAL & MGMT: IMG5224

## 2024-06-02 NOTE — Progress Notes (Signed)
 Chief Complaint: Patient was seen in consultation today for left renal AML at the request of Ann Vang  Referring Physician(s): Ann Vang  History of Present Illness: Ann Vang is a 78 y.o. 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  She underwent transarterial embolization via a left radial approach on 05/13/2017 and presents today for scheduled follow-up evaluation.   CT imaging dated 09/22/2019 demonstrates continued slight interval involution of the treated renal angiomyolipoma which now measures 4.9 x 3.2 x 5.2 cm.   CT imaging 10/01/21 - Post embolization changes with similar size and appearance of exophytic LEFT renal fat-containing mass, consistent with known AML.   CT 05/27/24 - Enhancing 2.8 cm anterior upper left renal mass with small foci of internal fat density, compatible with reported renal angiomyolipoma, stable from 10/27/23 CT, decreased from 03/10/23 CT    Unfortunately, in July of 2024 she developed some episodes of hematuria followed by acute onset of severe left flank pain requiring a visit to the emergency room.  Workup demonstrated a new acute bleed emanating from the left renal angiomyolipoma.  She required multiple units of transfusion and my partner, Dr. Majel, performed a renal angiogram which demonstrated recannulization of the previously placed coil pack with active extravasation of multiple small vessels.  He added some additional coils and was able to achieve hemostasis.   Ann Vang notes that she had been experiencing intermittent hematuria for several months prior to this event and had seen both her primary care physician and her urologist.  She had a CT scan performed in January of this year that demonstrated no further changes of her AML and additional workup was negative for a source of hematuria.  Clearly, these were warning signs of revascularization of the AML.   We had initially planned a repeat  angiogram in December, but she called and reported having developed some recurrent hematuria and therefore I scheduled her relatively urgently for a repeat left renal angiogram and embolization which was performed on 05/11/2023.  There was some persistent branches arising from the interpolar renal arteries.  I embolized these using a commendation of particles and subsidy a liquid embolic.  At the end of this embolization procedure, I saw no further opacification of AML branches.   She returns to clinic today with her husband.  She is now 1 year post repeat embolization and doing very well.  She has had no hematuria, pain or other issues.  She did get the Watchman device placed and is no longer on anticoagulation which is very good news.  She and her husband are soon moving to [place].     Past Medical History:  Diagnosis Date   Abnormal breast finding 12/1998   left breast abnl   Arthritis    --basal joint--right hand   Carpal tunnel syndrome, right    Compression fracture of T2 vertebra (HCC) 05/10/2019   Diverticulosis 2003   mild   Lumbar compression fracture (HCC) 07/13/2012   Lumbar disc disorder 2000   bulging   Melena    Menopause    Osteoporosis    Fx L4 age 34 water skiing accident   Palpitations    Thyroid  disease 10/2014   thyroid  nodules--bx'd large nodule and was benign--sees Gen. Careers adviser with [facility]    Past Surgical History:  Procedure Laterality Date   AUGMENTATION MAMMAPLASTY  1983   Implants have been removed   BREAST SURGERY  2010   implants removed ruptured  CERVICAL BIOPSY  W/ LOOP ELECTRODE EXCISION  1996   CIN   IR ANGIOGRAM SELECTIVE EACH ADDITIONAL VESSEL  03/10/2023   IR ANGIOGRAM SELECTIVE EACH ADDITIONAL VESSEL  03/10/2023   IR ANGIOGRAM SELECTIVE EACH ADDITIONAL VESSEL  03/10/2023   IR ANGIOGRAM SELECTIVE EACH ADDITIONAL VESSEL  05/11/2023   IR ANGIOGRAM SELECTIVE EACH ADDITIONAL VESSEL  05/11/2023   IR ANGIOGRAM SELECTIVE EACH ADDITIONAL VESSEL   05/11/2023   IR ANGIOGRAM VISCERAL SELECTIVE  05/11/2023   IR EMBO ART  VEN HEMORR LYMPH EXTRAV  INC GUIDE ROADMAPPING  03/10/2023   IR EMBO ART  VEN HEMORR LYMPH EXTRAV  INC GUIDE ROADMAPPING  05/11/2023   IR EMBO TUMOR ORGAN ISCHEMIA INFARCT INC GUIDE ROADMAPPING  05/13/2017   IR RADIOLOGIST EVAL & MGMT  04/15/2017   IR RADIOLOGIST EVAL & MGMT  05/27/2017   IR RADIOLOGIST EVAL & MGMT  01/21/2018   IR RADIOLOGIST EVAL & MGMT  09/28/2019   IR RADIOLOGIST EVAL & MGMT  10/07/2021   IR RADIOLOGIST EVAL & MGMT  03/31/2023   IR RADIOLOGIST EVAL & MGMT  06/09/2023   IR RADIOLOGIST EVAL & MGMT  06/02/2024   IR RENAL SELECTIVE  UNI INC S&I MOD SED  03/10/2023   IR RENAL SUPRASEL UNI S&I MOD SED  05/13/2017   IR RENAL SUPRASEL UNI S&I MOD SED  05/11/2023   IR RENAL SUPRASEL UNI S&I MOD SED  05/11/2023   IR US  GUIDE VASC ACCESS LEFT  05/13/2017   IR US  GUIDE VASC ACCESS RIGHT  03/10/2023   IR US  GUIDE VASC ACCESS RIGHT  05/11/2023   KNEE SURGERY  2012   meniscus rt knee (both)   L-2 fracture  2009   fall in aerobics   L-4 fracture   water skiing     TENOSYNOVECTOMY Right 03/11/2016   Procedure: TENOSYNOVECTOMY FLEXOR RIGHT RING FINGER;  Surgeon: Arley Curia, MD;  Location: [facility];  Service: Orthopedics;  Laterality: Right;   thyroid  nodule biopsy  10/2014   --[facility], [place]--   TONSILLECTOMY AND ADENOIDECTOMY  1964    Allergies: Patient has no known allergies.  Medications: Prior to Admission medications   Medication Sig Start Date End Date Taking? Authorizing Provider  apixaban  (ELIQUIS ) 5 MG TABS tablet Take 1 tablet (5 mg total) by mouth 2 (two) times daily. 09/07/23   Croitoru, Mihai, MD  Bacillus Coagulans-Inulin (PROBIOTIC) 1-250 BILLION-MG CAPS Take 1 capsule by mouth daily.    [provider]  Cholecalciferol  (VITAMIN D ) 2000 UNITS tablet Take 2,000 Units by mouth daily.    [provider]  estradiol  (VIVELLE -DOT) 0.0375 MG/24HR Place 1 patch onto the  skin 2 (two) times a week. 04/16/21   [provider]  fluticasone  (FLONASE ) 50 MCG/ACT nasal spray Place 2 sprays into the nose daily. Patient taking differently: Place 2 sprays into the nose daily as needed for allergies. 07/13/12   Eyvonne Debby ORN, MD  magnesium gluconate (MAGONATE) 500 MG tablet Take 500 mg by mouth 2 (two) times daily.    [provider]  milk thistle 175 MG tablet Take 175 mg by mouth daily.    [provider]  OVER THE COUNTER MEDICATION Take 1 tablet by mouth daily at 6 (six) AM. Medication: Vitamin K7    [provider]  PLAVIX 75 MG tablet  02/12/24   [provider]  progesterone  (PROMETRIUM ) 100 MG capsule Take 100 mg by mouth daily. 03/22/23   [provider]  SUMAtriptan HEZZIE)  50 MG tablet Take 50 mg by mouth every 2 (two) hours as needed for migraine. 03/30/23   [provider]     Family History  Problem Relation Age of Onset   Osteoporosis Mother    Alzheimer's disease Mother    Hypertension Father    Heart attack Father    Osteoporosis Maternal Grandmother    Colon cancer Maternal Grandmother    Colon cancer Other     Social History   Socioeconomic History   Marital status: Married    Spouse name: Not on file   Number of children: Not on file   Years of education: Not on file   Highest education level: Not on file  Occupational History   Not on file  Tobacco Use   Smoking status: Former    Current packs/day: 0.00    Types: Cigarettes    Quit date: 12/21/1972    Years since quitting: 51.4   Smokeless tobacco: Never  Vaping Use   Vaping status: Never Used  Substance and Sexual Activity   Alcohol  use: Yes    Alcohol /week: 14.0 standard drinks of alcohol     Types: 14 Glasses of wine per week   Drug use: No   Sexual activity: Yes    Partners: Male    Birth control/protection: Post-menopausal  Other Topics Concern   Not on file  Social History Narrative   Not on file    Social Drivers of Health   Financial Resource Strain: Not on file  Food Insecurity: Low Risk  (02/12/2024)   Received from [facility]   Hunger Vital Sign    Within the past 12 months, you worried that your food would run out before you got money to buy more: Never true    Within the past 12 months, the food you bought just didn't last and you didn't have money to get more. : Never true  Transportation Needs: No Transportation Needs (02/12/2024)   Received from [employer]    In the past 12 months, has lack of reliable transportation kept you from medical appointments, meetings, work or from getting things needed for daily living? : No  Physical Activity: Not on file  Stress: Not on file  Social Connections: Not on file    Review of Systems: A 12 point ROS discussed and pertinent positives are indicated in the HPI above.  All other systems are negative.  Review of Systems  Vital Signs: BP (!) 186/87 (BP Location: Left Arm, Patient Position: Sitting, Cuff Size: Normal)   Pulse 95   Temp 97.9 F (36.6 C) (Oral)   Resp 16   LMP 08/18/1994 (Approximate)   SpO2 99%     Physical Exam Constitutional:      General: She is not in acute distress.    Appearance: Normal appearance.  HENT:     Head: Normocephalic and atraumatic.  Eyes:     General: No scleral icterus. Cardiovascular:     Rate and Rhythm: Normal rate.  Pulmonary:     Effort: Pulmonary effort is normal.  Abdominal:     General: There is no distension.     Tenderness: There is no abdominal tenderness. There is no guarding.  Skin:    General: Skin is warm and dry.  Neurological:     Mental Status: She is alert and oriented to person, place, and time.  Psychiatric:        Mood and Affect: Mood normal.  Behavior: Behavior normal.      Imaging: IR Radiologist Eval & Mgmt Result Date: 06/02/2024 EXAM: ESTABLISHED PATIENT OFFICE VISIT CHIEF COMPLAINT: SEE NOTE IN EPIC HISTORY OF  PRESENT ILLNESS: SEE NOTE IN EPIC REVIEW OF SYSTEMS: SEE NOTE IN EPIC PHYSICAL EXAMINATION: SEE NOTE IN EPIC ASSESSMENT AND PLAN: SEE NOTE IN EPIC Electronically Signed   By: Wilkie Lent M.D.   On: 06/02/2024 13:12   CT ABDOMEN PELVIS W WO CONTRAST Result Date: 05/27/2024 EXAM: CT ABDOMEN WITHOUT AND WITH CONTRAST 05/27/2024 10:33:06 AM TECHNIQUE: CT of the abdomen was performed without and with the administration of intravenous contrast. Multiplanar reformatted images are provided for review. Automated exposure control, iterative reconstruction, and/or weight based adjustment of the mA/kV was utilized to reduce the radiation dose to as low as reasonably achievable. 100 mL of iopamidol  (ISOVUE -300) 61% injection was administered. 200 mL bottle, no waste. No renal insufficiency. COMPARISON: 10/27/2023 unenhanced CT abdomen/pelvis and 03/10/2023 enhanced CT abdomen/pelvis. CLINICAL HISTORY: Follow up for Renal angiomyolipoma. FINDINGS: LOWER CHEST: Irregular solid 0.9 cm anteromedial right middle lobe pulmonary nodule on series 8/image 3, not appreciably changed from 01/30/20 chest CT and presumably benign. Additional smaller solid pulmonary nodules at both lung bases are all stable. HEPATOBILIARY: A few scattered small simple liver cysts up to 1.3 cm in the anterior left liver with several subcentimeter hypodense lesions scattered throughout the liver but are too small to characterize, not appreciably changed from 03/10/2023 CT, considered benign. 2 stable small liver hemangiomas, largest 1.7 cm near the IVC in the segment 7 right liver on series 19/image 21. Gallbladder is unremarkable. SPLEEN: Spleen demonstrates no acute abnormality. PANCREAS: Pancreas demonstrates no acute abnormality. ADRENAL GLANDS: Adrenal glands demonstrate no acute abnormality. KIDNEYS: Punctate nonobstructing 1 to 2 mm scattered right renal stones. Nonobstructing 8 mm lower left renal stone. No hydronephrosis. Embolization coils  again noted in the upper left renal sinus. Enhancing 2.8 x 2.6 cm anterior upper left renal mass on series 19/image 53 with small foci of internal fat density compatible with reported angiomyolipoma, stable size from 10/27/2023 unenhanced CT, decreased significantly from 7.3 x 5.3 cm on 03/10/23 CT. No perinephric or periureteral stranding. GI AND BOWEL: Stomach and duodenal sweep demonstrate no acute abnormality. There is no bowel obstruction. No abnormal bowel wall thickening or distension. Mild colonic diverticulosis. PERITONEUM AND RETROPERITONEUM: No ascites or free air. Atherosclerotic nonaneurysmal abdominal aorta. LYMPH NODES: No lymphadenopathy. BONES AND SOFT TISSUES: Severe T12, moderate L2 and moderate L4 vertebral compression fractures. Moderate thoracolumbar spondylosis. No focal soft tissue abnormality. IMPRESSION: 1. Enhancing 2.8 cm anterior upper left renal mass with small foci of internal fat density, compatible with reported renal angiomyolipoma, stable from 10/27/23 CT, decreased from 03/10/23 CT. 2. Nonobstructing bilateral nephrolithiasis. Electronically signed by: Selinda Blue MD 05/27/2024 05:08 PM EDT RP Workstation: [id number]    Labs:  CBC: Recent Labs    10/27/23 0951  WBC 7.8  HGB 14.3  HCT 41.3  PLT 188    COAGS: No results for input(s): INR, APTT in the last 8760 hours.  BMP: Recent Labs    10/27/23 0951  NA 140  Vang 3.9  CL 108  CO2 21*  GLUCOSE 119*  BUN 27*  CALCIUM 9.0  CREATININE 1.24*  GFRNONAA 45*    LIVER FUNCTION TESTS: Recent Labs    10/27/23 0951  BILITOT 0.9  AST 36  ALT 27  ALKPHOS 47  PROT 6.7  ALBUMIN 3.8    TUMOR MARKERS: No results for input(s):  AFPTM, CEA, CA199, CHROMGRNA in the last 8760 hours.  Assessment and Plan:  Extremely pleasant 78 year old female with a history of a left renal angiomyolipoma which has now undergone 3 endovascular interventions with embolization.  She did have a spontaneous hemorrhage  this past July and has since recovered.   Her most recent embolization was on May 11, 2023.  At this point, we will continue conservative management.  She successfully had a Watchman device placed and is now no longer on anticoagulation which is a very positive development.  Further, she has had zero hematuria over the past year.    1.) Plan for repeat renal protocol CT scan with and without contrast of the abdomen and pelvis in October 2026 with a clinical visit to follow.   2.)  She is to let us  know if her hematuria becomes more significant and she sees any blood clots or her urine becomes dark red in color.  Electronically Signed: Wilkie MARLA Lent 06/02/2024, 1:21 PM   I spent a total of  15 Minutes in face to face in clinical consultation, greater than 50% of which was counseling/coordinating care for left renal AML

## 2024-06-14 DIAGNOSIS — Z23 Encounter for immunization: Secondary | ICD-10-CM | POA: Diagnosis not present

## 2024-06-16 ENCOUNTER — Ambulatory Visit

## 2024-06-16 ENCOUNTER — Encounter: Payer: Self-pay | Admitting: Pediatrics

## 2024-06-16 VITALS — Ht 67.0 in | Wt 162.0 lb

## 2024-06-16 DIAGNOSIS — I4891 Unspecified atrial fibrillation: Secondary | ICD-10-CM | POA: Diagnosis not present

## 2024-06-16 DIAGNOSIS — Z1211 Encounter for screening for malignant neoplasm of colon: Secondary | ICD-10-CM

## 2024-06-16 MED ORDER — NA SULFATE-K SULFATE-MG SULF 17.5-3.13-1.6 GM/177ML PO SOLN: 1.0000 | Freq: Once | ORAL | 0 refills | Status: AC

## 2024-06-16 NOTE — Progress Notes (Signed)
 No egg or soy allergy known to patient  No issues known to pt with past sedation with any surgeries or procedures Patient denies ever being told they had issues or difficulty with intubation  No FH of Malignant Hyperthermia Pt is not on diet pills Pt is not on  home 02  Pt is not on blood thinners  Pt denies issues with constipation  Does have A fib but had Watchman procedure and ablasion. No A flutter Have any cardiac testing pending--no Pt can ambulate independently Pt denies use of chewing tobacco Discussed diabetic I weight loss medication holds Discussed NSAID holds Checked BMI Pt instructed to use Single[employer]com or GoodRx for a price reduction on prep  Patient's chart reviewed by Norleen Schillings CNRA prior to previsit and patient appropriate for the [facility].  Pre visit completed and red dot placed by patient's name on their procedure day (on provider's schedule).

## 2024-06-28 NOTE — Progress Notes (Unsigned)
 [facility] History and Physical   Primary Care Physician:  Larnell Hamilton, MD   Reason for Procedure:  Colorectal cancer screening  Plan:    Colonoscopy   The patient was provided an opportunity to ask questions and all were answered. The patient agreed with the plan.   HPI: Ann Vang is a 78 y.o. female undergoing colonoscopy for colorectal cancer screening.  Patient last underwent colonoscopy in 2015 which was normal and showed diverticula throughout the colon.  She reports a pertinent family history of colorectal cancer in a great grandfather and a grandmother who had colon cancer and colon polyps.  Patient denies current symptoms of rectal bleeding or change in bowel habits.  Chart review documents that patient's anticoagulation has been discontinued as of October 2025.   Past Medical History:  Diagnosis Date   Abnormal breast finding 12/1998   left breast abnl   Allergy    Arthritis    --basal joint--right hand   Carpal tunnel syndrome, right    Compression fracture of T2 vertebra (HCC) 05/10/2019   Diverticulosis 2003   mild   Lumbar compression fracture (HCC) 07/13/2012   Lumbar disc disorder 2000   bulging   Melena    Menopause    Osteoporosis    Fx L4 age 68 water skiing accident   Palpitations    Thyroid  disease 10/2014   thyroid  nodules--bx'd large nodule and was benign--sees Gen. careers adviser with [facility]    Past Surgical History:  Procedure Laterality Date   AUGMENTATION MAMMAPLASTY  1983   Implants have been removed   BREAST SURGERY  2010   implants removed ruptured   CERVICAL BIOPSY  W/ LOOP ELECTRODE EXCISION  1996   CIN   IR ANGIOGRAM SELECTIVE EACH ADDITIONAL VESSEL  03/10/2023   IR ANGIOGRAM SELECTIVE EACH ADDITIONAL VESSEL  03/10/2023   IR ANGIOGRAM SELECTIVE EACH ADDITIONAL VESSEL  03/10/2023   IR ANGIOGRAM SELECTIVE EACH ADDITIONAL VESSEL  05/11/2023   IR ANGIOGRAM SELECTIVE EACH ADDITIONAL VESSEL  05/11/2023   IR ANGIOGRAM  SELECTIVE EACH ADDITIONAL VESSEL  05/11/2023   IR ANGIOGRAM VISCERAL SELECTIVE  05/11/2023   IR EMBO ART  VEN HEMORR LYMPH EXTRAV  INC GUIDE ROADMAPPING  03/10/2023   IR EMBO ART  VEN HEMORR LYMPH EXTRAV  INC GUIDE ROADMAPPING  05/11/2023   IR EMBO TUMOR ORGAN ISCHEMIA INFARCT INC GUIDE ROADMAPPING  05/13/2017   IR RADIOLOGIST EVAL & MGMT  04/15/2017   IR RADIOLOGIST EVAL & MGMT  05/27/2017   IR RADIOLOGIST EVAL & MGMT  01/21/2018   IR RADIOLOGIST EVAL & MGMT  09/28/2019   IR RADIOLOGIST EVAL & MGMT  10/07/2021   IR RADIOLOGIST EVAL & MGMT  03/31/2023   IR RADIOLOGIST EVAL & MGMT  06/09/2023   IR RADIOLOGIST EVAL & MGMT  06/02/2024   IR RENAL SELECTIVE  UNI INC S&I MOD SED  03/10/2023   IR RENAL SUPRASEL UNI S&I MOD SED  05/13/2017   IR RENAL SUPRASEL UNI S&I MOD SED  05/11/2023   IR RENAL SUPRASEL UNI S&I MOD SED  05/11/2023   IR US  GUIDE VASC ACCESS LEFT  05/13/2017   IR US  GUIDE VASC ACCESS RIGHT  03/10/2023   IR US  GUIDE VASC ACCESS RIGHT  05/11/2023   KNEE SURGERY  2012   meniscus rt knee (both)   L-2 fracture  2009   fall in aerobics   L-4 fracture   water skiing     TENOSYNOVECTOMY Right 03/11/2016   Procedure: TENOSYNOVECTOMY FLEXOR  RIGHT RING FINGER;  Surgeon: Arley Curia, MD;  Location: [facility];  Service: Orthopedics;  Laterality: Right;   thyroid  nodule biopsy  10/2014   --[facility], [place]--   TONSILLECTOMY AND ADENOIDECTOMY  1964    Prior to Admission medications   Medication Sig Start Date End Date Taking? Authorizing Provider  acetaminophen  (TYLENOL ) 500 MG tablet Take 1,000 mg by mouth.    [provider]  ASPIRIN 81 PO Aspirin 81    [provider]  Bacillus Coagulans-Inulin (PROBIOTIC) 1-250 BILLION-MG CAPS Take 1 capsule by mouth daily.    [provider]  Boron 3 MG CAPS Take 3 mg by mouth.    [provider]  Cholecalciferol  (VITAMIN D ) 2000 UNITS tablet Take 2,000 Units by mouth daily.    [provider]   estradiol  (VIVELLE -DOT) 0.0375 MG/24HR Place 1 patch onto the skin 2 (two) times a week. 04/16/21   [provider]  fluorouracil (EFUDEX) 5 % cream 1 Application. 12/14/19   [provider]  fluticasone  (FLONASE ) 50 MCG/ACT nasal spray Place 2 sprays into the nose daily. 07/13/12   Eyvonne Debby ORN, MD  magnesium gluconate (MAGONATE) 500 MG tablet Take 500 mg by mouth 2 (two) times daily.    [provider]  Milk Thistle 175 MG CAPS 1 tablet Orally once a day 03/19/23   [provider]  OVER THE COUNTER MEDICATION Take 1 tablet by mouth daily at 6 (six) AM. Medication: Vitamin K7    [provider]  PLAVIX 75 MG tablet  02/12/24   [provider]  progesterone  (PROMETRIUM ) 100 MG capsule Take 100 mg by mouth daily. 03/22/23   [provider]  SUMAtriptan (IMITREX) 50 MG tablet Take 50 mg by mouth every 2 (two) hours as needed for migraine. 03/30/23   [provider]    Current Outpatient Medications  Medication Sig Dispense Refill   acetaminophen  (TYLENOL ) 500 MG tablet Take 1,000 mg by mouth.     ASPIRIN 81 PO Aspirin 81     Boron 3 MG CAPS Take 3 mg by mouth.     Cholecalciferol  (VITAMIN D ) 2000 UNITS tablet Take 2,000 Units by mouth daily.     estradiol  (VIVELLE -DOT) 0.0375 MG/24HR Place 1 patch onto the skin 2 (two) times a week.     progesterone  (PROMETRIUM ) 100 MG capsule Take 100 mg by mouth daily.     Bacillus Coagulans-Inulin (PROBIOTIC) 1-250 BILLION-MG CAPS Take 1 capsule by mouth daily.     fluorouracil (EFUDEX) 5 % cream 1 Application.     fluticasone  (FLONASE ) 50 MCG/ACT nasal spray Place 2 sprays into the nose daily. 48 g 1   magnesium gluconate (MAGONATE) 500 MG tablet Take 500 mg by mouth 2 (two) times daily.     Milk Thistle 175 MG CAPS 1 tablet Orally once a day     OVER THE COUNTER MEDICATION Take 1 tablet by mouth daily at 6 (six) AM. Medication: Vitamin K7     PLAVIX 75 MG tablet  (Patient not taking:  Reported on 06/30/2024)     SUMAtriptan (IMITREX) 50 MG tablet Take 50 mg by mouth every 2 (two) hours as needed for migraine.     Current Facility-Administered Medications  Medication Dose Route Frequency Provider Last Rate Last Admin   0.9 %  sodium chloride  infusion  500 mL Intravenous Continuous Devonne Kitchen, Inocente HERO, MD        Allergies as of 06/30/2024   (No Known Allergies)  Family History  Problem Relation Age of Onset   Osteoporosis Mother    Alzheimer's disease Mother    Hypertension Father    Heart attack Father    Colon polyps Maternal Grandmother    Osteoporosis Maternal Grandmother    Colon cancer Maternal Grandmother    Colon polyps Other    Colon cancer Other    Esophageal cancer Neg Hx    Rectal cancer Neg Hx    Stomach cancer Neg Hx     Social History   Socioeconomic History   Marital status: Married    Spouse name: Not on file   Number of children: Not on file   Years of education: Not on file   Highest education level: Not on file  Occupational History   Not on file  Tobacco Use   Smoking status: Former    Current packs/day: 0.00    Types: Cigarettes    Quit date: 12/21/1972    Years since quitting: 51.5   Smokeless tobacco: Never  Vaping Use   Vaping status: Never Used  Substance and Sexual Activity   Alcohol  use: Yes    Alcohol /week: 14.0 standard drinks of alcohol     Types: 14 Glasses of wine per week   Drug use: No   Sexual activity: Yes    Partners: Male    Birth control/protection: Post-menopausal  Other Topics Concern   Not on file  Social History Narrative   Not on file   Social Drivers of Health   Financial Resource Strain: Not on file  Food Insecurity: Low Risk  (02/12/2024)   Received from [facility]   Hunger Vital Sign    Within the past 12 months, you worried that your food would run out before you got money to buy more: Never true    Within the past 12 months, the food you bought just didn't last and you didn't have  money to get more. : Never true  Transportation Needs: No Transportation Needs (02/12/2024)   Received from [employer]    In the past 12 months, has lack of reliable transportation kept you from medical appointments, meetings, work or from getting things needed for daily living? : No  Physical Activity: Not on file  Stress: Not on file  Social Connections: Not on file  Intimate Partner Violence: Not At Risk (03/11/2023)   Humiliation, Afraid, Rape, and Kick questionnaire    Fear of Current or Ex-Partner: No    Emotionally Abused: No    Physically Abused: No    Sexually Abused: No    Review of Systems:  All other review of systems negative except as mentioned in the HPI.  Physical Exam: Vital signs BP (!) 156/86   Pulse 88   Temp (!) 97.4 F (36.3 C) (Temporal)   Ht 5' 7 (1.702 m)   Wt 162 lb (73.5 kg)   LMP 08/18/1994 (Approximate)   SpO2 (!) 87%   BMI 25.37 kg/m   General:   Alert,  Well-developed, well-nourished, pleasant and cooperative in NAD Airway:  Mallampati 1 Lungs:  Clear throughout to auscultation.   Heart:  Regular rate and rhythm; no murmurs, clicks, rubs,  or gallops. Abdomen:  Soft, nontender and nondistended. Normal bowel sounds.   Neuro/Psych:  Normal mood and affect. A and O x 3  Inocente Hausen, MD [facility]

## 2024-06-30 ENCOUNTER — Ambulatory Visit (AMBULATORY_SURGERY_CENTER): Admitting: Pediatrics

## 2024-06-30 ENCOUNTER — Encounter: Payer: Self-pay | Admitting: Pediatrics

## 2024-06-30 VITALS — BP 132/89 | HR 74 | Temp 97.4°F | Resp 15 | Ht 67.0 in | Wt 162.0 lb

## 2024-06-30 DIAGNOSIS — K648 Other hemorrhoids: Secondary | ICD-10-CM

## 2024-06-30 DIAGNOSIS — K635 Polyp of colon: Secondary | ICD-10-CM

## 2024-06-30 DIAGNOSIS — Z1211 Encounter for screening for malignant neoplasm of colon: Secondary | ICD-10-CM | POA: Diagnosis present

## 2024-06-30 DIAGNOSIS — D123 Benign neoplasm of transverse colon: Secondary | ICD-10-CM

## 2024-06-30 DIAGNOSIS — Z800 Family history of malignant neoplasm of digestive organs: Secondary | ICD-10-CM

## 2024-06-30 DIAGNOSIS — K5140 Inflammatory polyps of colon without complications: Secondary | ICD-10-CM | POA: Diagnosis not present

## 2024-06-30 DIAGNOSIS — K5730 Diverticulosis of large intestine without perforation or abscess without bleeding: Secondary | ICD-10-CM | POA: Diagnosis not present

## 2024-06-30 MED ORDER — SODIUM CHLORIDE 0.9 % IV SOLN: 500.0000 mL | INTRAVENOUS | Status: DC

## 2024-06-30 NOTE — Progress Notes (Signed)
 Sedate, gd SR, tolerated procedure well, VSS, report to RN

## 2024-06-30 NOTE — Op Note (Signed)
 [facility] Patient Name: Ann Vang Procedure Date: 06/30/2024 7:07 AM MRN: [id number] Endoscopist: Inocente Hausen , MD, [id number] Age: 78 Referring MD:  Date of Birth: 1946/01/30 Gender: Female Account #: [id number] Procedure:                Colonoscopy Indications:              Screening for colorectal malignant neoplasm, Last                            colonoscopy: 2015; History of colorectal cancer in                            multiple distant relatives?great grandfather and                            grandmother Medicines:                Monitored Anesthesia Care Procedure:                Pre-Anesthesia Assessment:                           - Prior to the procedure, a History and Physical                            was performed, and patient medications and                            allergies were reviewed. The patient's tolerance of                            previous anesthesia was also reviewed. The risks                            and benefits of the procedure and the sedation                            options and risks were discussed with the patient.                            All questions were answered, and informed consent                            was obtained. Prior Anticoagulants: The patient has                            taken no anticoagulant or antiplatelet agents. ASA                            Grade Assessment: II - A patient with mild systemic                            disease. After reviewing the risks and benefits,  the patient was deemed in satisfactory condition to                            undergo the procedure.                           After obtaining informed consent, the colonoscope                            was passed under direct vision. Throughout the                            procedure, the patient's blood pressure, pulse, and                            oxygen saturations were monitored  continuously. The                            CF [id number] #[id number] was introduced through the anus                            and advanced to the cecum, identified by                            appendiceal orifice and ileocecal valve. The                            colonoscopy was technically difficult and complex                            due to restricted mobility of the sigmoid colon.                            Successful completion of the procedure was aided by                            withdrawing the scope and replacing with the                            pediatric colonoscope and ultimately an ultraslim                            pediatric colonoscope to negotiate a tight turn in                            the sigmoid colon. The patient tolerated the                            procedure well. The quality of the bowel                            preparation was adequate to identify polyps greater  than 5 mm in size. The ileocecal valve, appendiceal                            orifice, and rectum were photographed. Scope In: 8:18:21 AM Scope Out: 8:32:55 AM Scope Withdrawal Time: 0 hours 8 minutes 23 seconds  Total Procedure Duration: 0 hours 14 minutes 34 seconds  Findings:                 Hemorrhoids were found on perianal exam.                           The digital rectal exam was normal. Pertinent                            negatives include normal sphincter tone and no                            palpable rectal lesions.                           Multiple medium-mouthed and small-mouthed                            diverticula were found in the sigmoid colon,                            descending colon, transverse colon and ascending                            colon.                           A 7 mm polyp was found in the transverse colon. The                            polyp was sessile. The polyp was removed with a                            cold snare.  Resection and retrieval were complete.                           Internal hemorrhoids were found during retroflexion. Complications:            No immediate complications. Estimated blood loss:                            Minimal. Estimated Blood Loss:     Estimated blood loss was minimal. Impression:               - Hemorrhoids found on perianal exam.                           - Diverticulosis in the sigmoid colon, in the                            descending colon, in the transverse colon  and in                            the ascending colon.                           - One 7 mm polyp in the transverse colon, removed                            with a cold snare. Resected and retrieved.                           - Internal hemorrhoids. Recommendation:           - Discharge patient to home (ambulatory).                           - Await pathology results.                           - The findings and recommendations were discussed                            with the patient's family.                           - Return to referring physician.                           - Patient has a contact number available for                            emergencies. The signs and symptoms of potential                            delayed complications were discussed with the                            patient. Return to normal activities tomorrow.                            Written discharge instructions were provided to the                            patient. Inocente Hausen, MD 06/30/2024 8:42:21 AM This report has been signed electronically.

## 2024-06-30 NOTE — Patient Instructions (Signed)
 YOU HAD AN ENDOSCOPIC PROCEDURE TODAY AT THE [facility]:   Refer to the procedure report that was given to you for any specific questions about what was found during the examination.  If the procedure report does not answer your questions, please call your gastroenterologist to clarify.  If you requested that your care partner not be given the details of your procedure findings, then the procedure report has been included in a sealed envelope for you to review at your convenience later.  YOU SHOULD EXPECT: Some feelings of bloating in the abdomen. Passage of more gas than usual.  Walking can help get rid of the air that was put into your GI tract during the procedure and reduce the bloating. If you had a lower endoscopy (such as a colonoscopy or flexible sigmoidoscopy) you may notice spotting of blood in your stool or on the toilet paper. If you underwent a bowel prep for your procedure, you may not have a normal bowel movement for a few days.  Please Note:  You might notice some irritation and congestion in your nose or some drainage.  This is from the oxygen used during your procedure.  There is no need for concern and it should clear up in a day or so.  SYMPTOMS TO REPORT IMMEDIATELY:  Following lower endoscopy (colonoscopy or flexible sigmoidoscopy):  Excessive amounts of blood in the stool  Significant tenderness or worsening of abdominal pains  Swelling of the abdomen that is new, acute  Fever of 100F or higher  Resume previous diet Await pathology results Handouts on hemorrhoids, polyps and diverticulosis given  For urgent or emergent issues, a gastroenterologist can be reached at any hour by calling [phone]. Do not use MyChart messaging for urgent concerns.    DIET:  We do recommend a small meal at first, but then you may proceed to your regular diet.  Drink plenty of fluids but you should avoid alcoholic beverages for 24 hours.  ACTIVITY:  You should plan to  take it easy for the rest of today and you should NOT DRIVE or use heavy machinery until tomorrow (because of the sedation medicines used during the test).    FOLLOW UP: Our staff will call the number listed on your records the next business day following your procedure.  We will call around 7:15- 8:00 am to check on you and address any questions or concerns that you may have regarding the information given to you following your procedure. If we do not reach you, we will leave a message.     If any biopsies were taken you will be contacted by phone or by letter within the next 1-3 weeks.  Please call us  at [phone] if you have not heard about the biopsies in 3 weeks.    SIGNATURES/CONFIDENTIALITY: You and/or your care partner have signed paperwork which will be entered into your electronic medical record.  These signatures attest to the fact that that the information above on your After Visit Summary has been reviewed and is understood.  Full responsibility of the confidentiality of this discharge information lies with you and/or your care-partner.

## 2024-06-30 NOTE — Progress Notes (Signed)
 Called to room to assist during endoscopic procedure.  Patient ID and intended procedure confirmed with present staff. Received instructions for my participation in the procedure from the performing physician.

## 2024-06-30 NOTE — Progress Notes (Signed)
 Pt's states no medical or surgical changes since previsit or office visit.

## 2024-07-01 ENCOUNTER — Telehealth: Payer: Self-pay

## 2024-07-01 NOTE — Telephone Encounter (Signed)
  Follow up Call-     06/30/2024    7:09 AM  Call back number  Post procedure Call Back phone  # [phone]  Permission to leave phone message Yes     Patient questions:  Do you have a fever, pain , or abdominal swelling? No. Pain Score  0 *  Have you tolerated food without any problems? Yes.    Have you been able to return to your normal activities? Yes.    Do you have any questions about your discharge instructions: Diet   No. Medications  No. Follow up visit  No.  Do you have questions or concerns about your Care? No.  Actions: * If pain score is 4 or above: No action needed, pain <4.

## 2024-07-05 LAB — SURGICAL PATHOLOGY

## 2024-07-06 ENCOUNTER — Ambulatory Visit: Payer: Self-pay | Admitting: Pediatrics

## 2024-08-19 ENCOUNTER — Other Ambulatory Visit: Payer: Self-pay | Admitting: Internal Medicine

## 2024-08-19 DIAGNOSIS — Z1231 Encounter for screening mammogram for malignant neoplasm of breast: Secondary | ICD-10-CM

## 2024-08-24 ENCOUNTER — Encounter: Payer: Self-pay | Admitting: Cardiovascular Disease

## 2024-09-01 ENCOUNTER — Ambulatory Visit
Admission: RE | Admit: 2024-09-01 | Discharge: 2024-09-01 | Disposition: A | Source: Ambulatory Visit | Attending: Internal Medicine | Admitting: Internal Medicine

## 2024-09-01 DIAGNOSIS — Z1231 Encounter for screening mammogram for malignant neoplasm of breast: Secondary | ICD-10-CM

## 2024-09-22 ENCOUNTER — Other Ambulatory Visit: Payer: Self-pay | Admitting: Pediatrics

## 2024-09-22 DIAGNOSIS — Z1211 Encounter for screening for malignant neoplasm of colon: Secondary | ICD-10-CM
